# Patient Record
Sex: Female | Born: 1955
Health system: Southern US, Community
[De-identification: ages and names within clinical notes are randomized; demographics above are authoritative.]

## PROBLEM LIST (undated history)

## (undated) DIAGNOSIS — G8929 Other chronic pain: Secondary | ICD-10-CM

## (undated) DIAGNOSIS — E119 Type 2 diabetes mellitus without complications: Secondary | ICD-10-CM

## (undated) DIAGNOSIS — G5603 Carpal tunnel syndrome, bilateral upper limbs: Secondary | ICD-10-CM

## (undated) DIAGNOSIS — Z800 Family history of malignant neoplasm of digestive organs: Secondary | ICD-10-CM

## (undated) DIAGNOSIS — Z8709 Personal history of other diseases of the respiratory system: Secondary | ICD-10-CM

## (undated) DIAGNOSIS — Z801 Family history of malignant neoplasm of trachea, bronchus and lung: Secondary | ICD-10-CM

## (undated) DIAGNOSIS — J40 Bronchitis, not specified as acute or chronic: Secondary | ICD-10-CM

## (undated) DIAGNOSIS — E7800 Pure hypercholesterolemia, unspecified: Secondary | ICD-10-CM

## (undated) DIAGNOSIS — K581 Irritable bowel syndrome with constipation: Secondary | ICD-10-CM

## (undated) DIAGNOSIS — R102 Pelvic and perineal pain: Secondary | ICD-10-CM

## (undated) DIAGNOSIS — Z973 Presence of spectacles and contact lenses: Secondary | ICD-10-CM

## (undated) DIAGNOSIS — J45909 Unspecified asthma, uncomplicated: Secondary | ICD-10-CM

## (undated) DIAGNOSIS — G5600 Carpal tunnel syndrome, unspecified upper limb: Secondary | ICD-10-CM

## (undated) DIAGNOSIS — Z83719 Family history of colon polyps, unspecified: Secondary | ICD-10-CM

## (undated) DIAGNOSIS — K219 Gastro-esophageal reflux disease without esophagitis: Secondary | ICD-10-CM

## (undated) DIAGNOSIS — Z8041 Family history of malignant neoplasm of ovary: Secondary | ICD-10-CM

## (undated) DIAGNOSIS — Z8639 Personal history of other endocrine, nutritional and metabolic disease: Secondary | ICD-10-CM

## (undated) DIAGNOSIS — N83201 Unspecified ovarian cyst, right side: Secondary | ICD-10-CM

## (undated) DIAGNOSIS — E890 Postprocedural hypothyroidism: Secondary | ICD-10-CM

## (undated) DIAGNOSIS — M542 Cervicalgia: Secondary | ICD-10-CM

## (undated) DIAGNOSIS — Z8619 Personal history of other infectious and parasitic diseases: Secondary | ICD-10-CM

## (undated) DIAGNOSIS — Z8371 Family history of colonic polyps: Secondary | ICD-10-CM

## (undated) DIAGNOSIS — M797 Fibromyalgia: Secondary | ICD-10-CM

## (undated) DIAGNOSIS — Z972 Presence of dental prosthetic device (complete) (partial): Secondary | ICD-10-CM

## (undated) DIAGNOSIS — E079 Disorder of thyroid, unspecified: Secondary | ICD-10-CM

## (undated) DIAGNOSIS — Z803 Family history of malignant neoplasm of breast: Secondary | ICD-10-CM

## (undated) DIAGNOSIS — F329 Major depressive disorder, single episode, unspecified: Secondary | ICD-10-CM

## (undated) DIAGNOSIS — E785 Hyperlipidemia, unspecified: Secondary | ICD-10-CM

## (undated) HISTORY — PX: THYROIDECTOMY: SHX17

## (undated) HISTORY — DX: Bronchitis, not specified as acute or chronic: J40

## (undated) HISTORY — DX: Family history of colon polyps, unspecified: Z83.719

## (undated) HISTORY — DX: Family history of malignant neoplasm of breast: Z80.3

## (undated) HISTORY — DX: Gastro-esophageal reflux disease without esophagitis: K21.9

## (undated) HISTORY — DX: Family history of colonic polyps: Z83.71

## (undated) HISTORY — PX: COLONOSCOPY: SHX174

## (undated) HISTORY — DX: Family history of malignant neoplasm of trachea, bronchus and lung: Z80.1

## (undated) HISTORY — DX: Family history of malignant neoplasm of digestive organs: Z80.0

## (undated) HISTORY — PX: UPPER GASTROINTESTINAL ENDOSCOPY: SHX188

## (undated) HISTORY — DX: Family history of malignant neoplasm of ovary: Z80.41

---

## 1997-12-25 ENCOUNTER — Ambulatory Visit (HOSPITAL_COMMUNITY): Admission: RE | Admit: 1997-12-25 | Discharge: 1997-12-25 | Payer: Self-pay | Admitting: Family Medicine

## 1998-06-28 ENCOUNTER — Emergency Department (HOSPITAL_COMMUNITY): Admission: EM | Admit: 1998-06-28 | Discharge: 1998-06-28 | Payer: Self-pay | Admitting: Emergency Medicine

## 1998-07-22 ENCOUNTER — Other Ambulatory Visit: Admission: RE | Admit: 1998-07-22 | Discharge: 1998-07-22 | Payer: Self-pay | Admitting: Obstetrics

## 1998-08-06 ENCOUNTER — Encounter (HOSPITAL_BASED_OUTPATIENT_CLINIC_OR_DEPARTMENT_OTHER): Payer: Self-pay | Admitting: General Surgery

## 1998-08-06 ENCOUNTER — Ambulatory Visit (HOSPITAL_COMMUNITY): Admission: RE | Admit: 1998-08-06 | Discharge: 1998-08-06 | Payer: Self-pay | Admitting: General Surgery

## 1998-11-20 ENCOUNTER — Emergency Department (HOSPITAL_COMMUNITY): Admission: EM | Admit: 1998-11-20 | Discharge: 1998-11-20 | Payer: Self-pay | Admitting: *Deleted

## 1998-12-02 ENCOUNTER — Observation Stay (HOSPITAL_COMMUNITY): Admission: RE | Admit: 1998-12-02 | Discharge: 1998-12-03 | Payer: Self-pay | Admitting: *Deleted

## 1999-01-30 ENCOUNTER — Ambulatory Visit (HOSPITAL_COMMUNITY): Admission: RE | Admit: 1999-01-30 | Discharge: 1999-01-30 | Payer: Self-pay | Admitting: *Deleted

## 1999-01-30 ENCOUNTER — Encounter: Payer: Self-pay | Admitting: *Deleted

## 1999-06-21 ENCOUNTER — Emergency Department (HOSPITAL_COMMUNITY): Admission: EM | Admit: 1999-06-21 | Discharge: 1999-06-21 | Payer: Self-pay | Admitting: Emergency Medicine

## 1999-08-01 ENCOUNTER — Encounter (HOSPITAL_BASED_OUTPATIENT_CLINIC_OR_DEPARTMENT_OTHER): Payer: Self-pay | Admitting: General Surgery

## 1999-08-04 ENCOUNTER — Ambulatory Visit (HOSPITAL_COMMUNITY): Admission: RE | Admit: 1999-08-04 | Discharge: 1999-08-05 | Payer: Self-pay | Admitting: General Surgery

## 1999-08-04 ENCOUNTER — Encounter (INDEPENDENT_AMBULATORY_CARE_PROVIDER_SITE_OTHER): Payer: Self-pay | Admitting: *Deleted

## 1999-11-21 ENCOUNTER — Emergency Department (HOSPITAL_COMMUNITY): Admission: EM | Admit: 1999-11-21 | Discharge: 1999-11-21 | Payer: Self-pay

## 1999-12-08 ENCOUNTER — Ambulatory Visit (HOSPITAL_BASED_OUTPATIENT_CLINIC_OR_DEPARTMENT_OTHER): Admission: RE | Admit: 1999-12-08 | Discharge: 1999-12-08 | Payer: Self-pay

## 2000-03-31 ENCOUNTER — Ambulatory Visit (HOSPITAL_COMMUNITY): Admission: RE | Admit: 2000-03-31 | Discharge: 2000-03-31 | Payer: Self-pay | Admitting: Family Medicine

## 2000-03-31 ENCOUNTER — Encounter: Payer: Self-pay | Admitting: Family Medicine

## 2000-11-05 ENCOUNTER — Ambulatory Visit (HOSPITAL_COMMUNITY): Admission: RE | Admit: 2000-11-05 | Discharge: 2000-11-05 | Payer: Self-pay | Admitting: *Deleted

## 2000-11-16 ENCOUNTER — Ambulatory Visit (HOSPITAL_COMMUNITY): Admission: RE | Admit: 2000-11-16 | Discharge: 2000-11-16 | Payer: Self-pay | Admitting: *Deleted

## 2000-11-16 ENCOUNTER — Encounter: Payer: Self-pay | Admitting: *Deleted

## 2001-10-14 ENCOUNTER — Emergency Department (HOSPITAL_COMMUNITY): Admission: EM | Admit: 2001-10-14 | Discharge: 2001-10-14 | Payer: Self-pay | Admitting: Emergency Medicine

## 2002-02-08 ENCOUNTER — Other Ambulatory Visit: Admission: RE | Admit: 2002-02-08 | Discharge: 2002-02-08 | Payer: Self-pay | Admitting: *Deleted

## 2002-09-30 ENCOUNTER — Emergency Department (HOSPITAL_COMMUNITY): Admission: EM | Admit: 2002-09-30 | Discharge: 2002-09-30 | Payer: Self-pay | Admitting: Emergency Medicine

## 2002-12-11 ENCOUNTER — Emergency Department (HOSPITAL_COMMUNITY): Admission: EM | Admit: 2002-12-11 | Discharge: 2002-12-11 | Payer: Self-pay | Admitting: *Deleted

## 2003-06-20 ENCOUNTER — Encounter: Admission: RE | Admit: 2003-06-20 | Discharge: 2003-06-20 | Payer: Self-pay | Admitting: Internal Medicine

## 2003-06-21 ENCOUNTER — Other Ambulatory Visit: Admission: RE | Admit: 2003-06-21 | Discharge: 2003-06-21 | Payer: Self-pay | Admitting: Family Medicine

## 2004-02-05 ENCOUNTER — Ambulatory Visit: Payer: Self-pay | Admitting: Family Medicine

## 2004-02-13 ENCOUNTER — Ambulatory Visit: Payer: Self-pay | Admitting: Family Medicine

## 2004-02-15 ENCOUNTER — Ambulatory Visit: Payer: Self-pay | Admitting: *Deleted

## 2004-04-28 ENCOUNTER — Ambulatory Visit: Payer: Self-pay | Admitting: Family Medicine

## 2004-05-07 ENCOUNTER — Ambulatory Visit: Payer: Self-pay | Admitting: Family Medicine

## 2004-06-04 ENCOUNTER — Ambulatory Visit: Payer: Self-pay | Admitting: Family Medicine

## 2004-06-13 ENCOUNTER — Ambulatory Visit: Payer: Self-pay | Admitting: Family Medicine

## 2004-07-01 ENCOUNTER — Encounter: Admission: RE | Admit: 2004-07-01 | Discharge: 2004-07-01 | Payer: Self-pay | Admitting: Family Medicine

## 2004-08-05 ENCOUNTER — Ambulatory Visit: Payer: Self-pay | Admitting: Family Medicine

## 2004-10-10 ENCOUNTER — Ambulatory Visit: Payer: Self-pay | Admitting: Family Medicine

## 2004-12-17 ENCOUNTER — Ambulatory Visit: Payer: Self-pay | Admitting: Family Medicine

## 2005-02-08 ENCOUNTER — Emergency Department (HOSPITAL_COMMUNITY): Admission: EM | Admit: 2005-02-08 | Discharge: 2005-02-08 | Payer: Self-pay | Admitting: Emergency Medicine

## 2005-03-23 ENCOUNTER — Ambulatory Visit: Payer: Self-pay | Admitting: Family Medicine

## 2005-03-23 ENCOUNTER — Encounter (INDEPENDENT_AMBULATORY_CARE_PROVIDER_SITE_OTHER): Payer: Self-pay | Admitting: Family Medicine

## 2005-03-23 LAB — CONVERTED CEMR LAB
AST: 16 units/L
BUN: 13 mg/dL
Chloride, Serum: 106 mmol/L
Creatinine, Ser: 1 mg/dL
Potassium, serum: 4.1 mmol/L
Sodium, serum: 140 mmol/L

## 2005-04-08 ENCOUNTER — Ambulatory Visit: Payer: Self-pay | Admitting: Family Medicine

## 2005-05-28 ENCOUNTER — Ambulatory Visit: Payer: Self-pay | Admitting: Family Medicine

## 2005-06-08 ENCOUNTER — Ambulatory Visit: Payer: Self-pay | Admitting: Family Medicine

## 2005-06-30 ENCOUNTER — Encounter (INDEPENDENT_AMBULATORY_CARE_PROVIDER_SITE_OTHER): Payer: Self-pay | Admitting: *Deleted

## 2005-06-30 ENCOUNTER — Ambulatory Visit: Payer: Self-pay | Admitting: Family Medicine

## 2005-10-26 ENCOUNTER — Ambulatory Visit: Payer: Self-pay | Admitting: Sports Medicine

## 2005-12-09 ENCOUNTER — Encounter (INDEPENDENT_AMBULATORY_CARE_PROVIDER_SITE_OTHER): Payer: Self-pay | Admitting: *Deleted

## 2005-12-10 ENCOUNTER — Ambulatory Visit: Payer: Self-pay | Admitting: Family Medicine

## 2005-12-16 ENCOUNTER — Ambulatory Visit (HOSPITAL_COMMUNITY): Admission: RE | Admit: 2005-12-16 | Discharge: 2005-12-16 | Payer: Self-pay | Admitting: Internal Medicine

## 2006-03-22 ENCOUNTER — Ambulatory Visit: Payer: Self-pay | Admitting: Family Medicine

## 2006-05-10 ENCOUNTER — Emergency Department (HOSPITAL_COMMUNITY): Admission: EM | Admit: 2006-05-10 | Discharge: 2006-05-10 | Payer: Self-pay | Admitting: Emergency Medicine

## 2006-05-17 ENCOUNTER — Encounter (INDEPENDENT_AMBULATORY_CARE_PROVIDER_SITE_OTHER): Payer: Self-pay | Admitting: Family Medicine

## 2006-05-17 ENCOUNTER — Ambulatory Visit: Payer: Self-pay | Admitting: Family Medicine

## 2006-06-24 ENCOUNTER — Emergency Department (HOSPITAL_COMMUNITY): Admission: EM | Admit: 2006-06-24 | Discharge: 2006-06-24 | Payer: Self-pay | Admitting: Emergency Medicine

## 2006-07-08 DIAGNOSIS — IMO0001 Reserved for inherently not codable concepts without codable children: Secondary | ICD-10-CM | POA: Insufficient documentation

## 2006-07-08 DIAGNOSIS — K219 Gastro-esophageal reflux disease without esophagitis: Secondary | ICD-10-CM | POA: Insufficient documentation

## 2006-07-08 DIAGNOSIS — E039 Hypothyroidism, unspecified: Secondary | ICD-10-CM | POA: Insufficient documentation

## 2006-07-09 ENCOUNTER — Encounter (INDEPENDENT_AMBULATORY_CARE_PROVIDER_SITE_OTHER): Payer: Self-pay | Admitting: *Deleted

## 2006-08-05 ENCOUNTER — Telehealth: Payer: Self-pay | Admitting: *Deleted

## 2006-08-09 ENCOUNTER — Ambulatory Visit: Payer: Self-pay | Admitting: Family Medicine

## 2006-08-09 DIAGNOSIS — L42 Pityriasis rosea: Secondary | ICD-10-CM | POA: Insufficient documentation

## 2006-09-08 ENCOUNTER — Telehealth (INDEPENDENT_AMBULATORY_CARE_PROVIDER_SITE_OTHER): Payer: Self-pay | Admitting: Family Medicine

## 2006-09-10 ENCOUNTER — Ambulatory Visit: Payer: Self-pay | Admitting: Family Medicine

## 2006-09-10 ENCOUNTER — Encounter (INDEPENDENT_AMBULATORY_CARE_PROVIDER_SITE_OTHER): Payer: Self-pay | Admitting: Family Medicine

## 2006-10-18 ENCOUNTER — Telehealth: Payer: Self-pay | Admitting: *Deleted

## 2006-10-20 ENCOUNTER — Ambulatory Visit: Payer: Self-pay | Admitting: Family Medicine

## 2006-11-01 ENCOUNTER — Encounter (INDEPENDENT_AMBULATORY_CARE_PROVIDER_SITE_OTHER): Payer: Self-pay | Admitting: Family Medicine

## 2006-12-08 ENCOUNTER — Telehealth: Payer: Self-pay | Admitting: *Deleted

## 2006-12-09 ENCOUNTER — Ambulatory Visit: Payer: Self-pay | Admitting: Family Medicine

## 2006-12-09 LAB — CONVERTED CEMR LAB
Nitrite: NEGATIVE
Protein, U semiquant: 30
Urobilinogen, UA: 0.2
WBC Urine, dipstick: NEGATIVE

## 2006-12-10 ENCOUNTER — Encounter: Payer: Self-pay | Admitting: Family Medicine

## 2006-12-11 ENCOUNTER — Encounter: Payer: Self-pay | Admitting: Family Medicine

## 2007-03-22 ENCOUNTER — Encounter (INDEPENDENT_AMBULATORY_CARE_PROVIDER_SITE_OTHER): Payer: Self-pay | Admitting: Family Medicine

## 2007-03-29 ENCOUNTER — Encounter (INDEPENDENT_AMBULATORY_CARE_PROVIDER_SITE_OTHER): Payer: Self-pay | Admitting: Family Medicine

## 2007-04-15 ENCOUNTER — Ambulatory Visit (HOSPITAL_COMMUNITY): Admission: RE | Admit: 2007-04-15 | Discharge: 2007-04-15 | Payer: Self-pay | Admitting: Family Medicine

## 2007-04-15 ENCOUNTER — Encounter (INDEPENDENT_AMBULATORY_CARE_PROVIDER_SITE_OTHER): Payer: Self-pay | Admitting: *Deleted

## 2007-04-15 ENCOUNTER — Ambulatory Visit: Payer: Self-pay | Admitting: Family Medicine

## 2007-04-15 DIAGNOSIS — K921 Melena: Secondary | ICD-10-CM | POA: Insufficient documentation

## 2007-04-16 ENCOUNTER — Encounter (INDEPENDENT_AMBULATORY_CARE_PROVIDER_SITE_OTHER): Payer: Self-pay | Admitting: *Deleted

## 2007-04-16 LAB — CONVERTED CEMR LAB
Hemoglobin: 14.5 g/dL (ref 12.0–15.0)
MCHC: 31.7 g/dL (ref 30.0–36.0)
MCV: 90 fL (ref 78.0–100.0)
RBC: 5.09 M/uL (ref 3.87–5.11)
TSH: 0.618 microintl units/mL (ref 0.350–5.50)
WBC: 9.3 10*3/uL (ref 4.0–10.5)

## 2007-04-18 ENCOUNTER — Telehealth: Payer: Self-pay | Admitting: *Deleted

## 2007-04-19 ENCOUNTER — Encounter (INDEPENDENT_AMBULATORY_CARE_PROVIDER_SITE_OTHER): Payer: Self-pay | Admitting: Family Medicine

## 2007-04-22 ENCOUNTER — Ambulatory Visit: Payer: Self-pay

## 2007-04-22 ENCOUNTER — Ambulatory Visit: Payer: Self-pay | Admitting: Family Medicine

## 2007-04-22 DIAGNOSIS — Z860100 Personal history of colon polyps, unspecified: Secondary | ICD-10-CM | POA: Insufficient documentation

## 2007-04-22 DIAGNOSIS — K625 Hemorrhage of anus and rectum: Secondary | ICD-10-CM | POA: Insufficient documentation

## 2007-04-27 ENCOUNTER — Ambulatory Visit (HOSPITAL_COMMUNITY): Admission: RE | Admit: 2007-04-27 | Discharge: 2007-04-27 | Payer: Self-pay | Admitting: Family Medicine

## 2007-04-29 ENCOUNTER — Encounter (INDEPENDENT_AMBULATORY_CARE_PROVIDER_SITE_OTHER): Payer: Self-pay | Admitting: Family Medicine

## 2007-05-27 ENCOUNTER — Ambulatory Visit: Payer: Self-pay | Admitting: Family Medicine

## 2007-05-27 ENCOUNTER — Encounter: Payer: Self-pay | Admitting: Family Medicine

## 2007-05-27 LAB — CONVERTED CEMR LAB
Glucose, Urine, Semiquant: NEGATIVE
Ketones, urine, test strip: NEGATIVE
Nitrite: NEGATIVE
Specific Gravity, Urine: 1.025
WBC Urine, dipstick: NEGATIVE
pH: 5.5

## 2007-09-09 ENCOUNTER — Emergency Department (HOSPITAL_COMMUNITY): Admission: EM | Admit: 2007-09-09 | Discharge: 2007-09-09 | Payer: Self-pay | Admitting: Emergency Medicine

## 2008-01-17 ENCOUNTER — Telehealth: Payer: Self-pay | Admitting: *Deleted

## 2008-01-17 ENCOUNTER — Ambulatory Visit: Payer: Self-pay | Admitting: Family Medicine

## 2008-01-19 ENCOUNTER — Telehealth: Payer: Self-pay | Admitting: Family Medicine

## 2008-03-26 ENCOUNTER — Telehealth: Payer: Self-pay | Admitting: *Deleted

## 2008-03-26 ENCOUNTER — Ambulatory Visit: Payer: Self-pay | Admitting: Family Medicine

## 2008-03-26 ENCOUNTER — Encounter: Payer: Self-pay | Admitting: Family Medicine

## 2008-03-26 LAB — CONVERTED CEMR LAB
Bilirubin Urine: NEGATIVE
Chlamydia, DNA Probe: NEGATIVE
Glucose, Urine, Semiquant: NEGATIVE
Protein, U semiquant: NEGATIVE
Urobilinogen, UA: 0.2
pH: 5.5

## 2008-04-20 ENCOUNTER — Telehealth: Payer: Self-pay | Admitting: *Deleted

## 2008-04-23 ENCOUNTER — Telehealth: Payer: Self-pay | Admitting: *Deleted

## 2008-05-21 ENCOUNTER — Ambulatory Visit (HOSPITAL_COMMUNITY): Admission: RE | Admit: 2008-05-21 | Discharge: 2008-05-21 | Payer: Self-pay | Admitting: Family Medicine

## 2008-05-23 ENCOUNTER — Telehealth (INDEPENDENT_AMBULATORY_CARE_PROVIDER_SITE_OTHER): Payer: Self-pay | Admitting: *Deleted

## 2008-05-25 ENCOUNTER — Encounter: Admission: RE | Admit: 2008-05-25 | Discharge: 2008-05-25 | Payer: Self-pay | Admitting: Family Medicine

## 2008-05-27 ENCOUNTER — Emergency Department (HOSPITAL_COMMUNITY): Admission: EM | Admit: 2008-05-27 | Discharge: 2008-05-27 | Payer: Self-pay | Admitting: Emergency Medicine

## 2008-05-28 ENCOUNTER — Encounter: Admission: RE | Admit: 2008-05-28 | Discharge: 2008-05-28 | Payer: Self-pay | Admitting: Family Medicine

## 2008-06-27 ENCOUNTER — Ambulatory Visit: Payer: Self-pay | Admitting: Family Medicine

## 2008-07-11 ENCOUNTER — Encounter: Payer: Self-pay | Admitting: Internal Medicine

## 2008-10-23 ENCOUNTER — Telehealth: Payer: Self-pay | Admitting: *Deleted

## 2008-10-26 ENCOUNTER — Ambulatory Visit: Payer: Self-pay | Admitting: Family Medicine

## 2008-10-26 ENCOUNTER — Encounter (INDEPENDENT_AMBULATORY_CARE_PROVIDER_SITE_OTHER): Payer: Self-pay | Admitting: Family Medicine

## 2008-10-30 LAB — CONVERTED CEMR LAB
AST: 19 units/L (ref 0–37)
Albumin: 3.9 g/dL (ref 3.5–5.2)
BUN: 13 mg/dL (ref 6–23)
CO2: 25 meq/L (ref 19–32)
Calcium: 9.3 mg/dL (ref 8.4–10.5)
Chloride: 108 meq/L (ref 96–112)
Cholesterol: 258 mg/dL — ABNORMAL HIGH (ref 0–200)
Glucose, Bld: 126 mg/dL — ABNORMAL HIGH (ref 70–99)
HDL: 46 mg/dL (ref >39)
Potassium: 4.5 meq/L (ref 3.5–5.3)
TSH: 14.642 microintl units/mL — ABNORMAL HIGH (ref 0.350–4.500)

## 2008-11-05 ENCOUNTER — Telehealth: Payer: Self-pay | Admitting: Family Medicine

## 2008-11-14 ENCOUNTER — Telehealth: Payer: Self-pay | Admitting: *Deleted

## 2008-12-14 ENCOUNTER — Encounter: Payer: Self-pay | Admitting: Family Medicine

## 2008-12-14 ENCOUNTER — Ambulatory Visit: Payer: Self-pay | Admitting: Family Medicine

## 2008-12-14 DIAGNOSIS — E119 Type 2 diabetes mellitus without complications: Secondary | ICD-10-CM | POA: Insufficient documentation

## 2008-12-14 DIAGNOSIS — E785 Hyperlipidemia, unspecified: Secondary | ICD-10-CM | POA: Insufficient documentation

## 2008-12-14 DIAGNOSIS — R0602 Shortness of breath: Secondary | ICD-10-CM | POA: Insufficient documentation

## 2008-12-15 ENCOUNTER — Telehealth: Payer: Self-pay | Admitting: Family Medicine

## 2008-12-17 ENCOUNTER — Telehealth: Payer: Self-pay | Admitting: Family Medicine

## 2009-02-24 ENCOUNTER — Emergency Department (HOSPITAL_COMMUNITY): Admission: EM | Admit: 2009-02-24 | Discharge: 2009-02-24 | Payer: Self-pay | Admitting: Emergency Medicine

## 2009-03-07 ENCOUNTER — Telehealth: Payer: Self-pay | Admitting: *Deleted

## 2009-03-08 ENCOUNTER — Ambulatory Visit: Payer: Self-pay | Admitting: Family Medicine

## 2009-03-20 ENCOUNTER — Encounter: Payer: Self-pay | Admitting: Family Medicine

## 2009-05-13 ENCOUNTER — Encounter (INDEPENDENT_AMBULATORY_CARE_PROVIDER_SITE_OTHER): Payer: Self-pay | Admitting: *Deleted

## 2009-05-13 DIAGNOSIS — Z720 Tobacco use: Secondary | ICD-10-CM | POA: Insufficient documentation

## 2009-05-13 DIAGNOSIS — F17200 Nicotine dependence, unspecified, uncomplicated: Secondary | ICD-10-CM | POA: Insufficient documentation

## 2009-05-17 ENCOUNTER — Ambulatory Visit (HOSPITAL_COMMUNITY): Admission: RE | Admit: 2009-05-17 | Discharge: 2009-05-17 | Payer: Self-pay | Admitting: Family Medicine

## 2009-05-17 ENCOUNTER — Encounter: Payer: Self-pay | Admitting: Family Medicine

## 2009-05-17 ENCOUNTER — Ambulatory Visit: Payer: Self-pay | Admitting: Family Medicine

## 2009-05-17 DIAGNOSIS — M25529 Pain in unspecified elbow: Secondary | ICD-10-CM | POA: Insufficient documentation

## 2009-05-17 LAB — CONVERTED CEMR LAB: Hgb A1c MFr Bld: 7 %

## 2009-05-20 ENCOUNTER — Ambulatory Visit: Payer: Self-pay | Admitting: Family Medicine

## 2009-05-20 ENCOUNTER — Encounter: Payer: Self-pay | Admitting: Family Medicine

## 2009-05-26 ENCOUNTER — Encounter: Payer: Self-pay | Admitting: Family Medicine

## 2009-07-30 ENCOUNTER — Telehealth: Payer: Self-pay | Admitting: Family Medicine

## 2009-08-23 ENCOUNTER — Encounter: Payer: Self-pay | Admitting: Family Medicine

## 2009-08-23 ENCOUNTER — Ambulatory Visit: Payer: Self-pay | Admitting: Family Medicine

## 2009-08-23 DIAGNOSIS — M542 Cervicalgia: Secondary | ICD-10-CM | POA: Insufficient documentation

## 2009-08-23 DIAGNOSIS — M79609 Pain in unspecified limb: Secondary | ICD-10-CM | POA: Insufficient documentation

## 2009-08-23 LAB — CONVERTED CEMR LAB
ALT: 22 units/L (ref 0–35)
AST: 15 units/L (ref 0–37)
Bilirubin, Direct: 0.1 mg/dL (ref 0.0–0.3)
Direct LDL: 124 mg/dL — ABNORMAL HIGH
Indirect Bilirubin: 0.2 mg/dL (ref 0.0–0.9)

## 2009-08-26 ENCOUNTER — Encounter: Payer: Self-pay | Admitting: Family Medicine

## 2009-10-25 ENCOUNTER — Ambulatory Visit: Payer: Self-pay | Admitting: Family Medicine

## 2009-10-25 ENCOUNTER — Encounter: Payer: Self-pay | Admitting: Family Medicine

## 2009-10-25 DIAGNOSIS — R300 Dysuria: Secondary | ICD-10-CM | POA: Insufficient documentation

## 2009-10-25 DIAGNOSIS — R1011 Right upper quadrant pain: Secondary | ICD-10-CM | POA: Insufficient documentation

## 2009-10-25 LAB — CONVERTED CEMR LAB
Nitrite: NEGATIVE
WBC Urine, dipstick: NEGATIVE

## 2009-10-31 ENCOUNTER — Encounter: Payer: Self-pay | Admitting: Family Medicine

## 2009-10-31 LAB — CONVERTED CEMR LAB
AST: 15 units/L (ref 0–37)
Albumin: 4.6 g/dL (ref 3.5–5.2)
Alkaline Phosphatase: 64 units/L (ref 39–117)
Potassium: 4.6 meq/L (ref 3.5–5.3)
Sodium: 140 meq/L (ref 135–145)
Total Protein: 7.5 g/dL (ref 6.0–8.3)

## 2009-11-21 ENCOUNTER — Encounter: Payer: Self-pay | Admitting: Family Medicine

## 2010-06-08 LAB — CONVERTED CEMR LAB
Cholesterol: 257 mg/dL — ABNORMAL HIGH (ref 0–200)
Total CHOL/HDL Ratio: 5.5
Triglycerides: 127 mg/dL (ref <150)

## 2010-06-12 NOTE — Assessment & Plan Note (Signed)
Summary: ulnar elbow/hand pain, DM, SOB, etc...   Vital Signs:  Patient profile:   55 year old female Weight:      166.63 pounds Temp:     97.9 degrees F oral Pulse rate:   77 / minute Pulse rhythm:   regular BP sitting:   121 / 84  (right arm) Cuff size:   regular  Vitals Entered By: Loralee Pacas CMA (May 17, 2009 11:14 AM) CC: ulnar elbow/hand pain bilaterally, DM, COPD, Neuropathy  Pain Assessment Patient in pain? yes     Location: chest Intensity: 5 Type: burning Onset of pain  Gradual Comments pt states she's been having shooting pains that start in finger tips, into her shoulder and goes into her chest. no heavy lifting or strenuous exercise, no stressful situations.     Primary Care Provider:  Delbert Harness MD  CC:  ulnar elbow/hand pain bilaterally, DM, COPD, and Neuropathy .  History of Present Illness: Ulnar elbow/wrist pain bilaterlly: Pt has bilateal ulnar sided elbow and hand pain. 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 she has noticed some tingling in her fingers, some ulnar pain in her elbow, some pain in her 4th and 5th fingers. She says it is worse when she lifts her hands above her head to work with her hair. She also says it has woken her up at night. She has used ice pack Biofreeze and warm water soaks.   DM2: Pt has been taking Metformin once daily and says even then she forgets to take it sometimes. She is not checking her CBG's regularily. She says she forgets she has DM most of the time until she starts to feel bad and then she takes a Metformin pill.   SOB: Pt is still smokoing about 1/2 ppd. She has a h/o COPDShe is having some SOB occasionally and has to use the inhaler. She uses her inhaler about 2-3x/week. she was couseled on smoking but is not ready to start the smoking cessation class yet.    Fibromyalgia and Neuopathic pain: Pt has some symptoms of neurpathic pain. She has some burning pains in her left trapezious  and along her hair line. She also has fibromyalgia pains that are supose to be treated with the Neurontin. She does not take it regularily.   Habits & Providers  Alcohol-Tobacco-Diet     Tobacco Status: current     Tobacco Counseling: to quit use of tobacco products     Cigarette Packs/Day: 0.5  Current Medications (verified): 1)  Levothroid 175 Mcg Tabs (Levothyroxine Sodium) .Marland Kitchen.. 1 Tablet By Mouth Once A Day, Take 30 Minutes Before Food or Other Meds. 2)  Ranitidine Hcl 150 Mg Caps (Ranitidine Hcl) .... Take One Pill Twice Daily 3)  Ventolin Hfa 108 (90 Base) Mcg/act Aers (Albuterol Sulfate) .... Sig: 2 Puffs By Mouth Every 4-6 Hours As Needed Disp 1 Hfa 4)  Neurontin 100 Mg Caps (Gabapentin) .Marland Kitchen.. 1 Tablet By Mouth At Bedtime For Nerve Pain 5)  Metformin Hcl 500 Mg Tabs (Metformin Hcl) .... Take One Pill With Breakfast and One With Dinner 6)  Itch Relief 2 % Crea (Diphenhydramine Hcl) .... Apply Cream To Ichy Area of Skin 1 Large Tube 7)  Ibuprofen 600 Mg Tabs (Ibuprofen) .... Take One Pill Every 6 Hours To Decrease Inflamation in Your Joints.  Allergies (verified): 1)  Sulfamethoxazole (Sulfamethoxazole)  Review of Systems        vitals reviewed and pertinent negatives and positives  seen in HPI   Physical Exam  General:  Well-developed,well-nourished,in no acute distress; alert,appropriate and cooperative throughout examination Head:  hair under wig is broken but tightly braided into rows.  Lungs:  Normal respiratory effort, chest expands symmetrically. Lungs are clear to auscultation, no crackles or wheezes. Heart:  Normal rate and regular rhythm. S1 and S2 normal without gallop, murmur, click, rub or other extra sounds. Extremities:  tenderness at elbow on medial side bilaterally  Psych:  Cognition and judgment appear intact. Alert and cooperative with normal attention span and concentration. No apparent delusions, illusions, hallucinations   Impression &  Recommendations:  Problem # 1:  ELBOW PAIN, BILATERAL (ICD-719.42) Assessment New Pt is to wrap her elbows in ACE bandages (give to her from our clinic) at night to keep her arms straight so she does not compress them by bending them at night. Pt is to also take Ibuprofen 600 mg daily to decrease inflammation. I will see her in a month or two.   Orders: [facility]- Est  Level 4 ([address])  Problem # 2:  DIABETES MELLITUS, TYPE II, WITHOUT COMPLICATIONS (ICD-250.00) Assessment: Deteriorated Pt as not been taking metformin as prescribed and her A1c is 7.0 (was 6.9) so it is slightly worse. She was encouraged to take her metformin regulalriy.   Her updated medication list for this problem includes:    Metformin Hcl 500 Mg Tabs (Metformin hcl) .Marland Kitchen... Take one pill with breakfast and one with dinner  Orders: A1C-[facility] ([address]) [facility]- Est  Level 4 ([address]214)  Problem # 3:  SHORTNESS OF BREATH (ICD-786.05) Assessment: Unchanged Pt says that she has SOB about 3 times a week and has to use the inhaler. She had an EKG that was normal. She got a refill on her Albuterol   Orders: EKG- [facility] (EKG) [facility]- Est  Level 4 ([address])  Problem # 4:  FIBROMYALGIA, FIBROMYOSITIS (ICD-729.1) Assessment: Deteriorated Pt says that she has burning pain in her left trapezius that comes and goes. She also has pain in her rt leg and some burning pain around her face. These are unusual symptoms but I encouraged pt to use the Neurontin as prescribed.   The following medications were removed from the medication list:    Ibu 800 Mg Tabs (Ibuprofen) .Marland Kitchen... 1 tab by mouth three times a day as needed pain Her updated medication list for this problem includes:    Ibuprofen 600 Mg Tabs (Ibuprofen) .Marland Kitchen... Take one pill every 6 hours to decrease inflamation in your joints.  Orders: [facility]- Est  Level 4 ([address])  Problem # 5:  TOBACCO USER (ICD-305.1) pt continues to smoke. Encouraged to go to smoking cessation classes.   Problem # 6:   DYSLIPIDEMIA (ICD-272.4) Assessment: Comment Only Pt encouaged to come back to get a FLP and if her LDL is still elevated pt knows we will start a STatin at that time.   Orders: [facility]- Est  Level 4 ([address])Future Orders: Lipid-[facility] ([id number]) ... 05/28/2010  Complete Medication List: 1)  Levothroid 175 Mcg Tabs (Levothyroxine sodium) .Marland Kitchen.. 1 tablet by mouth once a day, take 30 minutes before food or other meds. 2)  Ranitidine Hcl 150 Mg Caps (Ranitidine hcl) .... Take one pill twice daily 3)  Ventolin Hfa 108 (90 Base) Mcg/act Aers (Albuterol sulfate) .... Sig: 2 puffs by mouth every 4-6 hours as needed disp 1 hfa 4)  Neurontin 100 Mg Caps (Gabapentin) .Marland Kitchen.. 1 tablet by mouth at bedtime for nerve pain 5)  Metformin Hcl 500 Mg  Tabs (Metformin hcl) .... Take one pill with breakfast and one with dinner 6)  Itch Relief 2 % Crea (Diphenhydramine hcl) .... Apply cream to ichy area of skin 1 large tube 7)  Ibuprofen 600 Mg Tabs (Ibuprofen) .... Take one pill every 6 hours to decrease inflamation in your joints.  Other Orders: EMR Misc Charge Code [facility])  Patient Instructions: 1)  It was nice to meet you today. 2)  It sounds like you have some Ulnar nerve compression. To relieve the compression I advise wrapping both arms in ACE bandages at night to keep the arms straight.  3)  Use the Ibuprofen every 6 hous to decrease inflammation. 4)  Use Biotin 10 mg (find at health food store) to help hair grow back healthy and strong. 5)  Use Benadryl for itching and cream as topical itching aid. 6)  Use Metformin twice daily. Prescriptions: IBUPROFEN 600 MG TABS (IBUPROFEN) take one pill every 6 hours to decrease inflamation in your joints.  #120 x 5   Entered and Authorized by:   Jamie Brookes MD   Signed by:   Jamie Brookes MD on 05/17/2009   Method used:   Electronically to        [facility] (retail)       [address]       [place], [place]  [address]        Ph: [phone]       Fax: [phone]   RxID:   [id number] [facility] RELIEF 2 % CREA (DIPHENHYDRAMINE HCL) apply cream to ichy area of skin 1 large tube  #1 x 1   Entered and Authorized by:   Jamie Brookes MD   Signed by:   Jamie Brookes MD on 05/17/2009   Method used:   Electronically to        [facility] (retail)       [address]       [place], [place]  [address]       Ph: [phone]       Fax: [phone]   RxID:   [id number] NEURONTIN 100 MG CAPS (GABAPENTIN) 1 tablet by mouth at bedtime for nerve pain  #34 x 3   Entered and Authorized by:   Jamie Brookes MD   Signed by:   Jamie Brookes MD on 05/17/2009   Method used:   Electronically to        [facility] (retail)       [address]       [place], [place]  [address]       Ph: [phone]       Fax: [phone]   RxID:   [id number] LEVOTHROID 175 MCG TABS (LEVOTHYROXINE SODIUM) 1 tablet by mouth once a day, take 30 minutes before food or other meds.  #34 x 11   Entered and Authorized by:   Jamie Brookes MD   Signed by:   Jamie Brookes MD on 05/17/2009   Method used:   [facility]        [facility]       [address]       [place], [place]  [address]       Ph: [phone]       Fax:  [phone]   RxID:   [id number] RANITIDINE HCL 150 MG CAPS (RANITIDINE HCL) take one pill twice daily  #64 x 11   Entered and Authorized by:   Jamie Brookes MD   Signed by:   Jamie Brookes MD on 05/17/2009   Method used:   Electronically to        [facility] (retail)       [address]       [place], [place]  [address]       Ph: [phone]       Fax: [phone]   RxID:   [id number] METFORMIN HCL 500 MG TABS (METFORMIN HCL) Take one pill with breakfast and one with dinner  #64 x 3   Entered and Authorized by:   Jamie Brookes MD   Signed by:   Jamie Brookes MD  on 05/17/2009   Method used:   Electronically to        [facility] (retail)       [address]       [place], [place]  [address]       Ph: [phone]       Fax: [phone]   RxID:   [id number] VENTOLIN HFA 108 (90 BASE) MCG/ACT AERS (ALBUTEROL SULFATE) SIG: 2 puffs by mouth every 4-6 hours as needed DISP 1 HFA  #1 x 1   Entered and Authorized by:   Jamie Brookes MD   Signed by:   Jamie Brookes MD on 05/17/2009   Method used:   Electronically to        [facility] (retail)       [address]       [place], [place]  [address]       Ph: [phone]       Fax: [phone]   RxID:   [id number]   Laboratory Results   Blood Tests   Date/Time Received: May 17, 2009 11:22 AM  Date/Time Reported: May 17, 2009 11:31 AM   HGBA1C: 7.0%   (Normal Range: Non-Diabetic - 3-6%   Control Diabetic - 6-8%)  Comments: ...........test performed by...........Marland KitchenTerese Door, CMA

## 2010-06-12 NOTE — Miscellaneous (Signed)
Summary: changing practices   Clinical Lists Changes  rec'd medical records request to be sent to [facility]. pt no longer coming here. De Nurse  November 21, 2009 9:10 AM

## 2010-06-12 NOTE — Letter (Signed)
Summary: Out of Work  [facility]  [address]   [place], [place] [address]   Phone: [phone]  Fax: [phone]    May 17, 2009   Employee:  Joliana Melby    To Whom It May Concern:   For Medical reasons, please excuse the above named employee from work for the following dates:  Start:   May 17, 2009  End:   May 17, 2009  If you need additional information, please feel free to contact our office.         Sincerely,    Jamie Brookes MD

## 2010-06-12 NOTE — Miscellaneous (Signed)
Summary: [name]  Clinical Lists Changes  Problems: Added new problem of TOBACCO Sekai Nayak (ICD-305.1) 

## 2010-06-12 NOTE — Assessment & Plan Note (Signed)
Summary: pain right side,tcb   Vital Signs:  Patient profile:   55 year old female Height:      63.5 inches Weight:      166.9 pounds BMI:     29.21 Temp:     98.3 degrees F oral Pulse rate:   91 / minute BP sitting:   131 / 85  (left arm) Cuff size:   large  Vitals Entered By: Gladstone Pih (October 25, 2009 11:00 AM) CC: C/O right side back going from abd to back area, freq urination Is Patient Diabetic? Yes Did you bring your meter with you today? No Pain Assessment Patient in pain? yes      Onset of pain  right side pain,    Primary Care Provider:  Delbert Harness MD  CC:  C/O right side back going from abd to back area and freq urination.  History of Present Illness: 55 yo with DM, HTN, fibromyalgia here for work-in appt for abdominal pain  Patient has noticed some mild, constant abdominal pain for the past week.  Has not had pain like this before.  Described as right sided, radiating to her back.  No association with meals, laying down.  Has noticed some abdominal bloating and changes in stools.  Her last bowel movement was 2 days ago although she does not feel she is constipated.  Has noted some darker colored stools as well.  States she has also had some diarrhea but unclear as to if this is associated with pain this past week.  No recent change in physical activity, no injury, and pain not elicited by any activity.  ROS positive for polyuria but no fever, nausea, vomiting, dysuria.  Of  note patient states she has not taken her metformin for the past week due as she says she has not been able to afford refills.  Habits & Providers  Alcohol-Tobacco-Diet     Tobacco Status: current     Tobacco Counseling: to quit use of tobacco products     Cigarette Packs/Day: 0.25  Current Medications (verified): 1)  Levothroid 175 Mcg Tabs (Levothyroxine Sodium) .Marland Kitchen.. 1 Tablet By Mouth Once A Day, Take 30 Minutes Before Food or Other Meds. 2)  Ranitidine Hcl 150 Mg Caps (Ranitidine  Hcl) .... Take One Pill Twice Daily 3)  Ventolin Hfa 108 (90 Base) Mcg/act Aers (Albuterol Sulfate) .... Sig: 2 Puffs By Mouth Every 4-6 Hours As Needed Disp 1 Hfa 4)  Gabapentin 300 Mg Caps (Gabapentin) .... One At Bedtime 5)  Metformin Hcl 500 Mg Tabs (Metformin Hcl) .... Take One Pill With Breakfast and One With Dinner 6)  Itch Relief 2 % Crea (Diphenhydramine Hcl) .... Apply Cream To Ichy Area of Skin 1 Large Tube 7)  Ibuprofen 600 Mg Tabs (Ibuprofen) .... Take One Pill Every 6 Hours To Decrease Inflamation in Your Joints. 8)  Simvastatin 40 Mg Tabs (Simvastatin) .... Take One Tablet Daily 9)  Capsaicin 0.075 % Crea (Capsaicin) .... Apply To Affected Joints 3 To 4 Times A Day. Rub in Completely. Give One Architectural technologist. 10)  Flexeril 10 Mg Tabs (Cyclobenzaprine Hcl) .... One Two Times A Day As Needed Muscle Pain  Allergies: 1)  Sulfamethoxazole (Sulfamethoxazole) PMH-FH-SH reviewed-no changes except otherwise noted  Review of Systems      See HPI General:  Denies fever and weight loss. CV:  Denies chest pain or discomfort and swelling of feet. Resp:  Denies shortness of breath. GI:  Complains of abdominal pain, change in bowel  habits, and diarrhea; denies constipation, gas, indigestion, loss of appetite, nausea, and vomiting. GU:  Complains of urinary frequency; denies dysuria. Derm:  Denies changes in color of skin.  Physical Exam  General:  In no acute distress.  anicteric Lungs:  Normal respiratory effort, chest expands symmetrically. Lungs are clear to auscultation, no crackles or wheezes. Heart:  Normal rate and regular rhythm. S1 and S2 normal without gallop, murmur, click, rub or other extra sounds. Abdomen:  Bowel sounds positive, abdomen soft and mildy tender in right upper and lower quadrant.  No pelvic pain.  No rebound or guarding.  Without masses, organomegaly  No flank pain Extremities:  no LE edema   Impression & Recommendations:  Problem # 1:  ABDOMINAL PAIN,  RIGHT UPPER QUADRANT (ICD-789.01) urinalysis does not suggest UTI.  Patient's history is non-specific.  Will draw CMP, Lipase given localization and histoy of recent non-compliance with Metformin.  Also possibly due to constipation.  Does not seem infectious in etiology.  If pain continues or worsens, patient dvised to return for further evaluation.  If labs normal, I would pursue further with pelvic exam/STD screening.  Orders: Comp Met-[facility] [id number]) Lipase-[facility] [id number]) TSH-[facility] [id number]) [facility]- Est Level  3 ([address])  Complete Medication List: 1)  Levothroid 175 Mcg Tabs (Levothyroxine sodium) .Marland Kitchen.. 1 tablet by mouth once a day, take 30 minutes before food or other meds. 2)  Ranitidine Hcl 150 Mg Caps (Ranitidine hcl) .... Take one pill twice daily 3)  Ventolin Hfa 108 (90 Base) Mcg/act Aers (Albuterol sulfate) .... Sig: 2 puffs by mouth every 4-6 hours as needed disp 1 hfa 4)  Gabapentin 300 Mg Caps (Gabapentin) .... One at bedtime 5)  Metformin Hcl 500 Mg Tabs (Metformin hcl) .... Take one pill with breakfast and one with dinner 6)  Itch Relief 2 % Crea (Diphenhydramine hcl) .... Apply cream to ichy area of skin 1 large tube 7)  Ibuprofen 600 Mg Tabs (Ibuprofen) .... Take one pill every 6 hours to decrease inflamation in your joints. 8)  Simvastatin 40 Mg Tabs (Simvastatin) .... Take one tablet daily 9)  Capsaicin 0.075 % Crea (Capsaicin) .... Apply to affected joints 3 to 4 times a day. rub in completely. give one large container. 10)  Flexeril 10 Mg Tabs (Cyclobenzaprine hcl) .... One two times a day as needed muscle pain  Other Orders: Urinalysis-[facility] (00000)  Patient Instructions: 1)  Will check labwork to check on your gallbladder, liver, and pancreas as causes for your abdominal pain. 2)  Call your GI doctor and schedule a checkup/colonoscopy to look for causes of dark stools.  We would be happy to send labwork there if you let us know which office. 3)  If you continue to  have pain, return for re-evaluation in 1 week. 4)  Make appointmetn to check up on your diabetes with Dr. Clotilde Dieter.   Laboratory Results   Urine Tests  Date/Time Received: October 25, 2009 11:11 AM  Date/Time Reported: October 25, 2009 11:40 AM   Routine Urinalysis   Color: yellow Appearance: Clear Glucose: negative   (Normal Range: Negative) Bilirubin: negative   (Normal Range: Negative) Ketone: negative   (Normal Range: Negative) Spec. Gravity: 1.025   (Normal Range: 1.003-1.035) Blood: small   (Normal Range: Negative) pH: 5.0   (Normal Range: 5.0-8.0) Protein: trace   (Normal Range: Negative) Urobilinogen: 0.2   (Normal Range: 0-1) Nitrite: negative   (Normal Range: Negative) Leukocyte Esterace: negative   (Normal Range: Negative)  Urine Microscopic  WBC/HPF: 0-2 RBC/HPF: 0-2 Bacteria/HPF: trace Mucous/HPF: trace Epithelial/HPF: 1-5    Comments: ...........test performed by...........Marland KitchenTerese Door, CMA

## 2010-06-12 NOTE — Letter (Signed)
Summary: Results Follow-up Letter: no phone  [facility]  [address]   [place], [place] [address]   Phone: [phone]  Fax: [phone]    10/31/2009  [address] [place], [place]  [address]  Dear Ms. [place],   I am unable to contact you by telephone.  Please call our office to give [place] an updated phone number.  Your labwork is normal and shows no abnormalities with your liver, gallbladder, or pancreas.  Your thyroid test shows that your thyroid level is too low.  If you have not been taking your medication regularly, please do so, if not please call the office as you will need a higher dose.  If you continue to have abdominal pain, please schedule a follow-up ASAP.   Sincerely,  Delbert Harness MD [facility]           Appended Document: Results Follow-up Letter: no phone MAILED.

## 2010-06-12 NOTE — Letter (Signed)
Summary: Out of Work  [facility]  [address]   [place], [place] [address]   Phone: [phone]  Fax: [phone]    August 23, 2009   Employee:  Ruweyda Grable    To Whom It May Concern:   For Medical reasons, please excuse the above named employee from work for the following dates:  Start:   4/15  End:   4/18 seen for neck pain and plan establisthed  If you need additional information, please feel free to contact our office.         Sincerely,    Luretha Murphy NP

## 2010-06-12 NOTE — Letter (Signed)
Summary: Results Follow-up Letter  [facility]  [address]   [place], [place] [address]   Phone: [phone]  Fax: [phone]    08/26/2009  [address] [place], [place]  [address]  Dear Ms. Earp,   The following are the results of your recent test(s): _________________________________________________________ Cholesterol LDL(Bad cholesterol):     578     Your goal is less than:     100    Your cholesterol is better since last labs (was 185 and now 124). Keep taking the Levothyroxine and the Simvastatin. Decrease the amount of animal products in your diet (eggs, cheese, meat, fish, etc...) and I believe you will be able to get this cholesterol under control soon. Come back in 6 months and lets recheck your cholesterol.    Sincerely,  Jamie Brookes MD [facility]           Appended Document: Results Follow-up Letter mailed

## 2010-06-12 NOTE — Assessment & Plan Note (Signed)
Summary: pain in left side shoulder/arm/hand,tcb   Vital Signs:  Patient profile:   55 year old female Height:      63.5 inches Weight:      170 pounds BMI:     29.75 Temp:     97.7 degrees F oral Pulse rate:   87 / minute BP sitting:   118 / 79  (left arm) Cuff size:   regular  Vitals Entered By: Tessie Fass CMA (August 23, 2009 10:56 AM) CC: neck pain radiating down left shoulder Is Patient Diabetic? Yes Pain Assessment Patient in pain? yes     Location: left shoulder and neck Intensity: 10   Primary Care Provider:  Delbert Harness MD  CC:  neck pain radiating down left shoulder.  History of Present Illness: Left sided neck pain for one week, just came on all of a sudden.  Keeping her awake at night, uncomfortable sitting looking at computer while at work, had to keep standing this week and boss became very upset with her. Reports gabapentin not effective.  Having right wrist and thumb pain, treated for CTS in the past with injections.  Habits & Providers  Alcohol-Tobacco-Diet     Tobacco Status: current     Tobacco Counseling: to quit use of tobacco products     Cigarette Packs/Day: 0.25  Current Medications (verified): 1)  Levothroid 175 Mcg Tabs (Levothyroxine Sodium) .Marland Kitchen.. 1 Tablet By Mouth Once A Day, Take 30 Minutes Before Food or Other Meds. 2)  Ranitidine Hcl 150 Mg Caps (Ranitidine Hcl) .... Take One Pill Twice Daily 3)  Ventolin Hfa 108 (90 Base) Mcg/act Aers (Albuterol Sulfate) .... Sig: 2 Puffs By Mouth Every 4-6 Hours As Needed Disp 1 Hfa 4)  Gabapentin 300 Mg Caps (Gabapentin) .... One At Bedtime 5)  Metformin Hcl 500 Mg Tabs (Metformin Hcl) .... Take One Pill With Breakfast and One With Dinner 6)  Itch Relief 2 % Crea (Diphenhydramine Hcl) .... Apply Cream To Ichy Area of Skin 1 Large Tube 7)  Ibuprofen 600 Mg Tabs (Ibuprofen) .... Take One Pill Every 6 Hours To Decrease Inflamation in Your Joints. 8)  Simvastatin 40 Mg Tabs (Simvastatin) .... Take One  Tablet Daily 9)  Capsaicin 0.075 % Crea (Capsaicin) .... Apply To Affected Joints 3 To 4 Times A Day. Rub in Completely. Give One Architectural technologist. 10)  Flexeril 10 Mg Tabs (Cyclobenzaprine Hcl) .... One Two Times A Day As Needed Muscle Pain  Allergies (verified): 1)  Sulfamethoxazole (Sulfamethoxazole)  Social History: Packs/Day:  0.25  Review of Systems General:  Denies fatigue, fever, malaise, and sweats. MS:  Complains of joint pain.  Physical Exam  General:  In no acute distress Msk:  Right + tinels and phalens test, some atrophy of thenar eminence.  Still, painful left SCM and upper trap muscles.  Pain with neck ROM.  Negative empty can , excellent UE ROM.   Impression & Recommendations:  Problem # 1:  HAND PAIN, RIGHT (ICD-729.5) Refer for treatment of CTS, gve cock up wrist splint Orders: Orthopedic Referral (Ortho) [facility]- Est  Level 4 ([address])  Problem # 2:  NECK PAIN, LEFT (ICD-723.1)  isolated to muscle, add flexeril and Physical therapy. Incresae gabapentin to 300 mg qs Her updated medication list for this problem includes:    Ibuprofen 600 Mg Tabs (Ibuprofen) .Marland Kitchen... Take one pill every 6 hours to decrease inflamation in your joints.    Flexeril 10 Mg Tabs (Cyclobenzaprine hcl) ..... One two times a day as  needed muscle pain  Orders: [facility]- Est  Level 4 ([address])  Problem # 3:  DIABETES MELLITUS, TYPE II, WITHOUT COMPLICATIONS (ICD-250.00) Check LDL and LFTs now on statin. Her updated medication list for this problem includes:    Metformin Hcl 500 Mg Tabs (Metformin hcl) .Marland Kitchen... Take one pill with breakfast and one with dinner  Orders: Direct LDL-[facility] ([address]81[address]) [facility]- Est  Level 4 ([address]) Hepatic-[facility] ([address])  Complete Medication List: 1)  Levothroid 175 Mcg Tabs (Levothyroxine sodium) .Marland Kitchen.. 1 tablet by mouth once a day, take 30 minutes before food or other meds. 2)  Ranitidine Hcl 150 Mg Caps (Ranitidine hcl) .... Take one pill twice daily 3)  Ventolin  Hfa 108 (90 Base) Mcg/act Aers (Albuterol sulfate) .... Sig: 2 puffs by mouth every 4-6 hours as needed disp 1 hfa 4)  Gabapentin 300 Mg Caps (Gabapentin) .... One at bedtime 5)  Metformin Hcl 500 Mg Tabs (Metformin hcl) .... Take one pill with breakfast and one with dinner 6)  Itch Relief 2 % Crea (Diphenhydramine hcl) .... Apply cream to ichy area of skin 1 large tube 7)  Ibuprofen 600 Mg Tabs (Ibuprofen) .... Take one pill every 6 hours to decrease inflamation in your joints. 8)  Simvastatin 40 Mg Tabs (Simvastatin) .... Take one tablet daily 9)  Capsaicin 0.075 % Crea (Capsaicin) .... Apply to affected joints 3 to 4 times a day. rub in completely. give one large container. 10)  Flexeril 10 Mg Tabs (Cyclobenzaprine hcl) .... One two times a day as needed muscle pain  Patient Instructions: 1)  Physical Therapy will call you directely to scheldule treatment for your neck pain, in the meatime do the stretching exercises, and sue the muscle relaxant at bedtime and during the day as long you are not working 2)  Increase gabapentin to 300 mg, may take 3 of the 100s until gone, new script 3)  Our nurse will schedule an apt with the hand doctor at [facility] Prescriptions: GABAPENTIN 300 MG CAPS (GABAPENTIN) one at bedtime  #30 x 3   Entered and Authorized by:   Luretha Murphy NP   Signed by:   Luretha Murphy NP on 08/23/2009   Method used:   Electronically to        [facility] (retail)       [address]       [place], [place]  [address]       Ph: [phone]       Fax: [phone]   RxID:   [id number] FLEXERIL 10 MG TABS (CYCLOBENZAPRINE HCL) one two times a day as needed muscle pain  #60 x 0   Entered and Authorized by:   Luretha Murphy NP   Signed by:   Luretha Murphy NP on 08/23/2009   Method used:   Electronically to        [facility] (retail)       [address]       [place], [place]  [address]       Ph: [phone]        Fax: [phone]   RxID:   [id number]

## 2010-06-12 NOTE — Letter (Signed)
Summary: Results Follow-up Letter  [facility]  [address]   [place], [place] [address]   Phone: [phone]  Fax: [phone]    05/26/2009  [address] [place], [place]  [address]  Dear Ms. Eades,   The following are the results of your recent test(s):  Test     Result      _________________________________________________________ Cholesterol LDL(Bad cholesterol):    185      Your goal is less than: 100    HDL (Good cholesterol):  47      Your goal is more than: 40 _________________________________________________________  You will need to start a medications to decrease your cholesterol. I will be prescribing it so please pick it up at your pharamcy. Also, please decrease the amount of saturated fats (butter, animal fat, fried foods, cheese) in your diet to help lower your cholesterol. Please make a LAB appointment to have your cholesterol rechecked in 8 weeks after starting the medication.     Sincerely,  Jamie Brookes MD [facility]

## 2010-06-12 NOTE — Progress Notes (Signed)
Summary: Rx Req  Medications Added CAPSAICIN 0.075 % CREA (CAPSAICIN) apply to affected joints 3 to 4 times a day. Rub in completely. Give one large container.       Phone Note Call from Patient Call back at [phone]   Caller: Patient Summary of Call: Pt wondering if there is a cream or gel that can be perscribed for her for the pain in her joints?  Has tried Biofreeze before and it has helped. Initial call taken by: Clydell Hakim,  July 30, 2009 10:45 AM  Follow-up for Phone Call        will forward to PCP. Follow-up by: Theresia Lo RN,  July 30, 2009 11:56 AM  Additional Follow-up for Phone Call Additional follow up Details #1::        She can buy Capsaicin cream or [place] Biofreeze again. I think Capsaicin cream works better. I will try sending in an Rx for Capsaicin cream but it may be easier to get it OTC. Please let her know.  Additional Follow-up by: Jamie Brookes MD,  August 01, 2009 7:07 PM    New/Updated Medications: CAPSAICIN 0.075 % CREA (CAPSAICIN) apply to affected joints 3 to 4 times a day. Rub in completely. Give one large container. Prescriptions: CAPSAICIN 0.075 % CREA (CAPSAICIN) apply to affected joints 3 to 4 times a day. Rub in completely. Give one large container.  #1 x 1   Entered and Authorized by:   Jamie Brookes MD   Signed by:   Jamie Brookes MD on 08/01/2009   Method used:   Electronically to        [facility] (retail)       [address]       [place], [place]  [address]       Ph: [phone]       Fax: [phone]   RxID:   [id number]  message leftb to return call. Theresia Lo RN  August 02, 2009 9:09 AM   patient notified. Theresia Lo RN  August 02, 2009 12:05 PM

## 2010-06-12 NOTE — Miscellaneous (Signed)
Summary: Start Simvastatin  Medications Added SIMVASTATIN 40 MG TABS (SIMVASTATIN) take one tablet daily       Clinical Lists Changes  Medications: Added new medication of SIMVASTATIN 40 MG TABS (SIMVASTATIN) take one tablet daily - Signed Rx of SIMVASTATIN 40 MG TABS (SIMVASTATIN) take one tablet daily;  #96 x 3;  Signed;  Entered by: Jamie Brookes MD;  Authorized by: Jamie Brookes MD;  Method used: Electronically to [facility] [address] [place], [place], [place]  [address], Ph: [phone], Fax: [phone]    Prescriptions: SIMVASTATIN 40 MG TABS (SIMVASTATIN) take one tablet daily  #96 x 3   Entered and Authorized by:   Jamie Brookes MD   Signed by:   Jamie Brookes MD on 05/26/2009   Method used:   Electronically to        [facility] (retail)       [address]       [place], [place]  [address]       Ph: [phone]       Fax: [phone]   RxID:   [id number]

## 2010-07-08 ENCOUNTER — Other Ambulatory Visit: Payer: Self-pay | Admitting: Nurse Practitioner

## 2010-07-08 DIAGNOSIS — Z09 Encounter for follow-up examination after completed treatment for conditions other than malignant neoplasm: Secondary | ICD-10-CM

## 2010-07-15 ENCOUNTER — Ambulatory Visit
Admission: RE | Admit: 2010-07-15 | Discharge: 2010-07-15 | Disposition: A | Discharge status: Home / Self Care | Payer: Commercial Managed Care - PPO | Source: Ambulatory Visit | Attending: *Deleted | Admitting: *Deleted

## 2010-07-15 DIAGNOSIS — Z09 Encounter for follow-up examination after completed treatment for conditions other than malignant neoplasm: Secondary | ICD-10-CM

## 2010-09-26 NOTE — Op Note (Signed)
[place]  Patient:    Melissa Harmon, Melissa Harmon                        MRN: [id number] Proc. Date: 08/04/99 Adm. Date:  [id number] Attending:  Sonda Primes CC:         Mardene Celeste. Lurene Shadow, M.D. x 2                           Operative Report  PREOPERATIVE DIAGNOSIS:  Diffuse, multilobar multinodular goiter.  POSTOPERATIVE DIAGNOSIS:  Diffuse, multilobar multinodular goiter with thyroiditis.  PROCEDURE:  Bilateral subtotal thyroidectomy.  SURGEON:  Mardene Celeste. Lurene Shadow, M.D.  ASSISTANT:  Marnee Spring. Wiliam Ke, M.D.  ANESTHESIA:  General.  INDICATIONS:  Patient is a 55 year old female presenting with a large and diffuse thyroid.  She had been on suppression therapy for some period of time without any relief as the thyroid continued to grow and become more painful.  She was brought to the operating room now for thyroidectomy.  DESCRIPTION OF PROCEDURE:  Following the induction of anesthesia, the patient was positioned supinely.  Head and neck hyperextended.  The anterior neck and chest  were prepped and draped to be included in a sterile operative field.  A collar incision approximately two fingerbreadths above the sternal notch was deepened through the skin and subcutaneous tissue, carried across the platysma. Superior flap up to the thyroid cartilage was raised and inferior flap down to the sternal notch was raised.  The strap muscles were opened in the midline.  Dissection carried down to the thyroid.  The right thyroid lobe was then dissected free and elevated and dissection of the lower pole of the thyroid, identified both parathyroid gland and the recurrent laryngeal nerve.  The upper pole of the thyroid was then dissected free and the upper pole doubly clamped and secured with ties of 0 silk.  With the parathyroid glands and recurrent laryngeal nerve well visualized and cleared out of the way, the rim of thyroid was then dissected over  these using clamps and ties of suture ligatures of 3-0 Vicryl to maintain hemostasis on the  thyroid remnant.  The thyroid lobe was then carried medially over the midline taking with [place] the isthmus.  Again, attention was then turned to the left lobe f the thyroid, which was similarly elevated and grasped.  Dissection of the lower  pole identifying both the lower pole parathyroid and the recurrent laryngeal nerve. Straight clamps were then used to transect the thyroid leaving a rim of thyroid  tissue over the nerve and the parathyroid glands and this dissection was carried down to the trachea and the rest of the thyroid dissected free from the trachea and removed and forwarded for pathologic evaluation.  Frozen section diagnosis showed thyroiditis.  All areas of dissection were then checked for hemostasis. Sponge, instrument and sharp counts were then verified.  Wound closed in layers as follows: midline strap muscles closed with running 3-0 Vicryl suture, platysma muscle closed with interrupted 3-0 Vicryl sutures and the skin was closed with a running subcuticular 5-0 Monocryl.  Then, reinforced with Steri-Strips and sterile dressings were applied. Anesthetic reversed, patient removed from the operating  room to the recovery room in stable condition having tolerated this procedure well. DD:  08/04/99 TD:  08/04/99 Job: [id number]

## 2011-02-24 ENCOUNTER — Emergency Department (HOSPITAL_COMMUNITY)
Admission: EM | Admit: 2011-02-24 | Discharge: 2011-02-24 | Disposition: A | Discharge status: Home / Self Care | Payer: Commercial Managed Care - PPO | Attending: Emergency Medicine | Admitting: Emergency Medicine

## 2011-02-24 DIAGNOSIS — M79609 Pain in unspecified limb: Secondary | ICD-10-CM | POA: Insufficient documentation

## 2011-02-24 DIAGNOSIS — J3489 Other specified disorders of nose and nasal sinuses: Secondary | ICD-10-CM | POA: Insufficient documentation

## 2011-02-24 DIAGNOSIS — Z79899 Other long term (current) drug therapy: Secondary | ICD-10-CM | POA: Insufficient documentation

## 2011-02-24 DIAGNOSIS — H9209 Otalgia, unspecified ear: Secondary | ICD-10-CM | POA: Insufficient documentation

## 2011-02-24 DIAGNOSIS — E039 Hypothyroidism, unspecified: Secondary | ICD-10-CM | POA: Insufficient documentation

## 2011-02-24 DIAGNOSIS — E1149 Type 2 diabetes mellitus with other diabetic neurological complication: Secondary | ICD-10-CM | POA: Insufficient documentation

## 2011-02-24 DIAGNOSIS — E1142 Type 2 diabetes mellitus with diabetic polyneuropathy: Secondary | ICD-10-CM | POA: Insufficient documentation

## 2011-06-11 ENCOUNTER — Other Ambulatory Visit (HOSPITAL_COMMUNITY): Payer: Self-pay | Admitting: Internal Medicine

## 2011-06-11 ENCOUNTER — Other Ambulatory Visit: Payer: Self-pay | Admitting: Internal Medicine

## 2011-06-11 DIAGNOSIS — Z1231 Encounter for screening mammogram for malignant neoplasm of breast: Secondary | ICD-10-CM

## 2011-07-16 ENCOUNTER — Ambulatory Visit
Admission: RE | Admit: 2011-07-16 | Discharge: 2011-07-16 | Disposition: A | Discharge status: Home / Self Care | Payer: Commercial Managed Care - PPO | Source: Ambulatory Visit | Attending: Internal Medicine | Admitting: Internal Medicine

## 2011-07-16 ENCOUNTER — Encounter (HOSPITAL_COMMUNITY): Payer: Self-pay | Admitting: Emergency Medicine

## 2011-07-16 ENCOUNTER — Emergency Department (HOSPITAL_COMMUNITY)
Admission: EM | Admit: 2011-07-16 | Discharge: 2011-07-16 | Disposition: A | Discharge status: Home / Self Care | Payer: Commercial Managed Care - PPO | Attending: Emergency Medicine | Admitting: Emergency Medicine

## 2011-07-16 DIAGNOSIS — E119 Type 2 diabetes mellitus without complications: Secondary | ICD-10-CM | POA: Insufficient documentation

## 2011-07-16 DIAGNOSIS — E079 Disorder of thyroid, unspecified: Secondary | ICD-10-CM | POA: Insufficient documentation

## 2011-07-16 DIAGNOSIS — Z1231 Encounter for screening mammogram for malignant neoplasm of breast: Secondary | ICD-10-CM

## 2011-07-16 DIAGNOSIS — R300 Dysuria: Secondary | ICD-10-CM

## 2011-07-16 DIAGNOSIS — F17200 Nicotine dependence, unspecified, uncomplicated: Secondary | ICD-10-CM | POA: Insufficient documentation

## 2011-07-16 DIAGNOSIS — R319 Hematuria, unspecified: Secondary | ICD-10-CM | POA: Insufficient documentation

## 2011-07-16 HISTORY — DX: Disorder of thyroid, unspecified: E07.9

## 2011-07-16 LAB — URINALYSIS, ROUTINE W REFLEX MICROSCOPIC
Bilirubin Urine: NEGATIVE
Glucose, UA: NEGATIVE mg/dL
Ketones, ur: NEGATIVE mg/dL
pH: 5.5 (ref 5.0–8.0)

## 2011-07-16 MED ORDER — CEPHALEXIN 500 MG PO CAPS: 500.0000 mg | ORAL_CAPSULE | Freq: Four times a day (QID) | ORAL | Status: AC

## 2011-07-16 MED ORDER — PHENAZOPYRIDINE HCL 200 MG PO TABS: 200.0000 mg | ORAL_TABLET | Freq: Three times a day (TID) | ORAL | Status: AC

## 2011-07-16 NOTE — ED Notes (Signed)
Pt here with c/o blood in her urine , burning upon urination and frequency has increased, pain is radiating around to her back

## 2011-07-16 NOTE — ED Provider Notes (Signed)
History     CSN: [id number]  Arrival date & time 07/16/11  1230   First MD Initiated Contact with Patient 07/16/11 1409      Chief Complaint  Patient presents with  . Hematuria    (Consider location/radiation/quality/duration/timing/severity/associated sxs/prior treatment) Patient is a 56 y.o. female presenting with hematuria. The history is provided by the patient.  Hematuria   patient presents with dysuria and urinary urgency x1 week. Some flank pain without vomiting. No fever or, vaginal bleeding or discharge. Similar symptoms associated with UTI in the past. No medications taken for this prior to arrival. Pain worse with urination made better with nothing. Called her PCP and she has an appointment to be seen next week  Past Medical History  Diagnosis Date  . Diabetes mellitus   . Thyroid disease     Past Surgical History  Procedure Date  . Thyroidectomy     History reviewed. No pertinent family history.  History  Substance Use Topics  . Smoking status: Current Everyday Smoker -- 0.5 packs/day  . Smokeless tobacco: Not on file  . Alcohol Use: 0.6 oz/week    1 Cans of beer per week    OB History    Grav Para Term Preterm Abortions TAB SAB Ect Mult Living                  Review of Systems  Genitourinary: Positive for hematuria.  All other systems reviewed and are negative.    Allergies  Sulfamethoxazole  Home Medications   Current Outpatient Rx  Name Route Sig Dispense Refill  . LANSOPRAZOLE 30 MG PO CPDR Oral Take 30 mg by mouth daily.    Marland Kitchen LEVOTHYROXINE SODIUM 200 MCG PO TABS Oral Take 200 mcg by mouth daily.    Marland Kitchen METFORMIN HCL 500 MG PO TABS Oral Take 500 mg by mouth 2 (two) times daily with a meal.    . MOMETASONE FUROATE 50 MCG/ACT NA SUSP Nasal Place 2 sprays into the nose daily.    Marland Kitchen PRENATAL MULTIVITAMIN CH Oral Take 1 tablet by mouth daily.    Marland Kitchen SIMVASTATIN 10 MG PO TABS Oral Take 10 mg by mouth at bedtime.      BP 128/76  Pulse 93   Temp(Src) 98.3 F (36.8 C) (Oral)  Resp 20  SpO2 97%  Physical Exam  Nursing note and vitals reviewed. Constitutional: She is oriented to person, place, and time. Vital signs are normal. She appears well-developed and well-nourished.  Non-toxic appearance. No distress.  HENT:  Head: Normocephalic and atraumatic.  Eyes: Conjunctivae, EOM and lids are normal. Pupils are equal, round, and reactive to light.  Neck: Normal range of motion. Neck supple. No tracheal deviation present. No mass present.  Cardiovascular: Normal rate, regular rhythm and normal heart sounds.  Exam reveals no gallop.   No murmur heard. Pulmonary/Chest: Effort normal and breath sounds normal. No stridor. No respiratory distress. She has no decreased breath sounds. She has no wheezes. She has no rhonchi. She has no rales.  Abdominal: Soft. Normal appearance and bowel sounds are normal. She exhibits no distension. There is no tenderness. There is no rigidity, no rebound, no guarding and no CVA tenderness.  Musculoskeletal: Normal range of motion. She exhibits no edema and no tenderness.  Neurological: She is alert and oriented to person, place, and time. She has normal strength. No cranial nerve deficit or sensory deficit. GCS eye subscore is 4. GCS verbal subscore is 5. GCS motor subscore is 6.  Skin: Skin is warm and dry. No abrasion and no rash noted.  Psychiatric: She has a normal mood and affect. Her speech is normal and behavior is normal.    ED Course  Procedures (including critical care time)  Labs Reviewed  URINALYSIS, ROUTINE W REFLEX MICROSCOPIC - Abnormal; Notable for the following:    APPearance CLOUDY (*)    All other components within normal limits   No results found.   No diagnosis found.    MDM  Patient to be treated for suspected early UTI despite current urine results. Urine culture has been sent. Patient to followup with her doctor next week        Toy Baker, MD 07/16/11  1427

## 2011-07-16 NOTE — Discharge Instructions (Signed)

## 2011-07-17 LAB — URINE CULTURE
Colony Count: NO GROWTH
Culture  Setup Time: 201303071542

## 2011-07-20 ENCOUNTER — Other Ambulatory Visit: Payer: Self-pay | Admitting: Internal Medicine

## 2011-07-20 DIAGNOSIS — R928 Other abnormal and inconclusive findings on diagnostic imaging of breast: Secondary | ICD-10-CM

## 2011-07-24 ENCOUNTER — Other Ambulatory Visit: Payer: Commercial Managed Care - PPO

## 2011-07-30 ENCOUNTER — Inpatient Hospital Stay: Admission: RE | Admit: 2011-07-30 | Payer: Commercial Managed Care - PPO | Source: Ambulatory Visit

## 2011-07-30 ENCOUNTER — Other Ambulatory Visit: Payer: Commercial Managed Care - PPO

## 2011-08-06 ENCOUNTER — Other Ambulatory Visit: Payer: Commercial Managed Care - PPO

## 2011-09-16 ENCOUNTER — Ambulatory Visit
Admission: RE | Admit: 2011-09-16 | Discharge: 2011-09-16 | Disposition: A | Discharge status: Home / Self Care | Payer: Commercial Managed Care - PPO | Source: Ambulatory Visit | Attending: Internal Medicine | Admitting: Internal Medicine

## 2011-09-16 DIAGNOSIS — R928 Other abnormal and inconclusive findings on diagnostic imaging of breast: Secondary | ICD-10-CM

## 2012-03-18 ENCOUNTER — Encounter (HOSPITAL_COMMUNITY): Payer: Self-pay | Admitting: *Deleted

## 2012-03-18 ENCOUNTER — Emergency Department (INDEPENDENT_AMBULATORY_CARE_PROVIDER_SITE_OTHER)
Admission: EM | Admit: 2012-03-18 | Discharge: 2012-03-18 | Disposition: A | Discharge status: Home / Self Care | Payer: Self-pay | Source: Home / Self Care | Attending: Emergency Medicine | Admitting: Emergency Medicine

## 2012-03-18 DIAGNOSIS — M791 Myalgia, unspecified site: Secondary | ICD-10-CM

## 2012-03-18 DIAGNOSIS — S161XXA Strain of muscle, fascia and tendon at neck level, initial encounter: Secondary | ICD-10-CM

## 2012-03-18 DIAGNOSIS — IMO0001 Reserved for inherently not codable concepts without codable children: Secondary | ICD-10-CM

## 2012-03-18 DIAGNOSIS — M549 Dorsalgia, unspecified: Secondary | ICD-10-CM

## 2012-03-18 DIAGNOSIS — S139XXA Sprain of joints and ligaments of unspecified parts of neck, initial encounter: Secondary | ICD-10-CM

## 2012-03-18 DIAGNOSIS — M25551 Pain in right hip: Secondary | ICD-10-CM

## 2012-03-18 DIAGNOSIS — M25559 Pain in unspecified hip: Secondary | ICD-10-CM

## 2012-03-18 HISTORY — DX: Unspecified asthma, uncomplicated: J45.909

## 2012-03-18 HISTORY — DX: Pure hypercholesterolemia, unspecified: E78.00

## 2012-03-18 MED ORDER — TRAMADOL HCL 50 MG PO TABS: 50.0000 mg | ORAL_TABLET | Freq: Four times a day (QID) | ORAL | Status: DC | PRN

## 2012-03-18 MED ORDER — CYCLOBENZAPRINE HCL 5 MG PO TABS: 5.0000 mg | ORAL_TABLET | Freq: Three times a day (TID) | ORAL | Status: DC | PRN

## 2012-03-18 MED ORDER — NAPROXEN 500 MG PO TBEC: 500.0000 mg | DELAYED_RELEASE_TABLET | Freq: Two times a day (BID) | ORAL | Status: DC
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
## 2012-03-18 NOTE — ED Provider Notes (Signed)
Medical screening examination/treatment/procedure(s) were performed by non-physician practitioner and as supervising physician I was immediately available for consultation/collaboration.  Merial Moritz, M.D.   Amandeep Nesmith C Fianna Snowball, MD 03/18/12 1948 
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
## 2012-07-18 ENCOUNTER — Other Ambulatory Visit: Payer: Self-pay

## 2012-07-18 DIAGNOSIS — Z1231 Encounter for screening mammogram for malignant neoplasm of breast: Secondary | ICD-10-CM

## 2012-08-10 ENCOUNTER — Ambulatory Visit: Payer: Commercial Managed Care - PPO

## 2012-10-05 ENCOUNTER — Encounter (HOSPITAL_COMMUNITY): Payer: Self-pay | Admitting: *Deleted

## 2012-10-05 ENCOUNTER — Emergency Department (HOSPITAL_COMMUNITY)
Admission: EM | Admit: 2012-10-05 | Discharge: 2012-10-05 | Disposition: A | Discharge status: Home / Self Care | Payer: Commercial Managed Care - PPO | Attending: Emergency Medicine | Admitting: Emergency Medicine

## 2012-10-05 DIAGNOSIS — E079 Disorder of thyroid, unspecified: Secondary | ICD-10-CM | POA: Insufficient documentation

## 2012-10-05 DIAGNOSIS — J45909 Unspecified asthma, uncomplicated: Secondary | ICD-10-CM | POA: Insufficient documentation

## 2012-10-05 DIAGNOSIS — Z79899 Other long term (current) drug therapy: Secondary | ICD-10-CM | POA: Insufficient documentation

## 2012-10-05 DIAGNOSIS — IMO0001 Reserved for inherently not codable concepts without codable children: Secondary | ICD-10-CM | POA: Insufficient documentation

## 2012-10-05 DIAGNOSIS — Z8619 Personal history of other infectious and parasitic diseases: Secondary | ICD-10-CM | POA: Insufficient documentation

## 2012-10-05 DIAGNOSIS — F17200 Nicotine dependence, unspecified, uncomplicated: Secondary | ICD-10-CM | POA: Insufficient documentation

## 2012-10-05 DIAGNOSIS — R110 Nausea: Secondary | ICD-10-CM

## 2012-10-05 DIAGNOSIS — E7800 Pure hypercholesterolemia, unspecified: Secondary | ICD-10-CM | POA: Insufficient documentation

## 2012-10-05 DIAGNOSIS — E119 Type 2 diabetes mellitus without complications: Secondary | ICD-10-CM | POA: Insufficient documentation

## 2012-10-05 DIAGNOSIS — R21 Rash and other nonspecific skin eruption: Secondary | ICD-10-CM

## 2012-10-05 DIAGNOSIS — Z791 Long term (current) use of non-steroidal anti-inflammatories (NSAID): Secondary | ICD-10-CM | POA: Insufficient documentation

## 2012-10-05 HISTORY — DX: Personal history of other infectious and parasitic diseases: Z86.19

## 2012-10-05 HISTORY — DX: Fibromyalgia: M79.7

## 2012-10-05 LAB — GLUCOSE, CAPILLARY: Glucose-Capillary: 126 mg/dL — ABNORMAL HIGH (ref 70–99)

## 2012-10-05 MED ORDER — PROMETHAZINE HCL 25 MG PO TABS: 25.0000 mg | ORAL_TABLET | Freq: Four times a day (QID) | ORAL | Status: DC | PRN

## 2012-10-05 MED ORDER — ONDANSETRON 8 MG PO TBDP
8.0000 mg | ORAL_TABLET | Freq: Once | ORAL | Status: AC
  Administered 2012-10-05: 8 mg via ORAL
  Filled 2012-10-05: qty 1

## 2012-10-05 NOTE — ED Provider Notes (Signed)
History     CSN: [id number]  Arrival date & time 10/05/12  0825   First MD Initiated Contact with Patient 10/05/12 [phone]      Chief Complaint  Patient presents with  . Rash     The history is provided by the patient.   patient reports discoloration to her right lower abdomen several days ago and she thinks that she may have been bitten by something.  Yesterday she was able to scratch the area and a small amount of pus came out.  She squeezed the area of the lobe the more pus came out.  She reports the area is improving.  She states it itches.  No surrounding erythema.  No fevers or chills.  She has had low blood nausea today without vomiting.  No diarrhea.  No abdominal tenderness.  No other complaints.  She does have a history of diabetes.  Her symptoms are mild in severity.  She's not tried anything for the discomfort or itching.  Past Medical History  Diagnosis Date  . Diabetes mellitus   . Thyroid disease   . Hypercholesteremia   . Asthma   . Fibromyalgia   . History of shingles     last 2013    Past Surgical History  Procedure Laterality Date  . Thyroidectomy      History reviewed. No pertinent family history.  History  Substance Use Topics  . Smoking status: Current Every Day Smoker -- 0.25 packs/day for 25 years    Types: Cigarettes  . Smokeless tobacco: Not on file  . Alcohol Use: 0.6 oz/week    1 Cans of beer per week     Comment: occasional    OB History   Grav Para Term Preterm Abortions TAB SAB Ect Mult Living                  Review of Systems  Skin: Positive for rash.  All other systems reviewed and are negative.    Allergies  Sulfamethoxazole  Home Medications   Current Outpatient Rx  Name  Route  Sig  Dispense  Refill  . ALBUTEROL IN   Inhalation   Inhale into the lungs as needed.         . cyclobenzaprine (FLEXERIL) 5 MG tablet   Oral   Take 1 tablet (5 mg total) by mouth 3 (three) times daily as needed for muscle spasms.   21  tablet   0   . lansoprazole (PREVACID) 30 MG capsule   Oral   Take 30 mg by mouth daily.         Marland Kitchen levothyroxine (SYNTHROID, LEVOTHROID) 200 MCG tablet   Oral   Take 200 mcg by mouth daily.         . metFORMIN (GLUCOPHAGE) 500 MG tablet   Oral   Take 500 mg by mouth 2 (two) times daily with a meal.         . mometasone (NASONEX) 50 MCG/ACT nasal spray   Nasal   Place 2 sprays into the nose daily.         . naproxen (EC-NAPROSYN) 500 MG EC tablet   Oral   Take 1 tablet (500 mg total) by mouth 2 (two) times daily with a meal.   20 tablet   0   . Prenatal Vit-Fe Fumarate-FA (PRENATAL MULTIVITAMIN) TABS   Oral   Take 1 tablet by mouth daily.         . promethazine (PHENERGAN) 25 MG tablet  Oral   Take 1 tablet (25 mg total) by mouth every 6 (six) hours as needed for nausea.   8 tablet   0   . Rosuvastatin Calcium (CRESTOR PO)   Oral   Take by mouth.         . simvastatin (ZOCOR) 10 MG tablet   Oral   Take 10 mg by mouth at bedtime.         . traMADol (ULTRAM) 50 MG tablet   Oral   Take 1 tablet (50 mg total) by mouth every 6 (six) hours as needed for pain.   15 tablet   0   . UNKNOWN TO PATIENT      OTC med "to increase libido"           BP 142/71  Pulse 82  Temp(Src) 99.8 F (37.7 C) (Oral)  Resp 16  SpO2 98%  Physical Exam  Nursing note and vitals reviewed. Constitutional: She is oriented to person, place, and time. She appears well-developed and well-nourished. No distress.  HENT:  Head: Normocephalic and atraumatic.  Eyes: EOM are normal.  Neck: Normal range of motion.  Cardiovascular: Normal rate, regular rhythm and normal heart sounds.   Pulmonary/Chest: Effort normal and breath sounds normal.  Abdominal: Soft. She exhibits no distension. There is no tenderness.  Musculoskeletal: Normal range of motion.  Neurological: She is alert and oriented to person, place, and time.  Skin: Skin is warm and dry.  Small discoloration of  her right lower abdomen without fluctuance, edema, abscess.  The skin has the appearance of the healing abscess with a small amount of excoriation of the overlying epidermis  Psychiatric: She has a normal mood and affect. Judgment normal.    ED Course  Procedures (including critical care time)  Labs Reviewed - No data to display No results found.   1. Nausea   2. Rash       MDM  This appears to be healing abscess.  There is no indication for antibiotics at this time as there is no erythema and no ongoing fluctuance.  No indication for incision and drainage.  I think the abscess drained on its own at home yesterday and now hard he appears to be healing.  She is overall very well appearing.  Her abdominal exam is benign.  Am not sure if the nausea is related.  No urinary complaints.  No indication for lab testing or imaging.  Discharge home in good condition.       Lyanne Co, MD 10/05/12 [phone]

## 2012-10-05 NOTE — ED Notes (Addendum)
Pt reports circular area, smaller than palm on right side of abdomen, noticed site yesterday, reports itching and burning, some Chappuis discharge this am, pain 6/10. Hx of diabetes

## 2013-01-17 ENCOUNTER — Emergency Department (HOSPITAL_COMMUNITY): Payer: Commercial Managed Care - PPO

## 2013-01-17 ENCOUNTER — Emergency Department (HOSPITAL_COMMUNITY)
Admission: EM | Admit: 2013-01-17 | Discharge: 2013-01-17 | Disposition: A | Discharge status: Home / Self Care | Payer: Commercial Managed Care - PPO | Attending: Emergency Medicine | Admitting: Emergency Medicine

## 2013-01-17 ENCOUNTER — Encounter (HOSPITAL_COMMUNITY): Payer: Self-pay | Admitting: Emergency Medicine

## 2013-01-17 DIAGNOSIS — E119 Type 2 diabetes mellitus without complications: Secondary | ICD-10-CM | POA: Insufficient documentation

## 2013-01-17 DIAGNOSIS — IMO0001 Reserved for inherently not codable concepts without codable children: Secondary | ICD-10-CM | POA: Insufficient documentation

## 2013-01-17 DIAGNOSIS — Z79899 Other long term (current) drug therapy: Secondary | ICD-10-CM | POA: Insufficient documentation

## 2013-01-17 DIAGNOSIS — Z8619 Personal history of other infectious and parasitic diseases: Secondary | ICD-10-CM | POA: Insufficient documentation

## 2013-01-17 DIAGNOSIS — E7800 Pure hypercholesterolemia, unspecified: Secondary | ICD-10-CM | POA: Insufficient documentation

## 2013-01-17 DIAGNOSIS — R110 Nausea: Secondary | ICD-10-CM

## 2013-01-17 DIAGNOSIS — M62838 Other muscle spasm: Secondary | ICD-10-CM

## 2013-01-17 DIAGNOSIS — J45909 Unspecified asthma, uncomplicated: Secondary | ICD-10-CM | POA: Insufficient documentation

## 2013-01-17 DIAGNOSIS — E079 Disorder of thyroid, unspecified: Secondary | ICD-10-CM | POA: Insufficient documentation

## 2013-01-17 DIAGNOSIS — F17200 Nicotine dependence, unspecified, uncomplicated: Secondary | ICD-10-CM | POA: Insufficient documentation

## 2013-01-17 DIAGNOSIS — M542 Cervicalgia: Secondary | ICD-10-CM | POA: Insufficient documentation

## 2013-01-17 LAB — BASIC METABOLIC PANEL
BUN: 11 mg/dL (ref 6–23)
Calcium: 8.7 mg/dL (ref 8.4–10.5)
GFR calc Af Amer: >90 mL/min (ref >90)
GFR calc non Af Amer: >90 mL/min (ref >90)
Potassium: 3.9 mEq/L (ref 3.5–5.1)
Sodium: 139 mEq/L (ref 135–145)

## 2013-01-17 LAB — CBC WITH DIFFERENTIAL/PLATELET
Basophils Relative: 0 % (ref 0–1)
Eosinophils Absolute: 0.1 10*3/uL (ref 0.0–0.7)
MCH: 28.2 pg (ref 26.0–34.0)
MCHC: 32.9 g/dL (ref 30.0–36.0)
Monocytes Relative: 7 % (ref 3–12)
Neutrophils Relative %: 56 % (ref 43–77)
Platelets: 359 10*3/uL (ref 150–400)

## 2013-01-17 MED ORDER — NAPROXEN 500 MG PO TABS: 500.0000 mg | ORAL_TABLET | Freq: Two times a day (BID) | ORAL | Status: DC

## 2013-01-17 MED ORDER — DIAZEPAM 5 MG/ML IJ SOLN
5.0000 mg | Freq: Once | INTRAMUSCULAR | Status: AC
  Administered 2013-01-17: 5 mg via INTRAMUSCULAR
  Filled 2013-01-17: qty 2

## 2013-01-17 MED ORDER — ONDANSETRON 4 MG PO TBDP: 4.0000 mg | ORAL_TABLET | Freq: Three times a day (TID) | ORAL | Status: DC | PRN

## 2013-01-17 MED ORDER — ONDANSETRON 8 MG PO TBDP
8.0000 mg | ORAL_TABLET | Freq: Once | ORAL | Status: AC
  Administered 2013-01-17: 8 mg via ORAL
  Filled 2013-01-17: qty 1

## 2013-01-17 MED ORDER — KETOROLAC TROMETHAMINE 60 MG/2ML IM SOLN
60.0000 mg | Freq: Once | INTRAMUSCULAR | Status: AC
  Administered 2013-01-17: 60 mg via INTRAMUSCULAR
  Filled 2013-01-17: qty 2

## 2013-01-17 MED ORDER — METHOCARBAMOL 500 MG PO TABS: ORAL_TABLET | ORAL | Status: DC

## 2013-01-17 NOTE — ED Provider Notes (Signed)
CSN: [id number]     Arrival date & time 01/17/13  0818 History   First MD Initiated Contact with Patient 01/17/13 [phone]     Chief Complaint  Patient presents with  . Pain   (Consider location/radiation/quality/duration/timing/severity/associated sxs/prior Treatment) HPI  Patient reports about 3 days ago she started getting pain in her left neck that radiates towards her left shoulder. She denies any change in her activity or injury that she is aware of. She states the pain is made worse by movement especially of her head to the left and when she raises her left arm. She states nothing has made it feel better including ice or heat. She states that pain is sharp and constant. She denies numbness or tingling in her extremities. She states she is right handed. She states she's never had this before. She states 2 days ago she had nausea and vomiting with diaphoresis. She states she continues to have nausea. She is concerned she is having a heart problem b/o a strong family history of heart disease.   PCP Dr Adelene Amas  Past Medical History  Diagnosis Date  . Diabetes mellitus   . Thyroid disease   . Hypercholesteremia   . Asthma   . Fibromyalgia   . History of shingles     last 2013   Past Surgical History  Procedure Laterality Date  . Thyroidectomy     No family history on file. History  Substance Use Topics  . Smoking status: Current Every Day Smoker -- 0.25 packs/day for 25 years    Types: Cigarettes  . Smokeless tobacco: Not on file  . Alcohol Use: 0.6 oz/week    1 Cans of beer per week     Comment: occasional  lives at [employer] alone, but stays with her MOP a lot Smokes 1/2 ppd  OB History   Grav Para Term Preterm Abortions TAB SAB Ect Mult Living                 Review of Systems  All other systems reviewed and are negative.    Allergies  Sulfamethoxazole  Home Medications   Current Outpatient Rx  Name  Route  Sig  Dispense  Refill  . acetaminophen  (TYLENOL) 500 MG tablet   Oral   Take 1,000 mg by mouth every 6 (six) hours as needed for pain.         Marland Kitchen albuterol (PROVENTIL HFA;VENTOLIN HFA) 108 (90 BASE) MCG/ACT inhaler   Inhalation   Inhale 2 puffs into the lungs every 6 (six) hours as needed for wheezing.         . cyclobenzaprine (FLEXERIL) 5 MG tablet   Oral   Take 1 tablet (5 mg total) by mouth 3 (three) times daily as needed for muscle spasms.   21 tablet   0   . levothyroxine (SYNTHROID, LEVOTHROID) 200 MCG tablet   Oral   Take 200 mcg by mouth daily.         . metFORMIN (GLUCOPHAGE) 500 MG tablet   Oral   Take 500 mg by mouth 2 (two) times daily with a meal.         . Prenatal Vit-Fe Fumarate-FA (PRENATAL MULTIVITAMIN) TABS   Oral   Take 1 tablet by mouth daily.         . rosuvastatin (CRESTOR) 20 MG tablet   Oral   Take 20 mg by mouth at bedtime.         Marland Kitchen HYDROcodone-acetaminophen (VICODIN)  5-500 MG per tablet   Oral   Take 1 tablet by mouth once.          BP 128/91  Pulse 74  Temp(Src) 98.1 F (36.7 C) (Oral)  Resp 16  SpO2 99%  Vital signs normal   Physical Exam  Nursing note and vitals reviewed. Constitutional: She is oriented to person, place, and time. She appears well-developed and well-nourished.  Non-toxic appearance. She does not appear ill. No distress.  HENT:  Head: Normocephalic and atraumatic.  Right Ear: External ear normal.  Left Ear: External ear normal.  Nose: Nose normal. No mucosal edema or rhinorrhea.  Mouth/Throat: Oropharynx is clear and moist and mucous membranes are normal. No dental abscesses or edematous.  Eyes: Conjunctivae and EOM are normal. Pupils are equal, round, and reactive to light.  Neck: Normal range of motion. Neck supple. Muscular tenderness present. Normal range of motion present.    Pt reports she has pain when she looks to the left and when I raise or move her left arm  Cardiovascular: Normal rate, regular rhythm and normal heart sounds.   Exam reveals no gallop and no friction rub.   No murmur heard. Pulmonary/Chest: Effort normal and breath sounds normal. No respiratory distress. She has no wheezes. She has no rhonchi. She has no rales. She exhibits no tenderness and no crepitus.  Abdominal: Soft. Normal appearance and bowel sounds are normal. She exhibits no distension. There is no tenderness. There is no rebound and no guarding.  Musculoskeletal: Normal range of motion. She exhibits no edema and no tenderness.  Moves all extremities well.   Neurological: She is alert and oriented to person, place, and time. She has normal strength. No cranial nerve deficit.  Skin: Skin is warm, dry and intact. No rash noted. No erythema. No pallor.  Psychiatric: She has a normal mood and affect. Her speech is normal and behavior is normal. Her mood appears not anxious.    ED Course  Procedures (including critical care time)  Medications  ketorolac (TORADOL) injection 60 mg (60 mg Intramuscular Given 01/17/13 0935)  diazepam (VALIUM) injection 5 mg (5 mg Intramuscular Given 01/17/13 0936)  ondansetron (ZOFRAN-ODT) disintegrating tablet 8 mg (8 mg Oral Given 01/17/13 0936)   Recheck at discharge, patients pain is much better.   Labs Review  Results for orders placed during the hospital encounter of 01/17/13  CBC WITH DIFFERENTIAL      Result Value Range   WBC 7.9  4.0 - 10.5 K/uL   RBC 4.75  3.87 - 5.11 MIL/uL   Hemoglobin 13.4  12.0 - 15.0 g/dL   HCT 82.9  56.2 - 13.0 %   MCV 85.7  78.0 - 100.0 fL   MCH 28.2  26.0 - 34.0 pg   MCHC 32.9  30.0 - 36.0 g/dL   RDW 86.5  78.4 - 69.6 %   Platelets 359  150 - 400 K/uL   Neutrophils Relative % 56  43 - 77 %   Neutro Abs 4.4  1.7 - 7.7 K/uL   Lymphocytes Relative 35  12 - 46 %   Lymphs Abs 2.7  0.7 - 4.0 K/uL   Monocytes Relative 7  3 - 12 %   Monocytes Absolute 0.6  0.1 - 1.0 K/uL   Eosinophils Relative 2  0 - 5 %   Eosinophils Absolute 0.1  0.0 - 0.7 K/uL   Basophils Relative 0  0 - 1 %    Basophils Absolute 0.0  0.0 - 0.1 K/uL  BASIC METABOLIC PANEL      Result Value Range   Sodium 139  135 - 145 mEq/L   Potassium 3.9  3.5 - 5.1 mEq/L   Chloride 105  96 - 112 mEq/L   CO2 25  19 - 32 mEq/L   Glucose, Bld 159 (*) 70 - 99 mg/dL   BUN 11  6 - 23 mg/dL   Creatinine, Ser 1.61  0.50 - 1.10 mg/dL   Calcium 8.7  8.4 - 09.6 mg/dL   GFR calc non Af Amer >90  >90 mL/min   GFR calc Af Amer >90  >90 mL/min  TROPONIN I      Result Value Range   Troponin I <0.30  <0.30 ng/mL   Laboratory interpretation all normal except hyperglycemia  Imaging Review Dg Cervical Spine Complete  01/17/2013   *RADIOLOGY REPORT*  Clinical Data: Progressive neck pain  CERVICAL SPINE - COMPLETE 4+ VIEW  Comparison: None.  Findings: Frontal, lateral, open mouth odontoid, and bilateral oblique views were obtained.  There is no fracture or spondylolisthesis.  Prevertebral soft tissues and predental space regions are normal.  There is moderate disc space narrowing at C5- 6.  Other disc spaces appear normal.  There is facet hypertrophy at C5-6 bilaterally.  No other appreciable facet hypertrophy is seen. There is postoperative change in the left neck region.  IMPRESSION: Osteoarthritic change at C5-6.  No fracture or spondylolisthesis.   Original Report Authenticated By: Bretta Bang, M.D.     Date: 01/17/2013  Rate: 65  Rhythm: normal sinus rhythm  QRS Axis: normal  Intervals: normal  ST/T Wave abnormalities: normal  Conduction Disutrbances:none  Narrative Interpretation: PRWP  Old EKG Reviewed: unchanged from 05/17/2009    MDM  patient has history and physical exam consistent with muscle spasms mainly of her left trapezius muscle.  She's concerned she is having heart problems because of a strong family history of coronary artery disease however her troponin is negative. Her EKG is also normal. She does not have any concerning findings for acute cervical radiculopathy. She does have some arthritic  changes in her cervical spine x-ray.    1. Neck pain on left side   2. Muscle spasms of neck   3. Nausea    New Prescriptions   METHOCARBAMOL (ROBAXIN) 500 MG TABLET    Take 1 or 2 po Q 6hrs for pain   NAPROXEN (NAPROSYN) 500 MG TABLET    Take 1 tablet (500 mg total) by mouth 2 (two) times daily.   ONDANSETRON (ZOFRAN ODT) 4 MG DISINTEGRATING TABLET    Take 1 tablet (4 mg total) by mouth every 8 (eight) hours as needed for nausea.    Plan discharge   Devoria Albe, MD, Franz Dell, MD 01/17/13 [phone]

## 2013-01-17 NOTE — ED Notes (Signed)
MD at bedside. 

## 2013-01-17 NOTE — ED Notes (Addendum)
Pt c/o L side neck, shoulder and arm pain x "3-4 days."  Sts L fingers "feel like that are cramping up."  Denies injury.  Denies numbness and tingling.

## 2013-01-17 NOTE — ED Notes (Signed)
Pt escorted to discharge window. Verbalized understanding discharge instructions. In no acute distress. Vitals reviewed and WDL.  

## 2013-01-17 NOTE — ED Notes (Signed)
Pt c/o generalized lt sided pain that started 3 days ago.  Denies injury.  States that she had diarrhea on Sunday.

## 2013-04-14 ENCOUNTER — Emergency Department (HOSPITAL_COMMUNITY)
Admission: EM | Admit: 2013-04-14 | Discharge: 2013-04-14 | Disposition: A | Discharge status: Home / Self Care | Payer: Commercial Managed Care - PPO | Attending: Emergency Medicine | Admitting: Emergency Medicine

## 2013-04-14 ENCOUNTER — Emergency Department (HOSPITAL_COMMUNITY): Payer: Commercial Managed Care - PPO

## 2013-04-14 DIAGNOSIS — Z8619 Personal history of other infectious and parasitic diseases: Secondary | ICD-10-CM | POA: Insufficient documentation

## 2013-04-14 DIAGNOSIS — R109 Unspecified abdominal pain: Secondary | ICD-10-CM

## 2013-04-14 DIAGNOSIS — E079 Disorder of thyroid, unspecified: Secondary | ICD-10-CM | POA: Insufficient documentation

## 2013-04-14 DIAGNOSIS — K5900 Constipation, unspecified: Secondary | ICD-10-CM

## 2013-04-14 DIAGNOSIS — J45909 Unspecified asthma, uncomplicated: Secondary | ICD-10-CM | POA: Insufficient documentation

## 2013-04-14 DIAGNOSIS — E7800 Pure hypercholesterolemia, unspecified: Secondary | ICD-10-CM | POA: Insufficient documentation

## 2013-04-14 DIAGNOSIS — Z79899 Other long term (current) drug therapy: Secondary | ICD-10-CM | POA: Insufficient documentation

## 2013-04-14 DIAGNOSIS — F17200 Nicotine dependence, unspecified, uncomplicated: Secondary | ICD-10-CM | POA: Insufficient documentation

## 2013-04-14 DIAGNOSIS — J069 Acute upper respiratory infection, unspecified: Secondary | ICD-10-CM

## 2013-04-14 DIAGNOSIS — E119 Type 2 diabetes mellitus without complications: Secondary | ICD-10-CM | POA: Insufficient documentation

## 2013-04-14 DIAGNOSIS — H9209 Otalgia, unspecified ear: Secondary | ICD-10-CM | POA: Insufficient documentation

## 2013-04-14 DIAGNOSIS — IMO0001 Reserved for inherently not codable concepts without codable children: Secondary | ICD-10-CM | POA: Insufficient documentation

## 2013-04-14 DIAGNOSIS — R1011 Right upper quadrant pain: Secondary | ICD-10-CM | POA: Insufficient documentation

## 2013-04-14 LAB — COMPREHENSIVE METABOLIC PANEL
Albumin: 3.9 g/dL (ref 3.5–5.2)
Alkaline Phosphatase: 83 U/L (ref 39–117)
BUN: 9 mg/dL (ref 6–23)
Calcium: 9.5 mg/dL (ref 8.4–10.5)
Creatinine, Ser: 0.72 mg/dL (ref 0.50–1.10)
GFR calc Af Amer: >90 mL/min (ref >90)
Potassium: 3.9 mEq/L (ref 3.5–5.1)
Total Protein: 7.7 g/dL (ref 6.0–8.3)

## 2013-04-14 LAB — CBC WITH DIFFERENTIAL/PLATELET
Basophils Relative: 1 % (ref 0–1)
Eosinophils Absolute: 0.1 10*3/uL (ref 0.0–0.7)
Eosinophils Relative: 2 % (ref 0–5)
Hemoglobin: 14.1 g/dL (ref 12.0–15.0)
MCH: 27.8 pg (ref 26.0–34.0)
MCHC: 32.3 g/dL (ref 30.0–36.0)
MCV: 85.8 fL (ref 78.0–100.0)
Monocytes Relative: 5 % (ref 3–12)
Neutrophils Relative %: 54 % (ref 43–77)

## 2013-04-14 LAB — LIPASE, BLOOD: Lipase: 29 U/L (ref 11–59)

## 2013-04-14 MED ORDER — POLYETHYLENE GLYCOL 3350 17 G PO PACK: 17.0000 g | PACK | Freq: Every day | ORAL | Status: DC

## 2013-04-14 NOTE — ED Provider Notes (Signed)
CSN: [id number]     Arrival date & time 04/14/13  1637 History   First MD Initiated Contact with Patient 04/14/13 1649     Chief Complaint  Patient presents with  . Cough  . Otalgia   (Consider location/radiation/quality/duration/timing/severity/associated sxs/prior Treatment) Patient is a 57 y.o. female presenting with cough and ear pain. The history is provided by the patient. No language interpreter was used.  Cough Cough characteristics:  Productive Associated symptoms: ear pain and rhinorrhea   Associated symptoms: no chest pain, no chills, no fever, no myalgias and no shortness of breath   Associated symptoms comment:  Cough for the past 4 days without fever. She has nasal congestion and left ear pain as well. She reports sick family members with similar symptoms. She also complains of right sided abdominal pain for the past 3 weeks, worse with movement, now worse with coughing. No N, V, fever or change in bowel movements.  Otalgia Associated symptoms: abdominal pain, congestion, cough and rhinorrhea   Associated symptoms: no diarrhea, no fever and no vomiting     Past Medical History  Diagnosis Date  . Diabetes mellitus   . Thyroid disease   . Hypercholesteremia   . Asthma   . Fibromyalgia   . History of shingles     last 2013   Past Surgical History  Procedure Laterality Date  . Thyroidectomy     No family history on file. History  Substance Use Topics  . Smoking status: Current Every Day Smoker -- 0.25 packs/day for 25 years    Types: Cigarettes  . Smokeless tobacco: Not on file  . Alcohol Use: 0.6 oz/week    1 Cans of beer per week     Comment: occasional   OB History   Grav Para Term Preterm Abortions TAB SAB Ect Mult Living                 Review of Systems  Constitutional: Negative for fever and chills.  HENT: Positive for congestion, ear pain and rhinorrhea.   Respiratory: Positive for cough. Negative for shortness of breath.   Cardiovascular:  Negative.  Negative for chest pain.  Gastrointestinal: Positive for abdominal pain. Negative for nausea, vomiting and diarrhea.  Musculoskeletal: Negative.  Negative for myalgias.  Skin: Negative.   Neurological: Negative.     Allergies  Sulfamethoxazole  Home Medications   Current Outpatient Rx  Name  Route  Sig  Dispense  Refill  . albuterol (PROVENTIL HFA;VENTOLIN HFA) 108 (90 BASE) MCG/ACT inhaler   Inhalation   Inhale 2 puffs into the lungs every 6 (six) hours as needed for wheezing.         Marland Kitchen levothyroxine (SYNTHROID, LEVOTHROID) 200 MCG tablet   Oral   Take 200 mcg by mouth daily.         . metFORMIN (GLUCOPHAGE) 500 MG tablet   Oral   Take 500 mg by mouth 2 (two) times daily with a meal.         . rosuvastatin (CRESTOR) 20 MG tablet   Oral   Take 20 mg by mouth at bedtime.          BP 117/78  Pulse 66  Temp(Src) 98.1 F (36.7 C) (Oral)  Resp 18  SpO2 100% Physical Exam  Constitutional: She is oriented to person, place, and time. She appears well-developed and well-nourished.  HENT:  Head: Normocephalic.  Neck: Normal range of motion. Neck supple.  Cardiovascular: Normal rate and regular rhythm.  Pulmonary/Chest: Effort normal and breath sounds normal. She has no wheezes.  Abdominal: Soft. Bowel sounds are normal. There is no tenderness. There is no rebound and no guarding.  RUQ tenderness with mild guarding. No peritoneal signs. Soft abdomen. BS present.   Musculoskeletal: Normal range of motion.  Neurological: She is alert and oriented to person, place, and time.  Skin: Skin is warm and dry. No rash noted.  Psychiatric: She has a normal mood and affect.    ED Course  Procedures (including critical care time) Labs Review Labs Reviewed  CBC WITH DIFFERENTIAL  COMPREHENSIVE METABOLIC PANEL  LIPASE, BLOOD   Results for orders placed during the hospital encounter of 04/14/13  CBC WITH DIFFERENTIAL      Result Value Range   WBC 9.2  4.0 -  10.5 K/uL   RBC 5.08  3.87 - 5.11 MIL/uL   Hemoglobin 14.1  12.0 - 15.0 g/dL   HCT 45.4  09.8 - 11.9 %   MCV 85.8  78.0 - 100.0 fL   MCH 27.8  26.0 - 34.0 pg   MCHC 32.3  30.0 - 36.0 g/dL   RDW 14.7  82.9 - 56.2 %   Platelets 400  150 - 400 K/uL   Neutrophils Relative % 54  43 - 77 %   Neutro Abs 5.0  1.7 - 7.7 K/uL   Lymphocytes Relative 39  12 - 46 %   Lymphs Abs 3.6  0.7 - 4.0 K/uL   Monocytes Relative 5  3 - 12 %   Monocytes Absolute 0.4  0.1 - 1.0 K/uL   Eosinophils Relative 2  0 - 5 %   Eosinophils Absolute 0.1  0.0 - 0.7 K/uL   Basophils Relative 1  0 - 1 %   Basophils Absolute 0.1  0.0 - 0.1 K/uL  COMPREHENSIVE METABOLIC PANEL      Result Value Range   Sodium 140  135 - 145 mEq/L   Potassium 3.9  3.5 - 5.1 mEq/L   Chloride 104  96 - 112 mEq/L   CO2 27  19 - 32 mEq/L   Glucose, Bld 105 (*) 70 - 99 mg/dL   BUN 9  6 - 23 mg/dL   Creatinine, Ser 1.30  0.50 - 1.10 mg/dL   Calcium 9.5  8.4 - 86.5 mg/dL   Total Protein 7.7  6.0 - 8.3 g/dL   Albumin 3.9  3.5 - 5.2 g/dL   AST 12  0 - 37 U/L   ALT 11  0 - 35 U/L   Alkaline Phosphatase 83  39 - 117 U/L   Total Bilirubin 0.1 (*) 0.3 - 1.2 mg/dL   GFR calc non Af Amer >90  >90 mL/min   GFR calc Af Amer >90  >90 mL/min  LIPASE, BLOOD      Result Value Range   Lipase 29  11 - 59 U/L   Dg Abd Acute W/chest  04/14/2013   CLINICAL DATA:  Right side abdominal pain, productive cough  EXAM: ACUTE ABDOMEN SERIES (ABDOMEN 2 VIEW & CHEST 1 VIEW)  COMPARISON:  02/08/2005  FINDINGS: Cardiomediastinal silhouette is unremarkable. No acute infiltrate or pleural effusion. No pulmonary edema. Mild dextroscoliosis thoracic spine.  Mild levoscoliosis lumbar spine. Mild gaseous distended small bowel loop in left mid abdomen. Mild ileus or early bowel obstruction cannot be excluded. Abundant stool in proximal colon. Moderate stool in left colon and sigmoid colon. No free abdominal air.  IMPRESSION: No acute disease within chest. Moderate  stool in  distal colon. Abundant stool proximal colon. Mild gaseous distended bowel loops in left abdomen. Mild ileus or early bowel obstruction cannot be excluded.   Electronically Signed   By: Natasha Mead M.D.   On: 04/14/2013 17:41   Imaging Review No results found.  EKG Interpretation   None       MDM  No diagnosis found. 1. URI 2. Abdominal pain  The patient has had abdominal pain x 3 weeks, worse with movement and now cough, without vomiting or fever. She has no leukocytosis today and has normal and stable VS. Abdominal film questions early bowel obstruction with is unlikely given her presentation, and favor constipation as contributing to persistent abdominal pain. Discussed with dr. Patria Mane - do not feel that CT is warranted. Strict return precautions given.   There is a questionable history of exposure to TB, but the patient has an afebrile illness with a normal chest x-ray. Doubt active TB infection, suspect viral URI with multiple family members affected.     Arnoldo Hooker, PA-C 04/14/13 1900

## 2013-04-14 NOTE — ED Notes (Signed)
Pt. States that she began coughing Sunday night. Pt. Reports productive couch. Pt. Reports left ear ache beginning Sunday night. Pt reports right side abdominal pain from coughing.

## 2013-04-14 NOTE — ED Notes (Addendum)
Pt. States that she was recently around grandson, the grandson had been exposed to a different family member with TB. RN spoke to charge nurse about isolation. Mask placed on Pt. EDP notified.

## 2013-04-15 NOTE — ED Provider Notes (Signed)
Medical screening examination/treatment/procedure(s) were performed by non-physician practitioner and as supervising physician I was immediately available for consultation/collaboration.  EKG Interpretation   None         Macel Yearsley M Josemaria Brining, MD 04/15/13 0032 

## 2013-09-28 ENCOUNTER — Ambulatory Visit (INDEPENDENT_AMBULATORY_CARE_PROVIDER_SITE_OTHER): Payer: Commercial Managed Care - PPO | Admitting: Family Medicine

## 2013-09-28 VITALS — BP 124/76 | HR 76 | Temp 98.3°F | Resp 16 | Ht 63.0 in | Wt 163.4 lb

## 2013-09-28 DIAGNOSIS — G5600 Carpal tunnel syndrome, unspecified upper limb: Secondary | ICD-10-CM

## 2013-09-28 DIAGNOSIS — E119 Type 2 diabetes mellitus without complications: Secondary | ICD-10-CM

## 2013-09-28 DIAGNOSIS — IMO0001 Reserved for inherently not codable concepts without codable children: Secondary | ICD-10-CM

## 2013-09-28 DIAGNOSIS — M79609 Pain in unspecified limb: Secondary | ICD-10-CM

## 2013-09-28 LAB — POCT CBC
GRANULOCYTE PERCENT: 42.6 % (ref 37–80)
HCT, POC: 43.5 % (ref 37.7–47.9)
HEMOGLOBIN: 13.8 g/dL (ref 12.2–16.2)
Lymph, poc: 4.7 — AB (ref 0.6–3.4)
MCH, POC: 27.4 pg (ref 27–31.2)
MCHC: 31.7 g/dL — AB (ref 31.8–35.4)
MCV: 86.4 fL (ref 80–97)
MID (cbc): 0.6 (ref 0–0.9)
MPV: 10.1 fL (ref 0–99.8)
POC GRANULOCYTE: 4 (ref 2–6.9)
POC LYMPH PERCENT: 50.7 %L — AB (ref 10–50)
POC MID %: 6.7 % (ref 0–12)
Platelet Count, POC: 419 10*3/uL (ref 142–424)
RBC: 5.03 M/uL (ref 4.04–5.48)
RDW, POC: 14.7 %
WBC: 9.3 10*3/uL (ref 4.6–10.2)

## 2013-09-28 LAB — POCT SEDIMENTATION RATE: POCT SED RATE: 9 mm/h (ref 0–22)

## 2013-09-28 LAB — POCT GLYCOSYLATED HEMOGLOBIN (HGB A1C): HEMOGLOBIN A1C: 6.9

## 2013-09-28 LAB — GLUCOSE, POCT (MANUAL RESULT ENTRY): POC GLUCOSE: 107 mg/dL — AB (ref 70–99)

## 2013-09-28 MED ORDER — CYCLOBENZAPRINE HCL 10 MG PO TABS: 10.0000 mg | ORAL_TABLET | Freq: Three times a day (TID) | ORAL | Status: DC | PRN

## 2013-09-28 MED ORDER — HYDROCODONE-ACETAMINOPHEN 5-325 MG PO TABS: 1.0000 | ORAL_TABLET | Freq: Four times a day (QID) | ORAL | Status: DC | PRN

## 2013-09-28 NOTE — Progress Notes (Signed)
Subjective:    Patient ID: Melissa Harmon, female    DOB: 1956/05/08, 58 y.o.   MRN: [id number] This chart was scribed for Delman Cheadle, MD by Vernell Barrier, Medical Scribe. The patient was seen in room 8. This patient's care was started at 2:20 PM.  Chief Complaint  Patient presents with  . Otalgia    pain runs down the whole right side x 3 days  . Arm Pain    x3 days -rt    Otalgia  Associated symptoms include neck pain. Pertinent negatives include no coughing, headaches, rash, rhinorrhea or sore throat.  Arm Pain    HPI Comments: Melissa Harmon is a 58 y.o. female w/ hx of musculoskeletal pain due to fibromyalgia and DMII. Presents to the [facility] complaining of waxing and waning right hand pain primarily in the 2nd and 3rd finger that radiates up the wrist and arm and into the neck; onset 3 days ago. Pain in the neck began today. Says pain will intermittently tingle and will feel weakness as if its going to sleep. For the past month or two, states she will hold something in her right hand and then it will fall. Pain wiill wake her up out of her sleep. On Gabapentin and Tramadol for Fibromyalgia. Took last Tramadol pill 5 hours ago. Has also used pain patches that she wraps around her right 2nd and 3rd finger with no relief. Pt is right handed. Works on a [employer], sport and exercise. No hx of neck trauma.  Patient Active Problem List   Diagnosis Date Noted  . DYSURIA 10/25/2009  . ABDOMINAL PAIN, RIGHT UPPER QUADRANT 10/25/2009  . NECK PAIN, LEFT 08/23/2009  . HAND PAIN, RIGHT 08/23/2009  . ELBOW PAIN, BILATERAL 05/17/2009  . TOBACCO USER 05/13/2009  . DIABETES MELLITUS, TYPE II, WITHOUT COMPLICATIONS 02/58/5277  . DYSLIPIDEMIA 12/14/2008  . SHORTNESS OF BREATH 12/14/2008  . RECTAL BLEEDING 04/22/2007  . COLONIC POLYPS, HX OF 04/22/2007  . BLOOD IN STOOL 04/15/2007  . PITYRIASIS ROSEA 08/09/2006  . HYPOTHYROIDISM, UNSPECIFIED 07/08/2006  . GASTROESOPHAGEAL REFLUX, NO  ESOPHAGITIS 07/08/2006  . FIBROMYALGIA, FIBROMYOSITIS 07/08/2006   Past Medical History  Diagnosis Date  . Diabetes mellitus   . Thyroid disease   . Hypercholesteremia   . Asthma   . Fibromyalgia   . History of shingles     last 2013   Past Surgical History  Procedure Laterality Date  . Thyroidectomy     Allergies  Allergen Reactions  . Sulfamethoxazole Hives   Prior to Admission medications   Medication Sig Start Date End Date Taking? Authorizing Provider  albuterol (PROVENTIL HFA;VENTOLIN HFA) 108 (90 BASE) MCG/ACT inhaler Inhale 2 puffs into the lungs every 6 (six) hours as needed for wheezing.   Yes Historical Provider, MD  levothyroxine (SYNTHROID, LEVOTHROID) 200 MCG tablet Take 200 mcg by mouth daily.   Yes Historical Provider, MD  Menthol, Topical Analgesic, (ICY HOT) 5 % PADS Apply 1 patch topically every 12 (twelve) hours as needed (pain).   Yes Historical Provider, MD  metFORMIN (GLUCOPHAGE) 500 MG tablet Take 500 mg by mouth 2 (two) times daily with a meal.   Yes Historical Provider, MD  OVER THE COUNTER MEDICATION 1 tablet daily as needed (allergies).   Yes Historical Provider, MD  polyethylene glycol (MIRALAX) packet Take 17 g by mouth daily. 04/14/13  Yes Shari A Upstill, PA-C  rosuvastatin (CRESTOR) 20 MG tablet Take 20 mg by mouth at bedtime.   Yes Historical  Provider, MD   History   Social History  . Marital Status: Married    Spouse Name: N/A    Number of Children: N/A  . Years of Education: N/A   Occupational History  . Not on file.   Social History Main Topics  . Smoking status: Current Every Day Smoker -- 0.25 packs/day for 25 years    Types: Cigarettes  . Smokeless tobacco: Not on file  . Alcohol Use: 0.6 oz/week    1 Cans of beer per week     Comment: occasional  . Drug Use: No  . Sexual Activity: Not on file   Other Topics Concern  . Not on file   Social History Narrative  . No narrative on file   Review of Systems  Constitutional:  Negative for fever and chills.  HENT: Positive for ear pain. Negative for rhinorrhea and sore throat.   Respiratory: Negative for cough and shortness of breath.   Musculoskeletal: Positive for arthralgias, neck pain and neck stiffness. Negative for back pain, joint swelling and myalgias.  Skin: Negative for color change and rash.  Neurological: Negative for weakness and headaches.  Psychiatric/Behavioral: Negative for behavioral problems.   BP 124/76  Pulse 76  Temp(Src) 98.3 F (36.8 C) (Oral)  Resp 16  Ht 5\' 3"  (1.6 m)  Wt 163 lb 6.4 oz (74.118 kg)  BMI 28.95 kg/m2  SpO2 97%  Objective:  Physical Exam  Vitals reviewed. Constitutional: She is oriented to person, place, and time. She appears well-developed and well-nourished. No distress.  HENT:  Head: Normocephalic and atraumatic.  Eyes: EOM are normal.  Neck: Normal range of motion. Neck supple. No tracheal deviation present.  Cervical pain with flexion but good extension. Full ROM. No cervical spinous process tenderness but is having pain on right paraspinal. Pain over trapezius.   Cardiovascular: Normal rate.   Pulmonary/Chest: Effort normal. No respiratory distress.  Musculoskeletal: Normal range of motion.       Right hand: Decreased strength noted. She exhibits thumb/finger opposition.  Strength 5/5 through all upper extremities. Subtle weakness in opposition on the right. Positive Tinel's on right and left. Positive Phalen's.  Neurological: She is alert and oriented to person, place, and time.  Skin: Skin is warm and dry.  Psychiatric: She has a normal mood and affect. Her behavior is normal.   Results for orders placed in visit on 09/28/13  POCT CBC      Result Value Ref Range   WBC 9.3  4.6 - 10.2 K/uL   Lymph, poc 4.7 (*) 0.6 - 3.4   POC LYMPH PERCENT 50.7 (*) 10 - 50 %L   MID (cbc) 0.6  0 - 0.9   POC MID % 6.7  0 - 12 %M   POC Granulocyte 4.0  2 - 6.9   Granulocyte percent 42.6  37 - 80 %G   RBC 5.03  4.04 -  5.48 M/uL   Hemoglobin 13.8  12.2 - 16.2 g/dL   HCT, POC 43.5  37.7 - 47.9 %   MCV 86.4  80 - 97 fL   MCH, POC 27.4  27 - 31.2 pg   MCHC 31.7 (*) 31.8 - 35.4 g/dL   RDW, POC 14.7     Platelet Count, POC 419  142 - 424 K/uL   MPV 10.1  0 - 99.8 fL  POCT GLYCOSYLATED HEMOGLOBIN (HGB A1C)      Result Value Ref Range   Hemoglobin A1C 6.9    GLUCOSE,  POCT (MANUAL RESULT ENTRY)      Result Value Ref Range   POC Glucose 107 (*) 70 - 99 mg/dl   Assessment & Plan:  Continue on Gabapentin does unknown will not change at this pint. Do not want to do course of prednisone due to DM2. However did discuss with patient that if symptoms persist, will further discuss possibiltiy. Hopefully flexeril with assist with fibromyalgia. Reviewed that hydrocodone was to be used used for acute short term symptoms and not to be refilled.  Problem List Items Addressed This Visit     Endocrine   DIABETES MELLITUS, TYPE II, WITHOUT COMPLICATIONS   Relevant Orders      POCT glycosylated hemoglobin (Hb A1C) (Completed)      POCT glucose (manual entry) (Completed)     Musculoskeletal and Integument   FIBROMYALGIA, FIBROMYOSITIS   Relevant Orders      POCT CBC (Completed)     Other   HAND PAIN, RIGHT - Primary    Other Visit Diagnoses   Carpal tunnel syndrome        Relevant Medications       cyclobenzaprine (FLEXERIL) tablet    Other Relevant Orders       POCT SEDIMENTATION RATE      HAND PAIN, RIGHT  FIBROMYALGIA, FIBROMYOSITIS - Plan: POCT CBC  Carpal tunnel syndrome - Plan: POCT SEDIMENTATION RATE  Type II or unspecified type diabetes mellitus without mention of complication, not stated as uncontrolled - Plan: POCT glycosylated hemoglobin (Hb A1C), POCT glucose (manual entry)  Meds ordered this encounter  Medications  . HYDROcodone-acetaminophen (NORCO/VICODIN) 5-325 MG per tablet    Sig: Take 1 tablet by mouth every 6 (six) hours as needed for moderate pain.    Dispense:  30 tablet     Refill:  0  . cyclobenzaprine (FLEXERIL) 10 MG tablet    Sig: Take 1 tablet (10 mg total) by mouth 3 (three) times daily as needed for muscle spasms.    Dispense:  30 tablet    Refill:  0    I personally performed the services described in this documentation, which was scribed in my presence. The recorded information has been reviewed and considered, and addended by me as needed.  Delman Cheadle, MD MPH

## 2013-09-28 NOTE — Patient Instructions (Signed)

## 2014-01-17 ENCOUNTER — Encounter (HOSPITAL_COMMUNITY): Payer: Self-pay | Admitting: Emergency Medicine

## 2014-01-17 ENCOUNTER — Emergency Department (HOSPITAL_COMMUNITY)
Admission: EM | Admit: 2014-01-17 | Discharge: 2014-01-17 | Disposition: A | Discharge status: Home / Self Care | Payer: Commercial Managed Care - PPO | Attending: Emergency Medicine | Admitting: Emergency Medicine

## 2014-01-17 DIAGNOSIS — Z79899 Other long term (current) drug therapy: Secondary | ICD-10-CM | POA: Insufficient documentation

## 2014-01-17 DIAGNOSIS — E079 Disorder of thyroid, unspecified: Secondary | ICD-10-CM | POA: Insufficient documentation

## 2014-01-17 DIAGNOSIS — M79609 Pain in unspecified limb: Secondary | ICD-10-CM | POA: Diagnosis not present

## 2014-01-17 DIAGNOSIS — R52 Pain, unspecified: Secondary | ICD-10-CM | POA: Diagnosis not present

## 2014-01-17 DIAGNOSIS — F17200 Nicotine dependence, unspecified, uncomplicated: Secondary | ICD-10-CM | POA: Diagnosis not present

## 2014-01-17 DIAGNOSIS — Z8619 Personal history of other infectious and parasitic diseases: Secondary | ICD-10-CM | POA: Insufficient documentation

## 2014-01-17 DIAGNOSIS — G8929 Other chronic pain: Secondary | ICD-10-CM | POA: Diagnosis not present

## 2014-01-17 DIAGNOSIS — IMO0001 Reserved for inherently not codable concepts without codable children: Secondary | ICD-10-CM | POA: Insufficient documentation

## 2014-01-17 DIAGNOSIS — E119 Type 2 diabetes mellitus without complications: Secondary | ICD-10-CM | POA: Diagnosis not present

## 2014-01-17 DIAGNOSIS — M79602 Pain in left arm: Secondary | ICD-10-CM

## 2014-01-17 DIAGNOSIS — M542 Cervicalgia: Secondary | ICD-10-CM | POA: Diagnosis not present

## 2014-01-17 DIAGNOSIS — Z8669 Personal history of other diseases of the nervous system and sense organs: Secondary | ICD-10-CM | POA: Insufficient documentation

## 2014-01-17 DIAGNOSIS — J45909 Unspecified asthma, uncomplicated: Secondary | ICD-10-CM | POA: Diagnosis not present

## 2014-01-17 HISTORY — DX: Carpal tunnel syndrome, unspecified upper limb: G56.00

## 2014-01-17 HISTORY — DX: Other chronic pain: G89.29

## 2014-01-17 HISTORY — DX: Cervicalgia: M54.2

## 2014-01-17 MED ORDER — OXYCODONE-ACETAMINOPHEN 5-325 MG PO TABS: 1.0000 | ORAL_TABLET | ORAL | Status: DC | PRN

## 2014-01-17 MED ORDER — ONDANSETRON 4 MG PO TBDP
4.0000 mg | ORAL_TABLET | Freq: Once | ORAL | Status: AC
  Administered 2014-01-17: 4 mg via ORAL
  Filled 2014-01-17: qty 1

## 2014-01-17 MED ORDER — OXYCODONE-ACETAMINOPHEN 5-325 MG PO TABS
2.0000 | ORAL_TABLET | Freq: Once | ORAL | Status: AC
  Administered 2014-01-17: 2 via ORAL
  Filled 2014-01-17: qty 2

## 2014-01-17 NOTE — Discharge Instructions (Signed)

## 2014-01-17 NOTE — ED Notes (Signed)
Pt c/o L hand pain radiating up arm and into shoulder x 1 weeks.  Pain score 8/10.  Pt reports pain has increased x 3 days.  Denies injury.  Sts "the pain woke up."  Hx of carpal tunnel and fibromyalgia.  Sts she took tramadol w/o relief.

## 2014-01-17 NOTE — ED Provider Notes (Signed)
Medical screening examination/treatment/procedure(s) were performed by non-physician practitioner and as supervising physician I was immediately available for consultation/collaboration.   EKG Interpretation None        [place], DO 01/17/14 2313

## 2014-01-17 NOTE — ED Provider Notes (Signed)
CSN: [id number]     Arrival date & time 01/17/14  1506 History   First MD Initiated Contact with Patient 01/17/14 1642     Chief Complaint  Patient presents with  . Arm Pain  . Hand Pain     (Consider location/radiation/quality/duration/timing/severity/associated sxs/prior Treatment) HPI  [place] is a 58 year old female with a past medical history of chronic neck and left arm radiculopathy which is worsened over the past few days. She has some numbness and tingling in the left hand was not new. She states she is taking tramadol which physician prescribed however it was not working. She's having trouble sleeping. She denies weakness, swelling. She does have some cramping in the left chest bad. Denies chest pain shortness of breath. She has nausea, vomiting or diaphoresis.   Past Medical History  Diagnosis Date  . Diabetes mellitus   . Thyroid disease   . Hypercholesteremia   . Asthma   . Fibromyalgia   . History of shingles     last 2013  . Carpal tunnel syndrome   . Chronic neck pain    Past Surgical History  Procedure Laterality Date  . Thyroidectomy    . Cesarean section     Family History  Problem Relation Age of Onset  . Diabetes Mother   . Heart disease Mother   . Stroke Mother    History  Substance Use Topics  . Smoking status: Current Every Day Smoker -- 0.25 packs/day for 25 years    Types: Cigarettes  . Smokeless tobacco: Not on file  . Alcohol Use: 0.6 oz/week    1 Cans of beer per week     Comment: occasional   OB History   Grav Para Term Preterm Abortions TAB SAB Ect Mult Living                 Review of Systems  Ten systems reviewed and are negative for acute change, except as noted in the HPI.    Allergies  Sulfamethoxazole  Home Medications   Prior to Admission medications   Medication Sig Start Date End Date Taking? Authorizing Provider  albuterol (PROVENTIL HFA;VENTOLIN HFA) 108 (90 BASE) MCG/ACT inhaler Inhale 2 puffs into the  lungs every 6 (six) hours as needed for wheezing.   Yes Historical Provider, MD  gabapentin (NEURONTIN) 100 MG capsule Take 100 mg by mouth 2 (two) times daily.   Yes Historical Provider, MD  levothyroxine (SYNTHROID, LEVOTHROID) 175 MCG tablet Take 175 mcg by mouth daily before breakfast.   Yes Historical Provider, MD  metFORMIN (GLUCOPHAGE) 500 MG tablet Take 750 mg by mouth 2 (two) times daily with a meal.    Yes Historical Provider, MD  pantoprazole (PROTONIX) 40 MG tablet Take 40 mg by mouth daily as needed (for acid reflex).   Yes Historical Provider, MD  traMADol (ULTRAM) 50 MG tablet Take 50 mg by mouth 3 (three) times daily.   Yes Historical Provider, MD   BP 129/77  Pulse 76  Temp(Src) 97.6 F (36.4 C) (Oral)  Resp 16  SpO2 98% Physical Exam  Nursing note and vitals reviewed. Constitutional: She is oriented to person, place, and time. She appears well-developed and well-nourished. No distress.  HENT:  Head: Normocephalic and atraumatic.  Eyes: Conjunctivae are normal. No scleral icterus.  Neck: Normal range of motion.  Cardiovascular: Normal rate, regular rhythm and normal heart sounds.  Exam reveals no gallop and no friction rub.   No murmur heard. Pulmonary/Chest: Effort normal  and breath sounds normal. No respiratory distress.  Abdominal: Soft. Bowel sounds are normal. She exhibits no distension and no mass. There is no tenderness. There is no guarding.  Musculoskeletal:  Left neck pain. Reproducible pain on the left with palpation of the chest wall and scalenes. No weakness. nv intact.   Neurological: She is alert and oriented to person, place, and time.  Skin: Skin is warm and dry. She is not diaphoretic.  Psychiatric: Her behavior is normal.    ED Course  Procedures (including critical care time) Labs Review Labs Reviewed - No data to display  Imaging Review No results found.   EKG Interpretation None      MDM   Final diagnoses:  Chronic neck pain   Chronic pain of left upper extremity    Patient with acute on chronic cervical radiculopathy. No new neuro sxs.  will d.c with pain meds. F/u with pcp    Margarita Mail, PA-C 01/17/14 1745

## 2014-01-31 ENCOUNTER — Other Ambulatory Visit: Payer: Self-pay

## 2014-01-31 DIAGNOSIS — Z1231 Encounter for screening mammogram for malignant neoplasm of breast: Secondary | ICD-10-CM

## 2014-02-23 ENCOUNTER — Encounter (INDEPENDENT_AMBULATORY_CARE_PROVIDER_SITE_OTHER): Payer: Self-pay

## 2014-02-23 ENCOUNTER — Ambulatory Visit
Admission: RE | Admit: 2014-02-23 | Discharge: 2014-02-23 | Disposition: A | Discharge status: Home / Self Care | Payer: Commercial Managed Care - PPO | Source: Ambulatory Visit

## 2014-02-23 DIAGNOSIS — Z1231 Encounter for screening mammogram for malignant neoplasm of breast: Secondary | ICD-10-CM

## 2014-12-26 ENCOUNTER — Encounter (HOSPITAL_COMMUNITY): Payer: Self-pay

## 2014-12-26 ENCOUNTER — Emergency Department (HOSPITAL_COMMUNITY)
Admission: EM | Admit: 2014-12-26 | Discharge: 2014-12-26 | Disposition: A | Discharge status: Home / Self Care | Payer: Commercial Managed Care - PPO | Attending: Emergency Medicine | Admitting: Emergency Medicine

## 2014-12-26 ENCOUNTER — Emergency Department (HOSPITAL_COMMUNITY): Payer: Commercial Managed Care - PPO

## 2014-12-26 DIAGNOSIS — Z79899 Other long term (current) drug therapy: Secondary | ICD-10-CM | POA: Insufficient documentation

## 2014-12-26 DIAGNOSIS — Z8619 Personal history of other infectious and parasitic diseases: Secondary | ICD-10-CM | POA: Insufficient documentation

## 2014-12-26 DIAGNOSIS — J45909 Unspecified asthma, uncomplicated: Secondary | ICD-10-CM | POA: Diagnosis not present

## 2014-12-26 DIAGNOSIS — E119 Type 2 diabetes mellitus without complications: Secondary | ICD-10-CM | POA: Diagnosis not present

## 2014-12-26 DIAGNOSIS — E079 Disorder of thyroid, unspecified: Secondary | ICD-10-CM | POA: Diagnosis not present

## 2014-12-26 DIAGNOSIS — E780 Pure hypercholesterolemia: Secondary | ICD-10-CM | POA: Diagnosis not present

## 2014-12-26 DIAGNOSIS — R1012 Left upper quadrant pain: Secondary | ICD-10-CM | POA: Diagnosis not present

## 2014-12-26 DIAGNOSIS — G8929 Other chronic pain: Secondary | ICD-10-CM | POA: Insufficient documentation

## 2014-12-26 DIAGNOSIS — R300 Dysuria: Secondary | ICD-10-CM | POA: Insufficient documentation

## 2014-12-26 DIAGNOSIS — R1031 Right lower quadrant pain: Secondary | ICD-10-CM | POA: Insufficient documentation

## 2014-12-26 DIAGNOSIS — Z720 Tobacco use: Secondary | ICD-10-CM | POA: Insufficient documentation

## 2014-12-26 DIAGNOSIS — M545 Low back pain, unspecified: Secondary | ICD-10-CM

## 2014-12-26 DIAGNOSIS — R110 Nausea: Secondary | ICD-10-CM | POA: Insufficient documentation

## 2014-12-26 DIAGNOSIS — R1011 Right upper quadrant pain: Secondary | ICD-10-CM | POA: Insufficient documentation

## 2014-12-26 DIAGNOSIS — R109 Unspecified abdominal pain: Secondary | ICD-10-CM

## 2014-12-26 LAB — COMPREHENSIVE METABOLIC PANEL
ALK PHOS: 59 U/L (ref 38–126)
ALT: 23 U/L (ref 14–54)
ANION GAP: 7 (ref 5–15)
AST: 15 U/L (ref 15–41)
Albumin: 3.9 g/dL (ref 3.5–5.0)
BILIRUBIN TOTAL: 0.5 mg/dL (ref 0.3–1.2)
BUN: 16 mg/dL (ref 6–20)
CALCIUM: 9 mg/dL (ref 8.9–10.3)
CO2: 26 mmol/L (ref 22–32)
Chloride: 106 mmol/L (ref 101–111)
Creatinine, Ser: 0.64 mg/dL (ref 0.44–1.00)
Glucose, Bld: 153 mg/dL — ABNORMAL HIGH (ref 65–99)
Potassium: 4 mmol/L (ref 3.5–5.1)
SODIUM: 139 mmol/L (ref 135–145)
TOTAL PROTEIN: 7.1 g/dL (ref 6.5–8.1)

## 2014-12-26 LAB — CBC WITH DIFFERENTIAL/PLATELET
Basophils Absolute: 0 10*3/uL (ref 0.0–0.1)
Basophils Relative: 0 % (ref 0–1)
EOS ABS: 0.1 10*3/uL (ref 0.0–0.7)
Eosinophils Relative: 2 % (ref 0–5)
HCT: 42.9 % (ref 36.0–46.0)
HEMOGLOBIN: 13.7 g/dL (ref 12.0–15.0)
LYMPHS ABS: 2.9 10*3/uL (ref 0.7–4.0)
Lymphocytes Relative: 35 % (ref 12–46)
MCH: 28.1 pg (ref 26.0–34.0)
MCHC: 31.9 g/dL (ref 30.0–36.0)
MCV: 88.1 fL (ref 78.0–100.0)
MONOS PCT: 6 % (ref 3–12)
Monocytes Absolute: 0.5 10*3/uL (ref 0.1–1.0)
NEUTROS PCT: 57 % (ref 43–77)
Neutro Abs: 4.6 10*3/uL (ref 1.7–7.7)
Platelets: 346 10*3/uL (ref 150–400)
RBC: 4.87 MIL/uL (ref 3.87–5.11)
RDW: 14.1 % (ref 11.5–15.5)
WBC: 8.1 10*3/uL (ref 4.0–10.5)

## 2014-12-26 LAB — URINALYSIS, ROUTINE W REFLEX MICROSCOPIC
BILIRUBIN URINE: NEGATIVE
Glucose, UA: NEGATIVE mg/dL
HGB URINE DIPSTICK: NEGATIVE
Ketones, ur: NEGATIVE mg/dL
Leukocytes, UA: NEGATIVE
NITRITE: NEGATIVE
PH: 5.5 (ref 5.0–8.0)
Protein, ur: NEGATIVE mg/dL
SPECIFIC GRAVITY, URINE: 1.025 (ref 1.005–1.030)
Urobilinogen, UA: 0.2 mg/dL (ref 0.0–1.0)

## 2014-12-26 LAB — LIPASE, BLOOD: LIPASE: 26 U/L (ref 22–51)

## 2014-12-26 MED ORDER — GI COCKTAIL ~~LOC~~
30.0000 mL | Freq: Once | ORAL | Status: AC
  Administered 2014-12-26: 30 mL via ORAL
  Filled 2014-12-26: qty 30

## 2014-12-26 MED ORDER — ONDANSETRON HCL 4 MG/2ML IJ SOLN
4.0000 mg | Freq: Once | INTRAMUSCULAR | Status: AC
  Administered 2014-12-26: 4 mg via INTRAVENOUS
  Filled 2014-12-26: qty 2

## 2014-12-26 MED ORDER — OMEPRAZOLE 20 MG PO CPDR: 20.0000 mg | DELAYED_RELEASE_CAPSULE | Freq: Every day | ORAL | Status: DC

## 2014-12-26 NOTE — ED Notes (Addendum)
Patient c/o right back pain that radiates down the right leg.  Patient also c/o righ lower abdominal pain x 1 week. Patient states she has been voiding small amounts and having frequency.

## 2014-12-26 NOTE — Discharge Instructions (Signed)
Abdominal Pain, Women Abdominal (stomach, pelvic, or belly) pain can be caused by many things. It is important to tell your doctor:  The location of the pain.  Does it come and go or is it present all the time?  Are there things that start the pain (eating certain foods, exercise)?  Are there other symptoms associated with the pain (fever, nausea, vomiting, diarrhea)? All of this is helpful to know when trying to find the cause of the pain. CAUSES   Stomach: virus or bacteria infection, or ulcer.  Intestine: appendicitis (inflamed appendix), regional ileitis (Crohn's disease), ulcerative colitis (inflamed colon), irritable bowel syndrome, diverticulitis (inflamed diverticulum of the colon), or cancer of the stomach or intestine.  Gallbladder disease or stones in the gallbladder.  Kidney disease, kidney stones, or infection.  Pancreas infection or cancer.  Fibromyalgia (pain disorder).  Diseases of the female organs:  Uterus: fibroid (non-cancerous) tumors or infection.  Fallopian tubes: infection or tubal pregnancy.  Ovary: cysts or tumors.  Pelvic adhesions (scar tissue).  Endometriosis (uterus lining tissue growing in the pelvis and on the pelvic organs).  Pelvic congestion syndrome (female organs filling up with blood just before the menstrual period).  Pain with the menstrual period.  Pain with ovulation (producing an egg).  Pain with an IUD (intrauterine device, birth control) in the uterus.  Cancer of the female organs.  Functional pain (pain not caused by a disease, may improve without treatment).  Psychological pain.  Depression. DIAGNOSIS  Your doctor will decide the seriousness of your pain by doing an examination.  Blood tests.  X-rays.  Ultrasound.  CT scan (computed tomography, special type of X-ray).  MRI (magnetic resonance imaging).  Cultures, for infection.  Barium enema (dye inserted in the large intestine, to better view it with  X-rays).  Colonoscopy (looking in intestine with a lighted tube).  Laparoscopy (minor surgery, looking in abdomen with a lighted tube).  Major abdominal exploratory surgery (looking in abdomen with a large incision). TREATMENT  The treatment will depend on the cause of the pain.   Many cases can be observed and treated at home.  Over-the-counter medicines recommended by your caregiver.  Prescription medicine.  Antibiotics, for infection.  Birth control pills, for painful periods or for ovulation pain.  Hormone treatment, for endometriosis.  Nerve blocking injections.  Physical therapy.  Antidepressants.  Counseling with a psychologist or psychiatrist.  Minor or major surgery. HOME CARE INSTRUCTIONS   Do not take laxatives, unless directed by your caregiver.  Take over-the-counter pain medicine only if ordered by your caregiver. Do not take aspirin because it can cause an upset stomach or bleeding.  Try a clear liquid diet (broth or water) as ordered by your caregiver. Slowly move to a bland diet, as tolerated, if the pain is related to the stomach or intestine.  Have a thermometer and take your temperature several times a day, and record it.  Bed rest and sleep, if it helps the pain.  Avoid sexual intercourse, if it causes pain.  Avoid stressful situations.  Keep your follow-up appointments and tests, as your caregiver orders.  If the pain does not go away with medicine or surgery, you may try:  Acupuncture.  Relaxation exercises (yoga, meditation).  Group therapy.  Counseling. SEEK MEDICAL CARE IF:   You notice certain foods cause stomach pain.  Your home care treatment is not helping your pain.  You need stronger pain medicine.  You want your IUD removed.  You feel faint or  lightheaded.  You develop nausea and vomiting.  You develop a rash.  You are having side effects or an allergy to your medicine. SEEK IMMEDIATE MEDICAL CARE IF:   Your  pain does not go away or gets worse.  You have a fever.  Your pain is felt only in portions of the abdomen. The right side could possibly be appendicitis. The left lower portion of the abdomen could be colitis or diverticulitis.  You are passing blood in your stools (bright red or black tarry stools, with or without vomiting).  You have blood in your urine.  You develop chills, with or without a fever.  You pass out. MAKE SURE YOU:   Understand these instructions.  Will watch your condition.  Will get help right away if you are not doing well or get worse. Document Released: 02/22/2007 Document Revised: 09/11/2013 Document Reviewed: 03/14/2009 [facility] Patient Information 2015 [place], [place]. This information is not intended to replace advice given to you by your health care provider. Make sure you discuss any questions you have with your health care provider.  Back Pain, Adult Back pain is very common. The pain often gets better over time. The cause of back pain is usually not dangerous. Most people can learn to manage their back pain on their own.  HOME CARE   Stay active. Start with short walks on flat ground if you can. Try to walk farther each day.  Do not sit, drive, or stand in one place for more than 30 minutes. Do not stay in bed.  Do not avoid exercise or work. Activity can help your back heal faster.  Be careful when you bend or lift an object. Bend at your knees, keep the object close to you, and do not twist.  Sleep on a firm mattress. Lie on your side, and bend your knees. If you lie on your back, put a pillow under your knees.  Only take medicines as told by your doctor.  Put ice on the injured area.  Put ice in a plastic bag.  Place a towel between your skin and the bag.  Leave the ice on for 15-20 minutes, 03-04 times a day for the first 2 to 3 days. After that, you can switch between ice and heat packs.  Ask your doctor about back exercises or  massage.  Avoid feeling anxious or stressed. Find good ways to deal with stress, such as exercise. GET HELP RIGHT AWAY IF:   Your pain does not go away with rest or medicine.  Your pain does not go away in 1 week.  You have new problems.  You do not feel well.  The pain spreads into your legs.  You cannot control when you poop (bowel movement) or pee (urinate).  Your arms or legs feel weak or lose feeling (numbness).  You feel sick to your stomach (nauseous) or throw up (vomit).  You have belly (abdominal) pain.  You feel like you may pass out (faint). MAKE SURE YOU:   Understand these instructions.  Will watch your condition.  Will get help right away if you are not doing well or get worse. Document Released: 10/14/2007 Document Revised: 07/20/2011 Document Reviewed: 08/29/2013 [facility] Patient Information 2015 [place], [place]. This information is not intended to replace advice given to you by your health care provider. Make sure you discuss any questions you have with your health care provider.

## 2014-12-26 NOTE — ED Provider Notes (Signed)
CSN: [id number]     Arrival date & time 12/26/14  0912 History   First MD Initiated Contact with Patient 12/26/14 1024     Chief Complaint  Patient presents with  . Back Pain  . Abdominal Pain     (Consider location/radiation/quality/duration/timing/severity/associated sxs/prior Treatment) Patient is a 59 y.o. female presenting with abdominal pain.  Abdominal Pain Pain location:  RUQ and RLQ Pain quality: sharp   Pain radiation: down right leg. Pain severity:  Moderate Onset quality:  Gradual Duration:  1 week Timing:  Constant Progression:  Worsening Chronicity:  New Relieved by:  Nothing Exacerbated by: not worse with eating. Associated symptoms: dysuria and nausea   Associated symptoms: no constipation, no diarrhea, no fever and no vomiting   Associated symptoms comment:  Urinary frequency   Past Medical History  Diagnosis Date  . Diabetes mellitus   . Thyroid disease   . Hypercholesteremia   . Asthma   . Fibromyalgia   . History of shingles     last 2013  . Carpal tunnel syndrome   . Chronic neck pain    Past Surgical History  Procedure Laterality Date  . Thyroidectomy    . Cesarean section     Family History  Problem Relation Age of Onset  . Diabetes Mother   . Heart disease Mother   . Stroke Mother    Social History  Substance Use Topics  . Smoking status: Current Every Day Smoker -- 0.25 packs/day for 25 years    Types: Cigarettes  . Smokeless tobacco: Never Used  . Alcohol Use: 0.6 oz/week    1 Cans of beer per week     Comment: occasional   OB History    No data available     Review of Systems  Constitutional: Negative for fever.  Gastrointestinal: Positive for nausea and abdominal pain. Negative for vomiting, diarrhea and constipation.  Genitourinary: Positive for dysuria.  All other systems reviewed and are negative.     Allergies  Sulfamethoxazole  Home Medications   Prior to Admission medications   Medication Sig Start Date  End Date Taking? Authorizing Provider  albuterol (PROVENTIL HFA;VENTOLIN HFA) 108 (90 BASE) MCG/ACT inhaler Inhale 2 puffs into the lungs every 6 (six) hours as needed for wheezing.   Yes Historical Provider, MD  levothyroxine (SYNTHROID, LEVOTHROID) 175 MCG tablet Take 175 mcg by mouth daily before breakfast.   Yes Historical Provider, MD  metFORMIN (GLUCOPHAGE) 500 MG tablet Take 750 mg by mouth 2 (two) times daily with a meal.    Yes Historical Provider, MD  pantoprazole (PROTONIX) 40 MG tablet Take 40 mg by mouth daily as needed (for acid reflex).   Yes Historical Provider, MD  rosuvastatin (CRESTOR) 10 MG tablet Take 10 mg by mouth daily.   Yes Historical Provider, MD  oxyCODONE-acetaminophen (PERCOCET) 5-325 MG per tablet Take 1-2 tablets by mouth every 4 (four) hours as needed. 01/17/14   Abigail Harris, PA-C   BP 94/60 mmHg  Pulse 64  Temp(Src) 98.4 F (36.9 C) (Oral)  Resp 17  Wt 163 lb (73.936 kg)  SpO2 100% Physical Exam  Constitutional: She is oriented to person, place, and time. She appears well-developed and well-nourished. No distress.  HENT:  Head: Normocephalic and atraumatic.  Mouth/Throat: Oropharynx is clear and moist.  Eyes: Conjunctivae are normal. Pupils are equal, round, and reactive to light. No scleral icterus.  Neck: Neck supple.  Cardiovascular: Normal rate, regular rhythm, normal heart sounds and intact distal pulses.  No murmur heard. Pulmonary/Chest: Effort normal and breath sounds normal. No stridor. No respiratory distress. She has no rales.  Abdominal: Soft. Bowel sounds are normal. She exhibits no distension. There is tenderness (worst in RUQ) in the right upper quadrant, right lower quadrant and left upper quadrant. There is CVA tenderness (right). There is no rigidity, no rebound and no guarding.  Musculoskeletal: Normal range of motion.  Neurological: She is alert and oriented to person, place, and time.  Skin: Skin is warm and dry. No rash noted.   Psychiatric: She has a normal mood and affect. Her behavior is normal.  Nursing note and vitals reviewed.   ED Course  Procedures (including critical care time) Labs Review Labs Reviewed  URINALYSIS, ROUTINE W REFLEX MICROSCOPIC (NOT AT [facility]) - Abnormal; Notable for the following:    APPearance CLOUDY (*)    All other components within normal limits  COMPREHENSIVE METABOLIC PANEL - Abnormal; Notable for the following:    Glucose, Bld 153 (*)    All other components within normal limits  CBC WITH DIFFERENTIAL/PLATELET  LIPASE, BLOOD    Imaging Review US Abdomen Complete  12/26/2014   CLINICAL DATA:  Right upper quadrant pain for 1 week.  Nausea.  EXAM: ULTRASOUND ABDOMEN COMPLETE  COMPARISON:  None.  FINDINGS: Gallbladder: No gallstones or wall thickening visualized. No sonographic Murphy sign noted.  Common bile duct: Diameter: Normal caliber, 4 mm  Liver: No focal lesion identified. Within normal limits in parenchymal echogenicity.  IVC: No abnormality visualized.  Pancreas: Visualized portion unremarkable.  Spleen: Size and appearance within normal limits.  Right Kidney: Length: 9.7 cm. Echogenicity within normal limits. No mass or hydronephrosis visualized.  Left Kidney: Length: 9.4 cm. Echogenicity within normal limits. No mass or hydronephrosis visualized.  Abdominal aorta: No aneurysm visualized.  Other findings: None.  IMPRESSION: Unremarkable abdominal ultrasound.   Electronically Signed   By: Rolm Baptise M.D.   On: 12/26/2014 12:01     EKG Interpretation None      MDM   Final diagnoses:  Right sided abdominal pain  Right-sided low back pain without sciatica    She got significant relief with GI cocktail.  [place] negative.  Labs unremarkable.  Her back pain appears MSK.  No incontinence, fevers, LE weakness/numbness, or saddle anesthesia.  Advised PPI for abd pain and supportive care for back pain.     Serita Grit, MD 12/26/14 1745

## 2014-12-26 NOTE — ED Notes (Signed)
Patient states she has had right flank pain radiating to RLQ x1 week.  Patient states movement makes pain worse and heat helps the pain.  Patient has taken ibuprofen to treat with no relief.  Patient endorses nausea, but denies V/D and fever.  Patient states the pain is somewhat worse just after urination.  Patient denies dysuria and hematuria.  On exam, patient's lung sounds are clear to auscultation.  Heart sounds WNL, S1/S2, no rub, murmur or gallop.  Patient's abdomen is soft and non-tender to palpation.  Bowel sounds present, but hypoactive.  CVA tenderness noted with palpation to right flank.

## 2015-03-13 ENCOUNTER — Emergency Department (HOSPITAL_COMMUNITY): Payer: Commercial Managed Care - PPO

## 2015-03-13 ENCOUNTER — Emergency Department (HOSPITAL_COMMUNITY)
Admission: EM | Admit: 2015-03-13 | Discharge: 2015-03-13 | Disposition: A | Discharge status: Home / Self Care | Payer: Commercial Managed Care - PPO | Attending: Emergency Medicine | Admitting: Emergency Medicine

## 2015-03-13 ENCOUNTER — Inpatient Hospital Stay (HOSPITAL_COMMUNITY)
Admission: AD | Admit: 2015-03-13 | Discharge: 2015-03-13 | Discharge status: Against Medical Advice | Payer: Commercial Managed Care - PPO | Source: Ambulatory Visit | Attending: Obstetrics and Gynecology | Admitting: Obstetrics and Gynecology

## 2015-03-13 ENCOUNTER — Encounter (HOSPITAL_COMMUNITY): Payer: Self-pay | Admitting: Emergency Medicine

## 2015-03-13 ENCOUNTER — Other Ambulatory Visit: Payer: Self-pay

## 2015-03-13 DIAGNOSIS — Z79899 Other long term (current) drug therapy: Secondary | ICD-10-CM | POA: Insufficient documentation

## 2015-03-13 DIAGNOSIS — R079 Chest pain, unspecified: Secondary | ICD-10-CM | POA: Diagnosis present

## 2015-03-13 DIAGNOSIS — E119 Type 2 diabetes mellitus without complications: Secondary | ICD-10-CM | POA: Insufficient documentation

## 2015-03-13 DIAGNOSIS — K219 Gastro-esophageal reflux disease without esophagitis: Secondary | ICD-10-CM

## 2015-03-13 LAB — BASIC METABOLIC PANEL
Anion gap: 7 (ref 5–15)
BUN: 9 mg/dL (ref 6–20)
CHLORIDE: 107 mmol/L (ref 101–111)
CO2: 24 mmol/L (ref 22–32)
Calcium: 8.8 mg/dL — ABNORMAL LOW (ref 8.9–10.3)
Creatinine, Ser: 0.64 mg/dL (ref 0.44–1.00)
GFR calc Af Amer: >60 mL/min (ref >60)
GFR calc non Af Amer: >60 mL/min (ref >60)
Glucose, Bld: 143 mg/dL — ABNORMAL HIGH (ref 65–99)
POTASSIUM: 3.6 mmol/L (ref 3.5–5.1)
SODIUM: 138 mmol/L (ref 135–145)

## 2015-03-13 LAB — CBC
HEMATOCRIT: 40 % (ref 36.0–46.0)
Hemoglobin: 13.2 g/dL (ref 12.0–15.0)
MCH: 28.4 pg (ref 26.0–34.0)
MCHC: 33 g/dL (ref 30.0–36.0)
MCV: 86 fL (ref 78.0–100.0)
Platelets: 382 10*3/uL (ref 150–400)
RBC: 4.65 MIL/uL (ref 3.87–5.11)
RDW: 13.9 % (ref 11.5–15.5)
WBC: 6.5 10*3/uL (ref 4.0–10.5)

## 2015-03-13 LAB — I-STAT TROPONIN, ED: Troponin i, poc: 0 ng/mL (ref 0.00–0.08)

## 2015-03-13 MED ORDER — GI COCKTAIL ~~LOC~~
30.0000 mL | Freq: Once | ORAL | Status: AC
  Administered 2015-03-13: 30 mL via ORAL
  Filled 2015-03-13: qty 30

## 2015-03-13 MED ORDER — PANTOPRAZOLE SODIUM 40 MG PO TBEC: 40.0000 mg | DELAYED_RELEASE_TABLET | Freq: Two times a day (BID) | ORAL | Status: DC

## 2015-03-13 NOTE — ED Notes (Signed)
Pt states that she having burning in her center of her chest that started on Friday. Pt states that is worse when she lies down.

## 2015-03-13 NOTE — ED Provider Notes (Signed)
CSN: [id number]     Arrival date & time 03/13/15  1239 History   First MD Initiated Contact with Patient 03/13/15 1321     Chief Complaint  Patient presents with  . Chest Pain     HPI Patient presents to the emergency department complaining of a burning sensation in her chest over the past 3 days.  No prior history of cardiac disease or lung disease.  No difficulty breathing.  She states it's worse when she lies flat.  She has not tried any medications for this.  She reports that the burning radiates up and down her anterior midline chest without radiation of this shoulders or jaw.  No other complaints at this time.   Past Medical History  Diagnosis Date  . Diabetes mellitus   . Thyroid disease   . Hypercholesteremia   . Asthma   . Fibromyalgia   . History of shingles     last 2013  . Carpal tunnel syndrome   . Chronic neck pain    Past Surgical History  Procedure Laterality Date  . Thyroidectomy    . Cesarean section     Family History  Problem Relation Age of Onset  . Diabetes Mother   . Heart disease Mother   . Stroke Mother    Social History  Substance Use Topics  . Smoking status: Current Every Day Smoker -- 0.25 packs/day for 25 years    Types: Cigarettes  . Smokeless tobacco: Never Used  . Alcohol Use: 0.6 oz/week    1 Cans of beer per week     Comment: occasional   OB History    No data available     Review of Systems  All other systems reviewed and are negative.     Allergies  Sulfamethoxazole  Home Medications   Prior to Admission medications   Medication Sig Start Date End Date Taking? Authorizing Provider  albuterol (PROVENTIL HFA;VENTOLIN HFA) 108 (90 BASE) MCG/ACT inhaler Inhale 2 puffs into the lungs every 6 (six) hours as needed for wheezing.   Yes Historical Provider, MD  atorvastatin (LIPITOR) 20 MG tablet Take 20 mg by mouth daily.   Yes Historical Provider, MD  gabapentin (NEURONTIN) 100 MG capsule Take 100 mg by mouth at bedtime.    Yes Historical Provider, MD  levothyroxine (SYNTHROID, LEVOTHROID) 175 MCG tablet Take 175 mcg by mouth daily before breakfast.   Yes Historical Provider, MD  metFORMIN (GLUCOPHAGE) 500 MG tablet Take 750 mg by mouth 2 (two) times daily with a meal.    Yes Historical Provider, MD  senna (SENOKOT) 8.6 MG TABS tablet Take 1 tablet by mouth daily as needed for mild constipation.   Yes Historical Provider, MD  pantoprazole (PROTONIX) 40 MG tablet Take 1 tablet (40 mg total) by mouth 2 (two) times daily. 03/13/15   Jola Schmidt, MD   BP 132/75 mmHg  Pulse 79  Temp(Src) 98.5 F (36.9 C) (Oral)  Resp 16  SpO2 97% Physical Exam  Constitutional: She is oriented to person, place, and time. She appears well-developed and well-nourished. No distress.  HENT:  Head: Normocephalic and atraumatic.  Eyes: EOM are normal.  Neck: Normal range of motion.  Cardiovascular: Normal rate, regular rhythm and normal heart sounds.   Pulmonary/Chest: Effort normal and breath sounds normal.  Abdominal: Soft. She exhibits no distension. There is no tenderness.  Musculoskeletal: Normal range of motion.  Neurological: She is alert and oriented to person, place, and time.  Skin: Skin is warm  and dry.  Psychiatric: She has a normal mood and affect. Judgment normal.  Nursing note and vitals reviewed.   ED Course  Procedures (including critical care time) Labs Review Labs Reviewed  BASIC METABOLIC PANEL - Abnormal; Notable for the following:    Glucose, Bld 143 (*)    Calcium 8.8 (*)    All other components within normal limits  CBC  I-STAT TROPOININ, ED    Imaging Review Dg Chest 2 View  03/13/2015  CLINICAL DATA:  Chest pain EXAM: CHEST  2 VIEW COMPARISON:  04/14/2013 FINDINGS: Borderline cardiomegaly. Clear lungs. Normal vascularity. No pneumothorax. No pleural effusion. Slight scoliosis in the spine is stable. IMPRESSION: Borderline cardiomegaly without decompensation. Electronically Signed   By: Marybelle Killings M.D.   On: 03/13/2015 13:37   I have personally reviewed and evaluated these images and lab results as part of my medical decision-making.   EKG Interpretation   Date/Time:  Wednesday March 13 2015 12:38:26 EDT Ventricular Rate:  79 PR Interval:  156 QRS Duration: 80 QT Interval:  412 QTC Calculation: 472 R Axis:   32 Text Interpretation:  Normal sinus rhythm Normal ECG No significant change  was found Confirmed by Deke Tilghman  MD, Ladislao Cohenour ([id number]) on 03/13/2015 3:26:28 PM      MDM   Final diagnoses:  Chest pain, unspecified chest pain type  Gastroesophageal reflux disease without esophagitis    Patient feels much better after GI cocktail.  Suspect gastroesophageal reflux disease.  Doubt PE.  Doubt ACS.  Home with Protonix.  Primary care follow-up.  Patient understands to return to the ER for new or worsening symptoms    Jola Schmidt, MD 03/13/15 1527

## 2015-03-13 NOTE — Progress Notes (Signed)
Patient states she is going to leave and go to another facility. Apology to patient for wait time. Informed patient she will be seen as soon as possible. Patient states, "I came to the wrong hospital."

## 2015-03-13 NOTE — Discharge Instructions (Signed)
Heartburn Heartburn is a type of pain or discomfort that can happen in the throat or chest. It is often described as a burning pain. It may also cause a bad taste in the mouth. Heartburn may feel worse when you lie down or bend over, and it is often worse at night. Heartburn may be caused by stomach contents that move back up into the esophagus (reflux). HOME CARE INSTRUCTIONS Take these actions to decrease your discomfort and to help avoid complications. Diet  Follow a diet as recommended by your health care provider. This may involve avoiding foods and drinks such as:  Coffee and tea (with or without caffeine).  Drinks that contain alcohol.  Energy drinks and sports drinks.  Carbonated drinks or sodas.  Chocolate and cocoa.  Peppermint and mint flavorings.  Garlic and onions.  Horseradish.  Spicy and acidic foods, including peppers, chili powder, curry powder, vinegar, hot sauces, and barbecue sauce.  Citrus fruit juices and citrus fruits, such as oranges, lemons, and limes.  Tomato-based foods, such as red sauce, chili, salsa, and pizza with red sauce.  Fried and fatty foods, such as donuts, french fries, potato chips, and high-fat dressings.  High-fat meats, such as hot dogs and fatty cuts of red and [place] meats, such as rib eye steak, sausage, ham, and bacon.  High-fat dairy items, such as whole milk, butter, and cream cheese.  Eat small, frequent meals instead of large meals.  Avoid drinking large amounts of liquid with your meals.  Avoid eating meals during the 2-3 hours before bedtime.  Avoid lying down right after you eat.  Do not exercise right after you eat. General Instructions  Pay attention to any changes in your symptoms.  Take over-the-counter and prescription medicines only as told by your health care provider. Do not take aspirin, ibuprofen, or other NSAIDs unless your health care provider told you to do so.  Do not use any tobacco products,  including cigarettes, chewing tobacco, and e-cigarettes. If you need help quitting, ask your health care provider.  Wear loose-fitting clothing. Do not wear anything tight around your waist that causes pressure on your abdomen.  Raise (elevate) the head of your bed about 6 inches (15 cm).  Try to reduce your stress, such as with yoga or meditation. If you need help reducing stress, ask your health care provider.  If you are overweight, reduce your weight to an amount that is healthy for you. Ask your health care provider for guidance about a safe weight loss goal.  Keep all follow-up visits as told by your health care provider. This is important. SEEK MEDICAL CARE IF:  You have new symptoms.  You have unexplained weight loss.  You have difficulty swallowing, or it hurts to swallow.  You have wheezing or a persistent cough.  Your symptoms do not improve with treatment.  You have frequent heartburn for more than two weeks. SEEK IMMEDIATE MEDICAL CARE IF:  You have pain in your arms, neck, jaw, teeth, or back.  You feel sweaty, dizzy, or light-headed.  You have chest pain or shortness of breath.  You vomit and your vomit looks like blood or coffee grounds.  Your stool is bloody or black.   This information is not intended to replace advice given to you by your health care provider. Make sure you discuss any questions you have with your health care provider.   Document Released: 09/13/2008 Document Revised: 01/16/2015 Document Reviewed: 08/22/2014 Elsevier Interactive Patient Education [employer].

## 2015-03-13 NOTE — MAU Note (Signed)
Pt presents to MAU with complaints of generalized body aches.

## 2015-03-13 NOTE — ED Notes (Signed)
EKG completed in triage, nurse Jana Half stated she gave to MD

## 2016-05-13 ENCOUNTER — Ambulatory Visit
Admission: RE | Admit: 2016-05-13 | Discharge: 2016-05-13 | Disposition: A | Discharge status: Home / Self Care | Payer: Commercial Managed Care - PPO | Source: Ambulatory Visit | Attending: Internal Medicine | Admitting: Internal Medicine

## 2016-05-13 ENCOUNTER — Other Ambulatory Visit: Payer: Self-pay | Admitting: Internal Medicine

## 2016-05-13 DIAGNOSIS — M19031 Primary osteoarthritis, right wrist: Secondary | ICD-10-CM

## 2016-05-26 ENCOUNTER — Ambulatory Visit (INDEPENDENT_AMBULATORY_CARE_PROVIDER_SITE_OTHER): Payer: Commercial Managed Care - PPO

## 2016-05-26 ENCOUNTER — Ambulatory Visit (INDEPENDENT_AMBULATORY_CARE_PROVIDER_SITE_OTHER): Payer: Commercial Managed Care - PPO | Admitting: Physician Assistant

## 2016-05-26 VITALS — BP 122/72 | HR 86 | Temp 98.3°F | Resp 17 | Ht 64.5 in | Wt 157.0 lb

## 2016-05-26 DIAGNOSIS — K5900 Constipation, unspecified: Secondary | ICD-10-CM

## 2016-05-26 DIAGNOSIS — R1011 Right upper quadrant pain: Secondary | ICD-10-CM

## 2016-05-26 DIAGNOSIS — R350 Frequency of micturition: Secondary | ICD-10-CM

## 2016-05-26 LAB — POCT CBC
Granulocyte percent: 61.4 %G (ref 37–80)
HCT, POC: 41.6 % (ref 37.7–47.9)
Hemoglobin: 14 g/dL (ref 12.2–16.2)
Lymph, poc: 3.2 (ref 0.6–3.4)
MCH, POC: 29 pg (ref 27–31.2)
MCHC: 33.8 g/dL (ref 31.8–35.4)
MCV: 85.8 fL (ref 80–97)
MID (cbc): 0.5 (ref 0–0.9)
MPV: 7.9 fL (ref 0–99.8)
POC Granulocyte: 5.9 (ref 2–6.9)
POC LYMPH PERCENT: 33 %L (ref 10–50)
POC MID %: 5.6 %M (ref 0–12)
Platelet Count, POC: 304 10*3/uL (ref 142–424)
RBC: 4.84 M/uL (ref 4.04–5.48)
RDW, POC: 13.6 %
WBC: 9.6 10*3/uL (ref 4.6–10.2)

## 2016-05-26 LAB — POC MICROSCOPIC URINALYSIS (UMFC): Mucus: ABSENT

## 2016-05-26 LAB — POCT URINALYSIS DIP (MANUAL ENTRY)
Bilirubin, UA: NEGATIVE
Blood, UA: NEGATIVE
Glucose, UA: NEGATIVE
Ketones, POC UA: NEGATIVE
Leukocytes, UA: NEGATIVE
Nitrite, UA: NEGATIVE
Protein Ur, POC: NEGATIVE
Spec Grav, UA: 1.02
Urobilinogen, UA: 0.2
pH, UA: 6

## 2016-05-26 MED ORDER — POLYETHYLENE GLYCOL 3350 17 GM/SCOOP PO POWD: 17.0000 g | Freq: Two times a day (BID) | ORAL | 1 refills | Status: DC | PRN

## 2016-05-26 MED ORDER — DOCUSATE SODIUM 100 MG PO CAPS: 100.0000 mg | ORAL_CAPSULE | Freq: Two times a day (BID) | ORAL | 0 refills | Status: DC

## 2016-05-26 NOTE — Progress Notes (Signed)
Melissa Harmon  MRN: [id number] DOB: 1955/10/12  PCP: PROVIDER NOT IN SYSTEM  Subjective:  Pt is a 61 year old female PMH GERD, hypothyroidism, diabetes, fibromyalgia, HLD, who presents to clinic for stomach pain x 4 days.  Located on upper right side. Pain with sitting, moving and breathing deeply. Describes it as "sore" 8/10 pain.  No MOI. She has tried ibuprofen, not helping.  + Increased frequency and urgency.  Denies burning with urination, fever, chills, nausea, back pain, pelvic pain.   Review of Systems  Constitutional: Negative for chills, fatigue and fever.  Respiratory: Negative for cough, shortness of breath and wheezing.   Cardiovascular: Negative for chest pain and palpitations.  Gastrointestinal: Positive for abdominal pain, constipation and nausea. Negative for diarrhea and vomiting.  Genitourinary: Positive for frequency and urgency. Negative for decreased urine volume, difficulty urinating, dysuria, enuresis, flank pain and hematuria.  Musculoskeletal: Negative for back pain.  Neurological: Negative for dizziness, weakness, light-headedness and headaches.  Psychiatric/Behavioral: Positive for sleep disturbance.    Patient Active Problem List   Diagnosis Date Noted  . DYSURIA 10/25/2009  . ABDOMINAL PAIN, RIGHT UPPER QUADRANT 10/25/2009  . NECK PAIN, LEFT 08/23/2009  . HAND PAIN, RIGHT 08/23/2009  . ELBOW PAIN, BILATERAL 05/17/2009  . TOBACCO USER 05/13/2009  . DIABETES MELLITUS, TYPE II, WITHOUT COMPLICATIONS 70/17/7939  . DYSLIPIDEMIA 12/14/2008  . SHORTNESS OF BREATH 12/14/2008  . RECTAL BLEEDING 04/22/2007  . COLONIC POLYPS, HX OF 04/22/2007  . BLOOD IN STOOL 04/15/2007  . PITYRIASIS ROSEA 08/09/2006  . HYPOTHYROIDISM, UNSPECIFIED 07/08/2006  . GASTROESOPHAGEAL REFLUX, NO ESOPHAGITIS 07/08/2006  . FIBROMYALGIA, FIBROMYOSITIS 07/08/2006    Current Outpatient Prescriptions on File Prior to Visit  Medication Sig Dispense Refill  . albuterol (PROVENTIL  HFA;VENTOLIN HFA) 108 (90 BASE) MCG/ACT inhaler Inhale 2 puffs into the lungs every 6 (six) hours as needed for wheezing.    Marland Kitchen atorvastatin (LIPITOR) 20 MG tablet Take 20 mg by mouth daily.    Marland Kitchen levothyroxine (SYNTHROID, LEVOTHROID) 175 MCG tablet Take 175 mcg by mouth daily before breakfast.    . metFORMIN (GLUCOPHAGE) 500 MG tablet Take 750 mg by mouth 2 (two) times daily with a meal.     . pantoprazole (PROTONIX) 40 MG tablet Take 1 tablet (40 mg total) by mouth 2 (two) times daily. 60 tablet 0  . gabapentin (NEURONTIN) 100 MG capsule Take 100 mg by mouth at bedtime.    . senna (SENOKOT) 8.6 MG TABS tablet Take 1 tablet by mouth daily as needed for mild constipation.    . [DISCONTINUED] omeprazole (PRILOSEC) 20 MG capsule Take 1 capsule (20 mg total) by mouth daily. (Patient not taking: Reported on 03/13/2015) 28 capsule 1   No current facility-administered medications on file prior to visit.     Allergies  Allergen Reactions  . Sulfamethoxazole Hives     Objective:  BP 122/72 (BP Location: Right Arm, Patient Position: Sitting, Cuff Size: Normal)   Pulse 86   Temp 98.3 F (36.8 C) (Oral)   Resp 17   Ht 5' 4.5" (1.638 m)   Wt 157 lb (71.2 kg)   SpO2 97%   BMI 26.53 kg/m   Physical Exam  Constitutional: She is oriented to person, place, and time and well-developed, well-nourished, and in no distress. No distress.  Cardiovascular: Normal rate, regular rhythm and normal heart sounds.   Pulmonary/Chest: Effort normal. No respiratory distress.  Abdominal: Soft. Normal appearance and bowel sounds are normal. She exhibits no shifting dullness,  no pulsatile liver and no mass. There is tenderness in the right upper quadrant and epigastric area. There is no rigidity, no rebound, no guarding, no CVA tenderness, no tenderness at McBurney's point and negative Murphy's sign.  Neurological: She is alert and oriented to person, place, and time. GCS score is 15.  Skin: Skin is warm and dry.    Psychiatric: Mood, memory, affect and judgment normal.  Vitals reviewed.  Results for orders placed or performed in visit on 05/26/16  POCT urinalysis dipstick  Result Value Ref Range   Color, UA yellow yellow   Clarity, UA clear clear   Glucose, UA negative negative   Bilirubin, UA negative negative   Ketones, POC UA negative negative   Spec Grav, UA 1.020    Blood, UA negative negative   pH, UA 6.0    Protein Ur, POC negative negative   Urobilinogen, UA 0.2    Nitrite, UA Negative Negative   Leukocytes, UA Negative Negative  POCT Microscopic Urinalysis ([facility])  Result Value Ref Range   WBC,UR,HPF,POC Few (A) None WBC/hpf   RBC,UR,HPF,POC None None RBC/hpf   Bacteria None None, Too numerous to count   Mucus Absent Absent   Epithelial Cells, UR Per Microscopy Moderate (A) None, Too numerous to count cells/hpf  POCT CBC  Result Value Ref Range   WBC 9.6 4.6 - 10.2 K/uL   Lymph, poc 3.2 0.6 - 3.4   POC LYMPH PERCENT 33.0 10 - 50 %L   MID (cbc) 0.5 0 - 0.9   POC MID % 5.6 0 - 12 %M   POC Granulocyte 5.9 2 - 6.9   Granulocyte percent 61.4 37 - 80 %G   RBC 4.84 4.04 - 5.48 M/uL   Hemoglobin 14.0 12.2 - 16.2 g/dL   HCT, POC 41.6 37.7 - 47.9 %   MCV 85.8 80 - 97 fL   MCH, POC 29.0 27 - 31.2 pg   MCHC 33.8 31.8 - 35.4 g/dL   RDW, POC 13.6 %   Platelet Count, POC 304 142 - 424 K/uL   MPV 7.9 0 - 99.8 fL    Dg Abd 2 Views  Result Date: 05/26/2016 CLINICAL DATA:  Right upper quadrant abdominal pain for 1 week. EXAM: ABDOMEN - 2 VIEW COMPARISON:  04/14/2013 FINDINGS: Prominent volume of formed stool from the splenic flexure to the sigmoid. Overall nonobstructive bowel gas pattern. No concerning mass effect or gas collection. Lung bases are clear. Mild thoracolumbar S-shaped scoliosis. IMPRESSION: Prominent volume of formed stool in the left colon. Negative for bowel obstruction or rectal impaction. Electronically Signed   By: Monte Fantasia M.D.   On: 05/26/2016 09:47     Assessment and Plan :  1. Constipation, unspecified constipation type 2. Right upper quadrant abdominal pain - polyethylene glycol powder (GLYCOLAX/MIRALAX) powder; Take 17 g by mouth 2 (two) times daily as needed.  Dispense: 578 g; Refill: 1 - docusate sodium (COLACE) 100 MG capsule; Take 1 capsule (100 mg total) by mouth 2 (two) times daily.  Dispense: 10 capsule; Refill: 0 - DG Abd 2 Views; Future - POCT CBC - CMP14+EGFR - X-ray shows prominent volume of formed stool. Will treat for constipation with colace + miralax. Supportive care discussed: Change diet to include leafy greens, fruit, more water and less soda, start exercising on a regular basis, fiber supplement. Dietary guidelines printed out for pt. RTC if symptoms fail to improve.   3. Urinary frequency - POCT urinalysis dipstick - POCT Microscopic Urinalysis (  [facility]) - Urine culture - Labs are reassuring. No sign of urinary tract infection. Culture pending.   Mercer Pod, PA-C  [facility] 05/26/2016 8:57 AM

## 2016-05-26 NOTE — Patient Instructions (Addendum)
Your x-ray shows that you are very constipated. This is more than likely causing your abdominal pain. Please see the below instructions to help you relieve your symptoms.  After you have done this, please take a daily fiber supplement. Also, see instructions at the bottom of this page for dietary modifications.  Thank you for coming in today. I hope you feel we met your needs.  Feel free to call [facility] if you have any questions or further requests.  Please consider signing up for MyChart if you do not already have it, as this is a great way to communicate with me.  Best,  Whitney McVey, PA-C   For constipation   Make sure you are drinking enough water daily. Make sure you are getting enough fiber in your diet - this will make you regular - you can eat high fiber foods or use metamucil as a supplement - it is really important to drink enough water when using fiber supplements.  If your stools are hard or are formed balls or you have to strain a stool softener will help - use colace 2-3 capsule a day  For gentle treatment of constipation Use Miralax 1-2 capfuls a day until your stools are soft and regular and then decrease the usage - you can use this daily  For more aggressive treatment of constipation Use 4 capfuls of Colace and 6 doses of Miralax and drink it in 2 hours - this should result in several watery stools - if it does not repeat the next day and then go to daily miralax for a week to make sure your bowels are clean and retrained to work properly  For the most aggressive treatment of constipation Use 14 capfuls of Miralax in 1 gallon of fluid (gatoraid or water work well or a combination of the two) and drink over 12h - it is ok to eat during this time and then use Miralax 1 capful daily for about 2 weeks to prevent the constipation from returning    Constipation, Adult Constipation is when a person has fewer bowel movements in a week than normal, has difficulty having a bowel  movement, or has stools that are dry, hard, or larger than normal. Constipation may be caused by an underlying condition. It may become worse with age if a person takes certain medicines and does not take in enough fluids. Follow these instructions at home: Eating and drinking  Eat foods that have a lot of fiber, such as fresh fruits and vegetables, whole grains, and beans.  Limit foods that are high in fat, low in fiber, or overly processed, such as french fries, hamburgers, cookies, candies, and soda.  Drink enough fluid to keep your urine clear or pale yellow. General instructions  Exercise regularly or as told by your health care provider.  Go to the restroom when you have the urge to go. Do not hold it in.  Take over-the-counter and prescription medicines only as told by your health care provider. These include any fiber supplements.  Practice pelvic floor retraining exercises, such as deep breathing while relaxing the lower abdomen and pelvic floor relaxation during bowel movements.  Watch your condition for any changes.  Keep all follow-up visits as told by your health care provider. This is important. Contact a health care provider if:  You have pain that gets worse.  You have a fever.  You do not have a bowel movement after 4 days.  You vomit.  You are not hungry.  You lose weight.  You are bleeding from the anus.  You have thin, pencil-like stools. Get help right away if:  You have a fever and your symptoms suddenly get worse.  You leak stool or have blood in your stool.  Your abdomen is bloated.  You have severe pain in your abdomen.  You feel dizzy or you faint. This information is not intended to replace advice given to you by your health care provider. Make sure you discuss any questions you have with your health care provider. Document Released: 01/24/2004 Document Revised: 11/15/2015 Document Reviewed: 10/16/2015 Elsevier Interactive Patient  Education  2017 [employer].  IF you received an x-ray today, you will receive an invoice from [facility]. Please contact [facility] at [phone] with questions or concerns regarding your invoice.   IF you received labwork today, you will receive an invoice from [facility]. Please contact [facility] at [phone] with questions or concerns regarding your invoice.   Our billing staff will not be able to assist you with questions regarding bills from these companies.  You will be contacted with the lab results as soon as they are available. The fastest way to get your results is to activate your My Chart account. Instructions are located on the last page of this paperwork. If you have not heard from [place] regarding the results in 2 weeks, please contact this office.    We recommend that you schedule a mammogram for breast cancer screening. Typically, you do not need a referral to do this. Please contact a local imaging center to schedule your mammogram.  [facility] - [phone]  *ask for the Radiology Department [facility] - [phone] or [phone]  [facility] - [phone] [place] [phone] [facility] - [phone]  *ask for the [facility] - [phone]  *ask for the Radiology Department [facility] - [phone]  *ask for the [facility] - [phone]

## 2016-05-27 LAB — CMP14+EGFR
ALT: 29 [IU]/L (ref 0–32)
AST: 19 [IU]/L (ref 0–40)
Albumin/Globulin Ratio: 1.6 (ref 1.2–2.2)
Albumin: 4.5 g/dL (ref 3.5–5.5)
Alkaline Phosphatase: 77 [IU]/L (ref 39–117)
BUN/Creatinine Ratio: 21 (ref 9–23)
BUN: 16 mg/dL (ref 6–24)
Bilirubin Total: 0.3 mg/dL (ref 0.0–1.2)
CO2: 24 mmol/L (ref 18–29)
Calcium: 9.3 mg/dL (ref 8.7–10.2)
Chloride: 99 mmol/L (ref 96–106)
Creatinine, Ser: 0.77 mg/dL (ref 0.57–1.00)
GFR calc Af Amer: 98 mL/min/{1.73_m2}
GFR calc non Af Amer: 85 mL/min/{1.73_m2}
Globulin, Total: 2.9 g/dL (ref 1.5–4.5)
Glucose: 133 mg/dL — ABNORMAL HIGH (ref 65–99)
Potassium: 4.7 mmol/L (ref 3.5–5.2)
Sodium: 142 mmol/L (ref 134–144)
Total Protein: 7.4 g/dL (ref 6.0–8.5)

## 2016-05-28 LAB — URINE CULTURE: Organism ID, Bacteria: NO GROWTH

## 2016-06-03 ENCOUNTER — Encounter: Payer: Self-pay | Admitting: Physician Assistant

## 2016-10-20 ENCOUNTER — Ambulatory Visit
Admission: RE | Admit: 2016-10-20 | Discharge: 2016-10-20 | Disposition: A | Discharge status: Home / Self Care | Payer: Commercial Managed Care - PPO | Source: Ambulatory Visit | Attending: Internal Medicine | Admitting: Internal Medicine

## 2016-10-20 ENCOUNTER — Other Ambulatory Visit: Payer: Self-pay | Admitting: Internal Medicine

## 2016-10-20 DIAGNOSIS — M5410 Radiculopathy, site unspecified: Secondary | ICD-10-CM

## 2016-11-03 ENCOUNTER — Other Ambulatory Visit (HOSPITAL_COMMUNITY)
Admission: RE | Admit: 2016-11-03 | Discharge: 2016-11-03 | Disposition: A | Discharge status: Home / Self Care | Payer: Commercial Managed Care - PPO | Source: Ambulatory Visit | Attending: Internal Medicine | Admitting: Internal Medicine

## 2016-11-03 ENCOUNTER — Other Ambulatory Visit: Payer: Self-pay | Admitting: Internal Medicine

## 2016-11-03 DIAGNOSIS — Z01419 Encounter for gynecological examination (general) (routine) without abnormal findings: Secondary | ICD-10-CM | POA: Diagnosis present

## 2016-11-05 LAB — CYTOLOGY - PAP: Diagnosis: NEGATIVE

## 2016-12-28 ENCOUNTER — Ambulatory Visit (INDEPENDENT_AMBULATORY_CARE_PROVIDER_SITE_OTHER): Payer: Commercial Managed Care - PPO | Admitting: Orthopaedic Surgery

## 2016-12-28 ENCOUNTER — Encounter (INDEPENDENT_AMBULATORY_CARE_PROVIDER_SITE_OTHER): Payer: Self-pay | Admitting: Orthopaedic Surgery

## 2016-12-28 DIAGNOSIS — M65331 Trigger finger, right middle finger: Secondary | ICD-10-CM

## 2016-12-28 DIAGNOSIS — M65351 Trigger finger, right little finger: Secondary | ICD-10-CM | POA: Diagnosis not present

## 2016-12-28 MED ORDER — MELOXICAM 7.5 MG PO TABS: 7.5000 mg | ORAL_TABLET | Freq: Two times a day (BID) | ORAL | 2 refills | Status: DC | PRN

## 2016-12-28 MED ORDER — PREDNISONE 10 MG (21) PO TBPK: ORAL_TABLET | ORAL | 0 refills | Status: DC

## 2016-12-28 NOTE — Progress Notes (Signed)
Office Visit Note   Patient: Melissa Harmon           Date of Birth: 1955/09/15           MRN: [id number] Visit Date: 12/28/2016              Requested by: No referring provider defined for this encounter. PCP: System, Provider Not In   Assessment & Plan: Visit Diagnoses:  1. Trigger finger, right middle finger   2. Trigger little finger of right hand     Plan: Overall impression is right middle and little trigger fingers with concomitant mild osteoarthritis. Patient has failed to injections in the past. I do not recommend a third one. Prednisone taper and meloxicam was prescribed. Questions encouraged and answered. Follow-up as needed.  Follow-Up Instructions: Return if symptoms worsen or fail to improve.   Orders:  No orders of the defined types were placed in this encounter.  Meds ordered this encounter  Medications  . predniSONE (STERAPRED UNI-PAK 21 TAB) 10 MG (21) TBPK tablet    Sig: Take as directed    Dispense:  21 tablet    Refill:  0  . meloxicam (MOBIC) 7.5 MG tablet    Sig: Take 1 tablet (7.5 mg total) by mouth 2 (two) times daily as needed for pain.    Dispense:  30 tablet    Refill:  2      Procedures: No procedures performed   Clinical Data: No additional findings.   Subjective: Chief Complaint  Patient presents with  . Right Hand - Pain    She comes in with right middle and little finger short drain with generalized right hand pain and stiffness worse in the morning. This is been going on for years. She's had 2 previous trigger finger injections into the middle finger with 2 months of complete relief. She is right-hand dominant. Denies any history of rheumatoid arthritis or numbness and tingling.    Review of Systems  Constitutional: Negative.   HENT: Negative.   Eyes: Negative.   Respiratory: Negative.   Cardiovascular: Negative.   Endocrine: Negative.   Musculoskeletal: Negative.   Neurological: Negative.   Hematological: Negative.     Psychiatric/Behavioral: Negative.   All other systems reviewed and are negative.    Objective: Vital Signs: There were no vitals taken for this visit.  Physical Exam  Constitutional: She is oriented to person, place, and time. She appears well-developed and well-nourished.  HENT:  Head: Normocephalic and atraumatic.  Eyes: EOM are normal.  Neck: Neck supple.  Pulmonary/Chest: Effort normal.  Abdominal: Soft.  Neurological: She is alert and oriented to person, place, and time.  Skin: Skin is warm. Capillary refill takes less than 2 seconds.  Psychiatric: She has a normal mood and affect. Her behavior is normal. Judgment and thought content normal.  Nursing note and vitals reviewed.   Ortho Exam Right hand exam shows no significant swelling or skin lesions. She has tenderness near the metacarpal head of the third and fifth digits. There is no active triggering. She has no nodules. Specialty Comments:  No specialty comments available.  Imaging: No results found.   PMFS History: Patient Active Problem List   Diagnosis Date Noted  . DYSURIA 10/25/2009  . ABDOMINAL PAIN, RIGHT UPPER QUADRANT 10/25/2009  . NECK PAIN, LEFT 08/23/2009  . HAND PAIN, RIGHT 08/23/2009  . ELBOW PAIN, BILATERAL 05/17/2009  . TOBACCO USER 05/13/2009  . DIABETES MELLITUS, TYPE II, WITHOUT COMPLICATIONS 93/81/0175  .  DYSLIPIDEMIA 12/14/2008  . SHORTNESS OF BREATH 12/14/2008  . RECTAL BLEEDING 04/22/2007  . COLONIC POLYPS, HX OF 04/22/2007  . BLOOD IN STOOL 04/15/2007  . PITYRIASIS ROSEA 08/09/2006  . HYPOTHYROIDISM, UNSPECIFIED 07/08/2006  . GASTROESOPHAGEAL REFLUX, NO ESOPHAGITIS 07/08/2006  . FIBROMYALGIA, FIBROMYOSITIS 07/08/2006   Past Medical History:  Diagnosis Date  . Asthma   . Carpal tunnel syndrome   . Chronic neck pain   . Diabetes mellitus   . Fibromyalgia   . History of shingles    last 2013  . Hypercholesteremia   . Thyroid disease     Family History  Problem Relation  Age of Onset  . Diabetes Mother   . Heart disease Mother   . Stroke Mother     Past Surgical History:  Procedure Laterality Date  . CESAREAN SECTION    . THYROIDECTOMY     Social History   Occupational History  . Not on file.   Social History Main Topics  . Smoking status: Current Every Day Smoker    Packs/day: 0.25    Years: 25.00    Types: Cigarettes  . Smokeless tobacco: Never Used  . Alcohol use 0.6 oz/week    1 Cans of beer per week     Comment: occasional  . Drug use: No  . Sexual activity: Not on file

## 2017-05-26 DIAGNOSIS — J019 Acute sinusitis, unspecified: Secondary | ICD-10-CM | POA: Diagnosis not present

## 2017-05-26 DIAGNOSIS — J209 Acute bronchitis, unspecified: Secondary | ICD-10-CM | POA: Diagnosis not present

## 2017-05-28 DIAGNOSIS — H524 Presbyopia: Secondary | ICD-10-CM | POA: Diagnosis not present

## 2017-05-28 DIAGNOSIS — E119 Type 2 diabetes mellitus without complications: Secondary | ICD-10-CM | POA: Diagnosis not present

## 2017-07-07 DIAGNOSIS — Z7984 Long term (current) use of oral hypoglycemic drugs: Secondary | ICD-10-CM | POA: Diagnosis not present

## 2017-07-07 DIAGNOSIS — E1140 Type 2 diabetes mellitus with diabetic neuropathy, unspecified: Secondary | ICD-10-CM | POA: Diagnosis not present

## 2017-09-13 DIAGNOSIS — E039 Hypothyroidism, unspecified: Secondary | ICD-10-CM | POA: Diagnosis not present

## 2017-09-13 DIAGNOSIS — E1165 Type 2 diabetes mellitus with hyperglycemia: Secondary | ICD-10-CM | POA: Diagnosis not present

## 2017-09-18 ENCOUNTER — Encounter (HOSPITAL_COMMUNITY): Payer: Self-pay | Admitting: Psychiatry

## 2017-09-18 ENCOUNTER — Ambulatory Visit (INDEPENDENT_AMBULATORY_CARE_PROVIDER_SITE_OTHER): Payer: Commercial Managed Care - PPO | Admitting: Psychiatry

## 2017-09-18 VITALS — BP 112/68 | HR 88 | Ht 64.5 in | Wt 156.0 lb

## 2017-09-18 DIAGNOSIS — F5105 Insomnia due to other mental disorder: Secondary | ICD-10-CM | POA: Insufficient documentation

## 2017-09-18 DIAGNOSIS — F331 Major depressive disorder, recurrent, moderate: Secondary | ICD-10-CM | POA: Insufficient documentation

## 2017-09-18 DIAGNOSIS — F4322 Adjustment disorder with anxiety: Secondary | ICD-10-CM

## 2017-09-18 DIAGNOSIS — Z818 Family history of other mental and behavioral disorders: Secondary | ICD-10-CM

## 2017-09-18 DIAGNOSIS — F17210 Nicotine dependence, cigarettes, uncomplicated: Secondary | ICD-10-CM | POA: Diagnosis not present

## 2017-09-18 DIAGNOSIS — F99 Mental disorder, not otherwise specified: Secondary | ICD-10-CM | POA: Diagnosis not present

## 2017-09-18 DIAGNOSIS — F4010 Social phobia, unspecified: Secondary | ICD-10-CM | POA: Diagnosis not present

## 2017-09-18 DIAGNOSIS — F429 Obsessive-compulsive disorder, unspecified: Secondary | ICD-10-CM | POA: Diagnosis not present

## 2017-09-18 DIAGNOSIS — F419 Anxiety disorder, unspecified: Secondary | ICD-10-CM

## 2017-09-18 MED ORDER — MIRTAZAPINE 15 MG PO TABS: 15.0000 mg | ORAL_TABLET | Freq: Every day | ORAL | 1 refills | Status: DC

## 2017-09-18 NOTE — Progress Notes (Signed)
Psychiatric Initial Adult Assessment   Patient Identification: Melissa Harmon MRN:  [id number] Date of Evaluation:  09/18/2017 Referral Source: self Chief Complaint:   Chief Complaint    Establish Care; Depression; Anxiety     Visit Diagnosis:    ICD-10-CM   1. Adjustment disorder with anxious mood F43.22 mirtazapine (REMERON) 15 MG tablet  2. MDD (major depressive disorder), recurrent episode, moderate (HCC) F33.1 mirtazapine (REMERON) 15 MG tablet  3. Insomnia due to other mental disorder F51.05 mirtazapine (REMERON) 15 MG tablet   F99     History of Present Illness:  Here to establish care for ongoing depression and anxiety symptoms. Pt states she is very anxious all the time. It started about 2 weeks ago for no reason. She is agitated and irritable. Pt feels everyone is "knit picking me". At work 2 weeks ago she made a mistake at work and was yelled at by her Freight forwarder. She feels the manager has not let it go and is watching her for mistakes now. Since then pt has not been able to sleep and is feeling very tired. Pt has poor appetite. Prior to 2 weeks ago she was only having occasional anxiety, depression and insomnia. Pt is afraid and then feels like some one is in the house with her and then she can't sleep. She sometimes feels someone calling her name or the front door opening. When pressed she states it happens about 1-2x/month. Pt states she is depressed most days of the week. She wants to be left alone at work and is often irritable after work. Pt reports low motivation and anhedonia about one month ago. She has random crying spells. Pt often feels sorry for herself because of her chronic pain. Pt denies SI/HI. She states she has passive thoughts of death when her pain is excessive. She states the last time was 1 month ago. She was on Zoloft many years ago. It made her life a "zombie" so she didn't like it.  Associated Signs/Symptoms: Depression Symptoms:  depressed  mood, anhedonia, insomnia, fatigue, feelings of worthlessness/guilt, loss of energy/fatigue, disturbed sleep, weight loss, decreased appetite,   (Hypo) Manic Symptoms:  negative   Anxiety Symptoms:  Excessive Worry, denies panic attacks, OCD, social anxiety   Psychotic Symptoms:  negative   PTSD Symptoms: Negative  Past Psychiatric History:  Dx: Bipolar- a long time ago but might be related to her thyroid Meds: Zoloft Previous psychiatrist/therapist: has seen at least 3 different psych many year ago Other treatments: denies Hospitalizations: denies SIB: denies Suicide attempts: denies Hx of violent behavior towards others: denies Current access to guns: denies Hx of abuse: denies Military Hx: denies Hx of Seizures: denies Hx of TBI: denies   Substance Abuse History in the last 12 months:  No.  Consequences of Substance Abuse: Negative  Past Medical History:  Past Medical History:  Diagnosis Date  . Asthma   . Carpal tunnel syndrome   . Chronic neck pain   . Diabetes mellitus   . Fibromyalgia   . History of shingles    last 2013  . Hypercholesteremia   . Thyroid disease     Past Surgical History:  Procedure Laterality Date  . CESAREAN SECTION    . THYROIDECTOMY      Family Psychiatric History: Family History  Problem Relation Age of Onset  . Diabetes Mother   . Heart disease Mother   . Stroke Mother   . Bipolar disorder Father   . Schizophrenia Brother  Social History:   Social History   Socioeconomic History  . Marital status: Single    Spouse name: Not on file  . Number of children: Not on file  . Years of education: Not on file  . Highest education level: Not on file  Occupational History  . Not on file  Social Needs  . Financial resource strain: Not on file  . Food insecurity:    Worry: Not on file    Inability: Not on file  . Transportation needs:    Medical: Not on file    Non-medical: Not on file  Tobacco Use  . Smoking  status: Current Every Day Smoker    Packs/day: 0.25    Years: 25.00    Pack years: 6.25    Types: Cigarettes  . Smokeless tobacco: Never Used  Substance and Sexual Activity  . Alcohol use: Yes    Alcohol/week: 1.8 oz    Types: 3 Cans of beer per week    Comment: occasional- 2x/month  . Drug use: No  . Sexual activity: Not on file  Lifestyle  . Physical activity:    Days per week: Not on file    Minutes per session: Not on file  . Stress: Not on file  Relationships  . Social connections:    Talks on phone: Not on file    Gets together: Not on file    Attends religious service: Not on file    Active member of club or organization: Not on file    Attends meetings of clubs or organizations: Not on file    Relationship status: Not on file  Other Topics Concern  . 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 Sulfamethoxazole Hives    Metabolic Disorder Labs: Lab Results  Component Value Date   HGBA1C 6.9 09/28/2013   No results found for: PROLACTIN Lab Results  Component Value Date   CHOL 257 (H) 05/20/2009   TRIG 127 05/20/2009   HDL 47 05/20/2009   CHOLHDL 5.5 Ratio 05/20/2009   VLDL 25 05/20/2009   LDLCALC 185 (H) 05/20/2009   LDLCALC 176 (H) 10/26/2008     Current Medications: Current Outpatient Medications  Medication Sig Dispense Refill  . albuterol (PROVENTIL HFA;VENTOLIN HFA) 108 (90 BASE) MCG/ACT inhaler Inhale 2 puffs into the lungs every 6 (six) hours as needed for wheezing.    Marland Kitchen atorvastatin (LIPITOR) 20 MG tablet Take 20 mg by mouth daily.     Marland Kitchen docusate sodium (COLACE) 100 MG capsule Take 1 capsule (100 mg total) by mouth 2 (two) times daily. 10 capsule 0  . gabapentin (NEURONTIN) 100 MG capsule Take 100 mg by mouth at bedtime.    Marland Kitchen levothyroxine (SYNTHROID, LEVOTHROID) 175 MCG tablet Take 175 mcg by mouth daily before breakfast.    . meloxicam (MOBIC) 7.5 MG tablet Take 1 tablet (7.5 mg total) by mouth 2 (two) times daily as needed for pain. 30 tablet 2  . metFORMIN (GLUCOPHAGE) 500 MG tablet Take  750 mg by mouth 2 (two) times daily with a meal.     . pantoprazole (PROTONIX) 40 MG tablet Take 1 tablet (40 mg total) by mouth 2 (two) times daily. 60 tablet 0  . polyethylene glycol powder (GLYCOLAX/MIRALAX) powder Take 17 g by mouth 2 (two) times daily as needed. 578 g 1  . senna (SENOKOT) 8.6 MG TABS tablet Take 1 tablet by mouth daily as needed for mild constipation.    . sitaGLIPtin (JANUVIA) 100 MG tablet Take 100 mg by mouth daily.    . mirtazapine (REMERON) 15 MG tablet Take 1 tablet (15 mg total) by mouth at bedtime. 30 tablet 1   No current facility-administered medications for this visit.       Musculoskeletal: Strength & Muscle Tone: within normal limits Gait & Station: normal Patient leans: N/A  Psychiatric Specialty Exam: Review of Systems  Gastrointestinal: Positive for abdominal pain, diarrhea, heartburn and nausea. Negative for vomiting.  Musculoskeletal: Positive for joint pain and neck pain. Negative for back pain.  Neurological: Positive for dizziness, tingling and sensory change. Negative for headaches.    Blood pressure 112/68, pulse 88, height 5' 4.5" (1.638 m), weight 156 lb (70.8 kg).Body mass index is 26.36 kg/m.  General Appearance: Casual  Eye Contact:  Good  Speech:  Clear and Coherent and Normal Rate  Volume:  Normal  Mood:  Anxious and Depressed  Affect:  Congruent  Thought Process:  Goal Directed and Descriptions of Associations: Intact  Orientation:  Full (Time, Place, and Person)   Thought Content:  Paranoid Ideation  Suicidal Thoughts:  No  Homicidal Thoughts:  No  Memory:  Immediate;   Good Recent;   Good Remote;   Good  Judgement:  Fair  Insight:  Fair  Psychomotor Activity:  Normal  Concentration:  Concentration: Fair and Attention Span: Fair  Recall:  [employer] of Knowledge:Fair  Language: Fair  Akathisia:  No  Handed:  Right  AIMS (if indicated):  n/a  Assets:  Communication Skills Desire for Improvement Housing Transportation Vocational/Educational  ADL's:  Intact  Cognition: WNL  Sleep:  poor    Treatment Plan Summary: Medication management   Assessment and Plan: MDD-recurrent, moderate; Adjustment disorder with anxious mood; Insomnia  Confidentiality and exclusions reviewed with pt who verbalized understanding.   Medication management with supportive therapy. Risks and benefits, side effects and alternative treatment options discussed with patient. Pt was given an opportunity to ask questions about medication, illness, and treatment. All current psychiatric medications have been reviewed and discussed with the patient and adjusted as clinically appropriate. The patient has been provided an accurate and updated list of the medications being now prescribed. Patient expressed understanding of how their medications were to be used.  Pt verbalized understanding and verbal consent obtained for treatment.  The risk of un-intended pregnancy is low based on the fact that pt reports she is post menopausal. Pt is aware that these meds carry a teratogenic risk. Pt will discuss plan of action if she does or plans to become pregnant in the future.  Meds: start trial of Remeron 15mg  po qHS for MDD and anxiety. Pt is working with PCP about DM so will monitor   Labs: none  Therapy: brief supportive therapy provided. Discussed psychosocial stressors in detail.   Consultations: Encouraged to follow up with PCP as needed  Pt denies SI and is at an acute  low risk for suicide. Patient told to call clinic if any problems occur. Patient advised  to go to ER if they should develop SI/HI, side effects, or if symptoms worsen. Has crisis numbers to call if needed. Pt verbalized understanding.  F/up in 1 months or sooner if needed    Charlcie Cradle, MD 5/11/201911:26 AM

## 2017-09-20 DIAGNOSIS — Z1231 Encounter for screening mammogram for malignant neoplasm of breast: Secondary | ICD-10-CM | POA: Diagnosis not present

## 2017-09-23 ENCOUNTER — Encounter (HOSPITAL_COMMUNITY): Payer: Self-pay

## 2017-10-14 ENCOUNTER — Ambulatory Visit (HOSPITAL_COMMUNITY): Payer: Commercial Managed Care - PPO | Admitting: Psychiatry

## 2017-11-04 DIAGNOSIS — E1165 Type 2 diabetes mellitus with hyperglycemia: Secondary | ICD-10-CM | POA: Diagnosis not present

## 2017-11-04 DIAGNOSIS — Z0000 Encounter for general adult medical examination without abnormal findings: Secondary | ICD-10-CM | POA: Diagnosis not present

## 2017-11-04 DIAGNOSIS — E1140 Type 2 diabetes mellitus with diabetic neuropathy, unspecified: Secondary | ICD-10-CM | POA: Diagnosis not present

## 2017-11-04 DIAGNOSIS — K581 Irritable bowel syndrome with constipation: Secondary | ICD-10-CM | POA: Diagnosis not present

## 2017-11-04 DIAGNOSIS — E785 Hyperlipidemia, unspecified: Secondary | ICD-10-CM | POA: Diagnosis not present

## 2017-11-18 ENCOUNTER — Encounter (HOSPITAL_COMMUNITY): Payer: Self-pay | Admitting: Psychiatry

## 2017-11-18 ENCOUNTER — Ambulatory Visit (HOSPITAL_COMMUNITY): Payer: Commercial Managed Care - PPO | Admitting: Psychiatry

## 2017-11-18 DIAGNOSIS — F5105 Insomnia due to other mental disorder: Secondary | ICD-10-CM

## 2017-11-18 DIAGNOSIS — F4322 Adjustment disorder with anxiety: Secondary | ICD-10-CM

## 2017-11-18 DIAGNOSIS — F331 Major depressive disorder, recurrent, moderate: Secondary | ICD-10-CM | POA: Diagnosis not present

## 2017-11-18 DIAGNOSIS — F99 Mental disorder, not otherwise specified: Secondary | ICD-10-CM | POA: Diagnosis not present

## 2017-11-18 MED ORDER — DIVALPROEX SODIUM ER 500 MG PO TB24: 500.0000 mg | ORAL_TABLET | Freq: Every day | ORAL | 1 refills | Status: DC

## 2017-11-18 NOTE — Progress Notes (Signed)
BH MD/PA/NP OP Progress Note  11/18/2017 8:58 AM Melissa Harmon  MRN:  [id number]  Chief Complaint:  Chief Complaint    Anxiety; Depression; Follow-up     HPI: Pt reports she is sleeping better with Remeron. Pt is getting about 6 hrs/night. Her energy remains low. Depression is a little better but still a problem. She wants to go out a little more so her anhedonia is improving but remains isolated. Pt states it is "like I don't like anyone anymore and I used to be such a people person". Melissa Harmon is not crying as much. Pt has few passive thoughts of death but much less frequently than before. Pt denies SI/HI. Pt is worrying a little less and is sometimes able to distract herself. Pt remains irritable and does not want to be bothered by anyone. Melissa Harmon says nothing has changed at work and she is very aggravated.   Pt is concerned that she is misplacing items more than before.  Pt denies recent manic and hypomanic symptoms including periods of decreased need for sleep, increased energy, mood lability, impulsivity, FOI, and excessive spending.  Melissa Harmon says when alone she often hears someone calling her name.  Pt states-taking meds as prescribed and reports her PCP told her that her HbA1c is increased.   Visit Diagnosis:    ICD-10-CM   1. MDD (major depressive disorder), recurrent episode, moderate (HCC) F33.1 divalproex (DEPAKOTE ER) 500 MG 24 hr tablet  2. Adjustment disorder with anxious mood F43.22 divalproex (DEPAKOTE ER) 500 MG 24 hr tablet  3. Insomnia due to other mental disorder F51.05    F99       Past Psychiatric History:  Dx: Bipolar- a long time ago but might be related to her thyroid Meds: Zoloft Previous psychiatrist/therapist: has seen at least 3 different psych many year ago Other treatments: denies Hospitalizations: denies SIB: denies Suicide attempts: denies Hx of violent behavior towards others: denies Current access to guns: denies Hx of abuse: denies Military Hx:  denies Hx of Seizures: denies Hx of TBI: denies     Past Medical History:  Past Medical History:  Diagnosis Date  . Asthma   . Carpal tunnel syndrome   . Chronic neck pain   . Diabetes mellitus   . Fibromyalgia   . History of shingles    last 2013  . Hypercholesteremia   . Thyroid disease     Past Surgical History:  Procedure Laterality Date  . CESAREAN SECTION    . THYROIDECTOMY      Family Psychiatric History:  Family History  Problem Relation Age of Onset  . Diabetes Mother   . Heart disease Mother   . Stroke Mother   . Bipolar disorder Father   . Schizophrenia Brother     Social History:  Social History   Socioeconomic History  . Marital status: Single    Spouse name: Not on file  . Number of children: Not on file  . Years of education: Not on file  . Highest education level: Not on file  Occupational History  . Not on file  Social Needs  . Financial resource strain: Not on file  . Food insecurity:    Worry: Not on file    Inability: Not on file  . Transportation needs:    Medical: Not on file    Non-medical: Not on file  Tobacco Use  . Smoking status: Current Every Day Smoker    Packs/day: 0.25    Years: 25.00  Pack years: 6.25    Types: Cigarettes  . Smokeless tobacco: Never Used  Substance and Sexual Activity  . Alcohol use: Yes    Alcohol/week: 1.8 oz    Types: 3 Cans of beer per week    Comment: occasional- 2x/month  . Drug use: No  . Sexual activity: Not on file  Lifestyle  . Physical activity:    Days per week: Not on file    Minutes per session: Not on file  . Stress: Not on file  Relationships  . Social connections:    Talks on phone: Not on file    Gets together: Not on file    Attends religious service: Not on file    Active member of club or organization: Not on file    Attends meetings of clubs or organizations: Not on file    Relationship status: Not on file  Other Topics Concern  . 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 Sulfamethoxazole Hives    Metabolic Disorder Labs: Lab Results  Component Value Date   HGBA1C 6.9 09/28/2013   No results found for: PROLACTIN Lab Results  Component Value Date   CHOL 257 (H) 05/20/2009   TRIG 127 05/20/2009   HDL 47 05/20/2009   CHOLHDL 5.5 Ratio 05/20/2009   VLDL 25 05/20/2009   LDLCALC 185 (H) 05/20/2009   LDLCALC 176 (H) 10/26/2008   Lab Results  Component Value Date   TSH 6.235 (H) 10/25/2009   TSH 19.602 (H) 12/14/2008    Therapeutic Level Labs: No results found for: LITHIUM No results found for: VALPROATE No components found for:  CBMZ  Current Medications: Current Outpatient Medications  Medication Sig Dispense Refill  . albuterol (PROVENTIL HFA;VENTOLIN HFA) 108 (90 BASE) MCG/ACT inhaler Inhale 2 puffs into the lungs every 6 (six) hours as needed for wheezing.    Marland Kitchen atorvastatin (LIPITOR) 20 MG tablet Take 20 mg by mouth daily.    Marland Kitchen docusate sodium (COLACE) 100 MG capsule Take 1 capsule (100 mg total) by mouth 2 (two) times daily. 10 capsule 0  . gabapentin (NEURONTIN) 100 MG capsule Take 100 mg by mouth at bedtime.    Marland Kitchen levothyroxine (SYNTHROID, LEVOTHROID) 175 MCG tablet Take 175 mcg by mouth daily before breakfast.    . meloxicam (MOBIC) 7.5 MG tablet Take 1 tablet (7.5 mg total) by mouth 2 (two) times daily as needed for pain. 30 tablet 2  . metFORMIN (GLUCOPHAGE) 500 MG tablet Take 750 mg by mouth 2 (two) times daily with a meal.     . pantoprazole (PROTONIX) 40 MG tablet Take 1 tablet (40 mg total) by mouth 2  (two) times daily. 60 tablet 0  . polyethylene glycol powder (GLYCOLAX/MIRALAX) powder Take 17 g by mouth 2 (two) times daily as needed. 578 g 1  . senna (SENOKOT) 8.6 MG TABS tablet Take 1 tablet by mouth daily as needed for mild constipation.    . sitaGLIPtin (JANUVIA) 100 MG tablet Take 100 mg  by mouth daily.    . divalproex (DEPAKOTE ER) 500 MG 24 hr tablet Take 1 tablet (500 mg total) by mouth daily. 30 tablet 1   No current facility-administered medications for this visit.      Musculoskeletal: Strength & Muscle Tone: within normal limits Gait & Station: normal Patient leans: N/A  Psychiatric Specialty Exam: Review of Systems  Musculoskeletal: Positive for back pain, falls, joint pain and neck pain.  Neurological: Positive for tingling and sensory change. Negative for speech change.    Blood pressure 128/72, pulse 74, height 5' 2.75" (1.594 m), weight 156 lb (70.8 kg).Body mass index is 27.85 kg/m.  General Appearance: Casual  Eye Contact:  Good  Speech:  Clear and Coherent and Normal Rate  Volume:  Normal  Mood:  Depressed and Irritable  Affect:  Congruent  Thought Process:  Goal Directed and Descriptions of Associations: Intact  Orientation:  Full (Time, Place, and Person)  Thought Content: Logical   Suicidal Thoughts:  No  Homicidal Thoughts:  No  Memory:  Immediate;   Fair Recent;   Fair Remote;   Fair  Judgement:  Good  Insight:  Good  Psychomotor Activity:  Normal  Concentration:  Concentration: Good and Attention Span: Good  Recall:  [place] of Knowledge: Good  Language: Good  Akathisia:  No  Handed:  Right  AIMS (if indicated): not done  Assets:  Communication Skills Desire for Improvement Housing Transportation Vocational/Educational  ADL's:  Intact  Cognition: WNL  Sleep:  Fair   Screenings: PHQ2-9     Office Visit from 05/26/2016 in [facility] at [facility] Total Score  0       Assessment and Plan: MDD-recurrent, moderate; r/o  Bipolar d/o by hx Adjustment disorder with anxious mood; Insomnia   Medication management with supportive therapy. Risks and benefits, side effects and alternative treatment options discussed with patient. Pt was given an opportunity to ask questions about medication, illness, and treatment. All current psychiatric medications have been reviewed and discussed with the patient and adjusted as clinically appropriate. The patient has been provided an accurate and updated list of the medications being now prescribed. Patient expressed understanding of how their medications were to be used.  Pt verbalized understanding and verbal consent obtained for treatment.  The risk of un-intended pregnancy is low based on the fact that pt reports she is post menopausal. Pt is aware that these meds carry a teratogenic risk. Pt will discuss plan of action if she does or plans to become pregnant in the future.  Status of symptoms- ongoing  Meds: start trial of Depakote ER 500mg  po qHS for mood  D/c Remeron due to SE of increased sugars   Labs: none  Therapy: brief supportive therapy provided. Discussed psychosocial stressors in detail.   Consultations: Encouraged to follow up with PCP as needed  Pt denies SI and is at an acute low risk for suicide. Patient told to call clinic if any problems occur. Patient advised to go to ER if they should develop SI/HI, side effects, or if symptoms worsen. Has crisis numbers to call if needed. Pt verbalized understanding.  F/up in 8 weeks or sooner if needed- per pt preference due to copay   Charlcie Cradle, MD 11/18/2017, 8:58 AM

## 2017-11-29 ENCOUNTER — Encounter (HOSPITAL_COMMUNITY): Payer: Self-pay | Admitting: Psychiatry

## 2017-11-29 ENCOUNTER — Telehealth (HOSPITAL_COMMUNITY): Payer: Self-pay

## 2017-11-29 NOTE — Telephone Encounter (Addendum)
Patient called and said that on her appointment day on 11-18-17 she didn't return to work that day she said that she was to upset, crying a lot and  and she didn't know how she  Patient returned to work on 11-19-17, but [employer] on her job said that she will not be able to get paid for that day because she received a return to work note from [place] and it said that she could return on 11-18-17.  Patient said can she have a note stating that she was to return to work on 11-19-17.   Attention: Candace [phone] fax number.   Please advise

## 2017-12-02 NOTE — Telephone Encounter (Signed)
No. We did not discuss taking any additional time off of work

## 2018-01-20 ENCOUNTER — Ambulatory Visit (HOSPITAL_COMMUNITY): Payer: Self-pay | Admitting: Psychiatry

## 2018-05-18 ENCOUNTER — Telehealth: Payer: Self-pay | Admitting: Gastroenterology

## 2018-05-18 NOTE — Telephone Encounter (Signed)
A user error has taken place: ERROR °

## 2018-05-19 ENCOUNTER — Encounter: Payer: Self-pay | Admitting: Gastroenterology

## 2018-05-19 ENCOUNTER — Ambulatory Visit: Payer: Commercial Managed Care - PPO | Admitting: Gastroenterology

## 2018-05-19 ENCOUNTER — Other Ambulatory Visit: Payer: Commercial Managed Care - PPO

## 2018-05-19 VITALS — BP 120/70 | HR 79 | Ht 65.0 in | Wt 164.4 lb

## 2018-05-19 DIAGNOSIS — K5909 Other constipation: Secondary | ICD-10-CM

## 2018-05-19 DIAGNOSIS — K625 Hemorrhage of anus and rectum: Secondary | ICD-10-CM

## 2018-05-19 LAB — HEPATIC FUNCTION PANEL
ALBUMIN: 4.1 g/dL (ref 3.5–5.2)
ALT: 83 U/L — ABNORMAL HIGH (ref 0–35)
AST: 64 U/L — AB (ref 0–37)
Alkaline Phosphatase: 72 U/L (ref 39–117)
Bilirubin, Direct: 0.1 mg/dL (ref 0.0–0.3)
Total Bilirubin: 0.4 mg/dL (ref 0.2–1.2)
Total Protein: 7.1 g/dL (ref 6.0–8.3)

## 2018-05-19 MED ORDER — LINACLOTIDE 145 MCG PO CAPS: 145.0000 ug | ORAL_CAPSULE | Freq: Every day | ORAL | 3 refills | Status: DC

## 2018-05-19 NOTE — Progress Notes (Signed)
Melissa Harmon    [id number]    02-06-1956  Primary Care Physician:System, Provider Not In  Referring Physician: No referring provider defined for this encounter.  Chief complaint: Blood per rectum, constipation  HPI:  63 yr F with history of DM, hypothyroidism s/p thyroidectomy for diffuse multinodular goiter here for new patient consult with complaints of intermittent constipation and bright red blood per rectum, referred by PMD Dr Leeroy Cha  Constipation: takes stool softener every day, senna twice a week still has difficulty evacuating with hard stool. Doesn't take any  Fiber supplements Miralax on and off, makes her nauseous so does not take it regularly Metformin makes her constipated She has lot of gas and flatulence  Complains of intermittent right side abdominal pain associated with bloating, worse when she does not have regular bowel movement  Is having intermittent bright red blood small volume when she wipes after a bowel movement and sometimes covering the stool in the toilet bowl   10/2017: Hct 42.9, Hgb 14, plt 307, WBC 8 AST 96, ALT186, albumin 4.3, T.bili 0.5, Alk phos 82  Screening colonoscopy done previously by [facility].  Report not available during this visit but she was recommended follow-up colonoscopy last year for surveillance Had polyps removed on first on. She has had 2-3 colonoscopies   Outpatient Encounter Medications as of 05/19/2018  Medication Sig  . albuterol (PROVENTIL HFA;VENTOLIN HFA) 108 (90 BASE) MCG/ACT inhaler Inhale 2 puffs into the lungs every 6 (six) hours as needed for wheezing.  Marland Kitchen atorvastatin (LIPITOR) 20 MG tablet Take 20 mg by mouth daily.  . divalproex (DEPAKOTE ER) 500 MG 24 hr tablet Take 1 tablet (500 mg total) by mouth daily.  Marland Kitchen docusate sodium (COLACE) 100 MG capsule Take 1 capsule (100 mg total) by mouth 2 (two) times daily.  Marland Kitchen gabapentin (NEURONTIN) 100 MG capsule Take 100 mg by mouth at bedtime.  Marland Kitchen  levothyroxine (SYNTHROID, LEVOTHROID) 175 MCG tablet Take 175 mcg by mouth daily before breakfast.  . meloxicam (MOBIC) 7.5 MG tablet Take 1 tablet (7.5 mg total) by mouth 2 (two) times daily as needed for pain.  . metFORMIN (GLUCOPHAGE) 500 MG tablet Take 750 mg by mouth 2 (two) times daily with a meal.   . pantoprazole (PROTONIX) 40 MG tablet Take 1 tablet (40 mg total) by mouth 2 (two) times daily.  . polyethylene glycol powder (GLYCOLAX/MIRALAX) powder Take 17 g by mouth 2 (two) times daily as needed.  . senna (SENOKOT) 8.6 MG TABS tablet Take 1 tablet by mouth daily as needed for mild constipation.  . sitaGLIPtin (JANUVIA) 100 MG tablet Take 100 mg by mouth daily.  . [DISCONTINUED] omeprazole (PRILOSEC) 20 MG capsule Take 1 capsule (20 mg total) by mouth daily. (Patient not taking: Reported on 03/13/2015)   No facility-administered encounter medications on file as of 05/19/2018.     Allergies as of 05/19/2018 - Review Complete 11/18/2017  Allergen Reaction Noted  . Sulfamethoxazole Hives 12/10/2005    Past Medical History:  Diagnosis Date  . Asthma   . Carpal tunnel syndrome   . Chronic neck pain   . Diabetes mellitus   . Fibromyalgia   . History of shingles    last 2013  . Hypercholesteremia   . Thyroid disease     Past Surgical History:  Procedure Laterality Date  . CESAREAN SECTION    . THYROIDECTOMY      Family History  Problem  Relation Age of Onset  . Diabetes Mother   . Heart disease Mother   . Stroke Mother   . Bipolar disorder Father   . Schizophrenia Brother     Social History   Socioeconomic History  . Marital status: Single    Spouse name: Not on file  . Number of children: Not on file  . Years of education: Not on file  . Highest education level: Not on file  Occupational History  . Not on file  Social Needs  . Financial resource strain: Not on file  . Food insecurity:    Worry: Not on file    Inability: Not on file  . Transportation needs:     Medical: Not on file    Non-medical: Not on file  Tobacco Use  . Smoking status: Current Every Day Smoker    Packs/day: 0.25    Years: 25.00    Pack years: 6.25    Types: Cigarettes  . Smokeless tobacco: Never Used  Substance and Sexual Activity  . Alcohol use: Yes    Alcohol/week: 3.0 standard drinks    Types: 3 Cans of beer per week    Comment: occasional- 2x/month  . Drug use: No  . Sexual activity: Not on file  Lifestyle  . Physical activity:    Days per week: Not on file    Minutes per session: Not on file  . Stress: Not on file  Relationships  . Social connections:    Talks on phone: Not on file    Gets together: Not on file    Attends religious service: Not on file    Active member of club or organization: Not on file    Attends meetings of clubs or organizations: Not on file    Relationship status: Not on file  . Intimate partner violence:    Fear of current or ex partner: Not on file    Emotionally abused: Not on file    Physically abused: Not on file    Forced sexual activity: Not on file  Other Topics Concern  . 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  HENT: Negative.   Eyes: Negative for blurred vision.  Respiratory: Negative for cough, shortness of breath and wheezing.   Cardiovascular: Negative for chest pain and palpitations.  Gastrointestinal: as per HPI Genitourinary: Negative for dysuria, urgency, frequency and hematuria.  Musculoskeletal: Negative for myalgias, back pain and joint pain.    Skin: Negative for itching and rash.  Neurological: Negative for dizziness, tremors, focal weakness, seizures and loss of consciousness.  Endo/Heme/Allergies: Positive for seasonal allergies.  Psychiatric/Behavioral: Negative for depression, suicidal ideas and hallucinations.  All other systems reviewed and are negative.   Physical Exam: Vitals:   05/19/18 0839  BP: 120/70  Pulse: 79  SpO2: 97%   Body mass index is 27.36 kg/m. Gen:      No acute distress HEENT:  EOMI, sclera anicteric Neck:  No masses; no thyromegaly Lungs:    Clear to auscultation bilaterally; normal respiratory effort CV:         Regular rate and rhythm; no murmurs Abd:      + bowel sounds; soft, non-tender; no palpable masses, no distension Ext:    No edema; adequate peripheral perfusion Skin:      Warm and dry; no rash Neuro: alert and oriented x 3 Psych: normal mood and affect  Data Reviewed:  Reviewed labs, radiology imaging, old records and pertinent past GI work up   Assessment and Plan/Recommendations:  63 year old female here with complaints of constipation and intermittent rectal bleeding Due for surveillance colonoscopy with history of colon polyps Obtain records of prior colonoscopy from [facility] Schedule for colonoscopy Intermittent small-volume rectal bleeding likely secondary to hemorrhoidal hemorrhage advised patient to avoid excessive straining, will consider hemorrhoidal band ligation after colonoscopy if continues to have persistent symptoms and no other etiology for rectal bleeding Constipation: Increase dietary fiber and fluid intake Start Linzess 125 mcg daily, titrate dose based on response Fiberchoice 1 tablet twice daily  The risks and benefits as well as alternatives of endoscopic procedure(s) have been discussed and reviewed. All questions answered. The patient agrees to proceed.   Damaris Hippo , MD [phone]    CC: No ref. provider found

## 2018-05-19 NOTE — Patient Instructions (Addendum)
You have been scheduled for a colonoscopy. Please follow written instructions given to you at your visit today.  Please pick up your prep supplies at the pharmacy within the next 1-3 days. If you use inhalers (even only as needed), please bring them with you on the day of your procedure.   Go to the basement for labs today  Use Fiberchoice 1 tablet twice daily  Increase water intake 8-10 cups daily   If you are age 61 or older, your body mass index should be between 23-30. Your Body mass index is 27.36 kg/m. If this is out of the aforementioned range listed, please consider follow up with your Primary Care Provider.  If you are age 73 or younger, your body mass index should be between 19-25. Your Body mass index is 27.36 kg/m. If this is out of the aformentioned range listed, please consider follow up with your Primary Care Provider.    Thank you for choosing [facility]  Karleen Hampshire Nandigam,MD

## 2018-05-20 ENCOUNTER — Encounter: Payer: Self-pay | Admitting: Gastroenterology

## 2018-05-20 LAB — HEPATITIS A ANTIBODY, TOTAL: Hepatitis A AB,Total: REACTIVE — AB

## 2018-05-20 LAB — HEPATITIS B SURFACE ANTIGEN: Hepatitis B Surface Ag: NONREACTIVE

## 2018-05-20 LAB — HEPATITIS C ANTIBODY
Hepatitis C Ab: NONREACTIVE
SIGNAL TO CUT-OFF: 0.03 (ref <1.00)

## 2018-05-20 LAB — HEPATITIS B SURFACE ANTIBODY,QUALITATIVE: Hep B S Ab: NONREACTIVE

## 2018-05-25 ENCOUNTER — Encounter: Payer: Self-pay | Admitting: Gastroenterology

## 2018-05-25 ENCOUNTER — Ambulatory Visit (AMBULATORY_SURGERY_CENTER): Payer: Commercial Managed Care - PPO | Admitting: Gastroenterology

## 2018-05-25 VITALS — BP 129/74 | HR 65 | Temp 98.4°F | Resp 17 | Ht 65.0 in | Wt 164.0 lb

## 2018-05-25 DIAGNOSIS — Z1211 Encounter for screening for malignant neoplasm of colon: Secondary | ICD-10-CM | POA: Diagnosis not present

## 2018-05-25 DIAGNOSIS — D128 Benign neoplasm of rectum: Secondary | ICD-10-CM

## 2018-05-25 DIAGNOSIS — Z860100 Personal history of colon polyps, unspecified: Secondary | ICD-10-CM

## 2018-05-25 DIAGNOSIS — D121 Benign neoplasm of appendix: Secondary | ICD-10-CM | POA: Diagnosis not present

## 2018-05-25 DIAGNOSIS — D122 Benign neoplasm of ascending colon: Secondary | ICD-10-CM

## 2018-05-25 DIAGNOSIS — D120 Benign neoplasm of cecum: Secondary | ICD-10-CM | POA: Diagnosis not present

## 2018-05-25 MED ORDER — SODIUM CHLORIDE 0.9 % IV SOLN: 500.0000 mL | Freq: Once | INTRAVENOUS | Status: DC

## 2018-05-25 NOTE — Op Note (Signed)
[place] Patient Name: Melissa Harmon Procedure Date: 05/25/2018 10:01 AM MRN: [id number] Endoscopist: Mauri Pole , MD Age: 63 Referring MD:  Date of Birth: 07-23-1955 Gender: Female Account #: [id number] Procedure:                Colonoscopy Indications:              High risk colon cancer surveillance: Personal                            history of colonic polyps, Last colonoscopy: date                            unknown (unable to locate last colonoscopy report) Medicines:                Monitored Anesthesia Care Procedure:                Pre-Anesthesia Assessment:                           - Prior to the procedure, a History and Physical                            was performed, and patient medications and                            allergies were reviewed. The patient's tolerance of                            previous anesthesia was also reviewed. The risks                            and benefits of the procedure and the sedation                            options and risks were discussed with the patient.                            All questions were answered, and informed consent                            was obtained. Prior Anticoagulants: The patient has                            taken no previous anticoagulant or antiplatelet                            agents. ASA Grade Assessment: II - A patient with                            mild systemic disease. After reviewing the risks                            and benefits, the patient was deemed in  satisfactory condition to undergo the procedure.                           After obtaining informed consent, the colonoscope                            was passed under direct vision. Throughout the                            procedure, the patient's blood pressure, pulse, and                            oxygen saturations were monitored continuously. The   Colonoscope was introduced through the anus and                            advanced to the the cecum, identified by                            appendiceal orifice and ileocecal valve. The                            colonoscopy was performed without difficulty. The                            patient tolerated the procedure well. The quality                            of the bowel preparation was excellent. The                            ileocecal valve, appendiceal orifice, and rectum                            were photographed. Scope In: 10:08:58 AM Scope Out: 10:32:52 AM Scope Withdrawal Time: 0 hours 20 minutes 7 seconds  Total Procedure Duration: 0 hours 23 minutes 54 seconds  Findings:                 The perianal and digital rectal examinations were                            normal.                           Four sessile polyps were found in the rectum, cecum                            and appendiceal orifice. The polyps were 4 to 12 mm                            in size. These polyps were removed with a cold                            snare. Resection and retrieval were complete.  Two semi-pedunculated polyps were found in the                            rectum and ascending colon. The polyps were 12 to                            16 mm in size. These polyps were removed with a hot                            snare. Resection and retrieval were complete.                           A 2 mm polyp was found in the ascending colon. The                            polyp was sessile. The polyp was removed with a                            cold biopsy forceps. Resection and retrieval were                            complete.                           Scattered small and large-mouthed diverticula were                            found in the sigmoid colon. There was evidence of                            an impacted diverticulum.                           Non-bleeding  internal hemorrhoids were found during                            retroflexion. The hemorrhoids were small. Complications:            No immediate complications. Estimated Blood Loss:     Estimated blood loss was minimal. Impression:               - Four 4 to 12 mm polyps in the rectum, in the                            cecum and at the appendiceal orifice, removed with                            a cold snare. Resected and retrieved.                           - Two 12 to 16 mm polyps in the rectum and in the  ascending colon, removed with a hot snare. Resected                            and retrieved.                           - One 2 mm polyp in the ascending colon, removed                            with a cold biopsy forceps. Resected and retrieved.                           - Moderate diverticulosis in the sigmoid colon.                            There was evidence of an impacted diverticulum.                           - Non-bleeding internal hemorrhoids. Recommendation:           - Patient has a contact number available for                            emergencies. The signs and symptoms of potential                            delayed complications were discussed with the                            patient. Return to normal activities tomorrow.                            Written discharge instructions were provided to the                            patient.                           - Resume previous diet.                           - Continue present medications.                           - Await pathology results.                           - Repeat colonoscopy in 1-3 years for surveillance                            based on pathology results. Mauri Pole, MD 05/25/2018 10:39:23 AM This report has been signed electronically.

## 2018-05-25 NOTE — Patient Instructions (Signed)
Continue present medications. Await pathology results. Please read handouts provided.       YOU HAD AN ENDOSCOPIC PROCEDURE TODAY AT THE  ENDOSCOPY CENTER:   Refer to the procedure report that was given to you for any specific questions about what was found during the examination.  If the procedure report does not answer your questions, please call your gastroenterologist to clarify.  If you requested that your care partner not be given the details of your procedure findings, then the procedure report has been included in a sealed envelope for you to review at your convenience later.  YOU SHOULD EXPECT: Some feelings of bloating in the abdomen. Passage of more gas than usual.  Walking can help get rid of the air that was put into your GI tract during the procedure and reduce the bloating. If you had a lower endoscopy (such as a colonoscopy or flexible sigmoidoscopy) you may notice spotting of blood in your stool or on the toilet paper. If you underwent a bowel prep for your procedure, you may not have a normal bowel movement for a few days.  Please Note:  You might notice some irritation and congestion in your nose or some drainage.  This is from the oxygen used during your procedure.  There is no need for concern and it should clear up in a day or so.  SYMPTOMS TO REPORT IMMEDIATELY:   Following lower endoscopy (colonoscopy or flexible sigmoidoscopy):  Excessive amounts of blood in the stool  Significant tenderness or worsening of abdominal pains  Swelling of the abdomen that is new, acute  Fever of 100F or higher   For urgent or emergent issues, a gastroenterologist can be reached at any hour by calling [phone].   DIET:  We do recommend a small meal at first, but then you may proceed to your regular diet.  Drink plenty of fluids but you should avoid alcoholic beverages for 24 hours.  ACTIVITY:  You should plan to take it easy for the rest of today and you should NOT  DRIVE or use heavy machinery until tomorrow (because of the sedation medicines used during the test).    FOLLOW UP: Our staff will call the number listed on your records the next business day following your procedure to check on you and address any questions or concerns that you may have regarding the information given to you following your procedure. If we do not reach you, we will leave a message.  However, if you are feeling well and you are not experiencing any problems, there is no need to return our call.  We will assume that you have returned to your regular daily activities without incident.  If any biopsies were taken you will be contacted by phone or by letter within the next 1-3 weeks.  Please call us at [phone] if you have not heard about the biopsies in 3 weeks.    SIGNATURES/CONFIDENTIALITY: You and/or your care partner have signed paperwork which will be entered into your electronic medical record.  These signatures attest to the fact that that the information above on your After Visit Summary has been reviewed and is understood.  Full responsibility of the confidentiality of this discharge information lies with you and/or your care-partner. 

## 2018-05-25 NOTE — Progress Notes (Signed)
To PACU, VSS. Report to [email or web address] 

## 2018-05-25 NOTE — Progress Notes (Signed)
Called to room to assist during endoscopic procedure.  Patient ID and intended procedure confirmed with present staff. Received instructions for my participation in the procedure from the performing physician.  

## 2018-05-26 ENCOUNTER — Telehealth: Payer: Self-pay

## 2018-05-26 NOTE — Telephone Encounter (Signed)
  Follow up Call-  Call back number 05/25/2018  Post procedure Call Back phone  # [phone]  Permission to leave phone message Yes  Some recent data might be hidden     Patient questions:  Do you have a fever, pain , or abdominal swelling? No. Pain Score  0 *  Have you tolerated food without any problems? Yes.    Have you been able to return to your normal activities? Yes.    Do you have any questions about your discharge instructions: Diet   No. Medications  No. Follow up visit  No.  Do you have questions or concerns about your Care? No.  Actions: * If pain score is 4 or above: No action needed, pain <4.

## 2018-05-31 ENCOUNTER — Encounter: Payer: Self-pay | Admitting: Gastroenterology

## 2018-06-13 DIAGNOSIS — M65331 Trigger finger, right middle finger: Secondary | ICD-10-CM | POA: Diagnosis not present

## 2018-06-20 ENCOUNTER — Other Ambulatory Visit: Payer: Self-pay

## 2018-06-21 ENCOUNTER — Ambulatory Visit (INDEPENDENT_AMBULATORY_CARE_PROVIDER_SITE_OTHER): Payer: Commercial Managed Care - PPO | Admitting: Gastroenterology

## 2018-06-21 DIAGNOSIS — Z23 Encounter for immunization: Secondary | ICD-10-CM

## 2018-06-24 ENCOUNTER — Telehealth: Payer: Self-pay | Admitting: Gastroenterology

## 2018-06-24 DIAGNOSIS — Z124 Encounter for screening for malignant neoplasm of cervix: Secondary | ICD-10-CM | POA: Diagnosis not present

## 2018-06-24 DIAGNOSIS — Z01419 Encounter for gynecological examination (general) (routine) without abnormal findings: Secondary | ICD-10-CM | POA: Diagnosis not present

## 2018-06-24 DIAGNOSIS — Z6827 Body mass index (BMI) 27.0-27.9, adult: Secondary | ICD-10-CM | POA: Diagnosis not present

## 2018-06-24 DIAGNOSIS — L293 Anogenital pruritus, unspecified: Secondary | ICD-10-CM | POA: Diagnosis not present

## 2018-06-24 NOTE — Telephone Encounter (Signed)
Heplasav injection given on 06/21/2018. The patient has some aching of the arm in which she got the vaccine. She is advised it is okay to take Tylenol for her discomfort. She denies rash, blisters or fever.

## 2018-06-24 NOTE — Telephone Encounter (Signed)
Pt wants to know if she can take OTC for pain, she had hep injection few days ago.

## 2018-06-28 DIAGNOSIS — R0609 Other forms of dyspnea: Secondary | ICD-10-CM | POA: Diagnosis not present

## 2018-06-28 DIAGNOSIS — E785 Hyperlipidemia, unspecified: Secondary | ICD-10-CM | POA: Diagnosis not present

## 2018-06-28 DIAGNOSIS — E1140 Type 2 diabetes mellitus with diabetic neuropathy, unspecified: Secondary | ICD-10-CM | POA: Diagnosis not present

## 2018-06-28 DIAGNOSIS — R7989 Other specified abnormal findings of blood chemistry: Secondary | ICD-10-CM | POA: Diagnosis not present

## 2018-06-28 DIAGNOSIS — R748 Abnormal levels of other serum enzymes: Secondary | ICD-10-CM | POA: Diagnosis not present

## 2018-06-28 DIAGNOSIS — E039 Hypothyroidism, unspecified: Secondary | ICD-10-CM | POA: Diagnosis not present

## 2018-07-21 ENCOUNTER — Other Ambulatory Visit: Payer: Self-pay

## 2018-07-21 ENCOUNTER — Ambulatory Visit (INDEPENDENT_AMBULATORY_CARE_PROVIDER_SITE_OTHER): Payer: Commercial Managed Care - PPO | Admitting: Gastroenterology

## 2018-07-21 DIAGNOSIS — Z23 Encounter for immunization: Secondary | ICD-10-CM

## 2018-08-06 ENCOUNTER — Other Ambulatory Visit (HOSPITAL_COMMUNITY): Payer: Self-pay | Admitting: Psychiatry

## 2018-08-06 DIAGNOSIS — F331 Major depressive disorder, recurrent, moderate: Secondary | ICD-10-CM

## 2018-08-06 DIAGNOSIS — F4322 Adjustment disorder with anxiety: Secondary | ICD-10-CM

## 2018-08-06 DIAGNOSIS — F99 Mental disorder, not otherwise specified: Secondary | ICD-10-CM

## 2018-08-06 DIAGNOSIS — F5105 Insomnia due to other mental disorder: Secondary | ICD-10-CM

## 2018-08-08 ENCOUNTER — Telehealth: Payer: Self-pay | Admitting: *Deleted

## 2018-08-08 NOTE — Telephone Encounter (Signed)
Spoke with patient she does not want to do the [facility] or Zoom visit and wants [place] to reschedule her to be seen in the office in May  Will schedule her for a May appointment

## 2018-08-10 ENCOUNTER — Ambulatory Visit: Payer: Self-pay | Admitting: Gastroenterology

## 2018-08-12 ENCOUNTER — Ambulatory Visit: Payer: Self-pay | Admitting: Gastroenterology

## 2018-08-29 DIAGNOSIS — E1140 Type 2 diabetes mellitus with diabetic neuropathy, unspecified: Secondary | ICD-10-CM | POA: Diagnosis not present

## 2018-08-29 DIAGNOSIS — R0600 Dyspnea, unspecified: Secondary | ICD-10-CM | POA: Diagnosis not present

## 2018-08-29 DIAGNOSIS — E785 Hyperlipidemia, unspecified: Secondary | ICD-10-CM | POA: Diagnosis not present

## 2018-09-02 ENCOUNTER — Encounter: Payer: Self-pay | Admitting: Gastroenterology

## 2018-09-23 ENCOUNTER — Telehealth: Payer: Self-pay

## 2018-09-23 NOTE — Telephone Encounter (Signed)
Called patient with no answer and cannot leave a message.  Covid-19 travel screening questions  Have you traveled in the last 14 days? If yes where?  Do you now or have you had a fever in the last 14 days?  Do you have any respiratory symptoms of shortness of breath or cough now or in the last 14 days?  Do you have a medical history of Congestive Heart Failure?  Do you have a medical history of lung disease?  Do you have any family members or close contacts with diagnosed or suspected Covid-19?

## 2018-09-26 ENCOUNTER — Ambulatory Visit: Payer: Commercial Managed Care - PPO | Admitting: Gastroenterology

## 2018-09-26 NOTE — Telephone Encounter (Signed)
Covid-19 travel screening questions  Have you traveled in the last 14 days? If yes where?    Do you now or have you had a fever in the last 14 days?  Do you have any respiratory symptoms of shortness of breath or cough now or in the last 14 days?  Do you have a medical history of Congestive Heart Failure?  Do you have a medical history of lung disease?  Do you have any family members or close contacts with diagnosed or suspected Covid-19?    Tried to contact patient x 2  No answer  No Voicemail

## 2018-09-27 DIAGNOSIS — D270 Benign neoplasm of right ovary: Secondary | ICD-10-CM | POA: Diagnosis not present

## 2018-09-27 DIAGNOSIS — R102 Pelvic and perineal pain: Secondary | ICD-10-CM | POA: Diagnosis not present

## 2018-10-06 ENCOUNTER — Encounter: Payer: Self-pay | Admitting: General Surgery

## 2018-10-07 ENCOUNTER — Ambulatory Visit (INDEPENDENT_AMBULATORY_CARE_PROVIDER_SITE_OTHER): Payer: Commercial Managed Care - PPO | Admitting: Gastroenterology

## 2018-10-07 ENCOUNTER — Other Ambulatory Visit (INDEPENDENT_AMBULATORY_CARE_PROVIDER_SITE_OTHER): Payer: Commercial Managed Care - PPO

## 2018-10-07 ENCOUNTER — Other Ambulatory Visit: Payer: Self-pay

## 2018-10-07 VITALS — Ht 65.0 in | Wt 162.0 lb

## 2018-10-07 DIAGNOSIS — R945 Abnormal results of liver function studies: Secondary | ICD-10-CM | POA: Diagnosis not present

## 2018-10-07 DIAGNOSIS — R7989 Other specified abnormal findings of blood chemistry: Secondary | ICD-10-CM

## 2018-10-07 DIAGNOSIS — F1010 Alcohol abuse, uncomplicated: Secondary | ICD-10-CM

## 2018-10-07 LAB — COMPREHENSIVE METABOLIC PANEL
ALT: 132 U/L — ABNORMAL HIGH (ref 0–35)
AST: 80 U/L — ABNORMAL HIGH (ref 0–37)
Albumin: 3.8 g/dL (ref 3.5–5.2)
Alkaline Phosphatase: 102 U/L (ref 39–117)
BUN: 13 mg/dL (ref 6–23)
CO2: 24 mEq/L (ref 19–32)
Calcium: 8.7 mg/dL (ref 8.4–10.5)
Chloride: 104 mEq/L (ref 96–112)
Creatinine, Ser: 0.67 mg/dL (ref 0.40–1.20)
GFR: 108.33 mL/min (ref >60.00)
Glucose, Bld: 140 mg/dL — ABNORMAL HIGH (ref 70–99)
Potassium: 3.7 mEq/L (ref 3.5–5.1)
Sodium: 138 mEq/L (ref 135–145)
Total Bilirubin: 0.5 mg/dL (ref 0.2–1.2)
Total Protein: 6.9 g/dL (ref 6.0–8.3)

## 2018-10-07 LAB — PROTIME-INR
INR: 1.1 ratio — ABNORMAL HIGH (ref 0.8–1.0)
Prothrombin Time: 12.3 s (ref 9.6–13.1)

## 2018-10-07 NOTE — Progress Notes (Signed)
Melissa Harmon    [id number]    05/05/1956  Primary Care Physician:Varadarajan, Ronie Spies, MD  Referring Physician: Leeroy Cha, MD [address] [place], [place] [address]  This service was provided via audio and video telemedicine (Doximity) due to [place] 19 pandemic.  Patient location: Home Provider location: Office Used 2 patient identifiers to confirm the correct person. Explained the limitations in evaluation and management via telemedicine. Patient is aware of potential medical charges for this visit.  Patient consented to this virtual visit.  The persons participating in this telemedicine service were myself and the patient   Chief complaint: Abnormal LFT  HPI: 63 year old female for follow-up visit to discuss abnormal LFT AST 64, ALT 83, bilirubin 0.4 and alkaline phosphatase 72 Hepatitis B and C negative.  Reactive to hepatitis A antibody total  Colonoscopy: Removal of multiple adenomatous polyps >1cm in size  She drinks 6 to 12 pack beer a week, usually binge drinks over the weekend or on social occasions.  Denies any herbal remedies or drug abuse. She takes NSAIDs intermittently  Denies any nausea, vomiting, abdominal pain, melena or bright red blood per rectum   Outpatient Encounter Medications as of 10/07/2018  Medication Sig  . albuterol (PROVENTIL HFA;VENTOLIN HFA) 108 (90 BASE) MCG/ACT inhaler Inhale 2 puffs into the lungs every 6 (six) hours as needed for wheezing.  Marland Kitchen atorvastatin (LIPITOR) 20 MG tablet Take 20 mg by mouth daily.  . divalproex (DEPAKOTE ER) 500 MG 24 hr tablet Take 1 tablet (500 mg total) by mouth daily.  Marland Kitchen docusate sodium (COLACE) 100 MG capsule Take 1 capsule (100 mg total) by mouth 2 (two) times daily.  Marland Kitchen gabapentin (NEURONTIN) 100 MG capsule Take 100 mg by mouth at bedtime.  Marland Kitchen levothyroxine (SYNTHROID, LEVOTHROID) 175 MCG tablet Take 175 mcg by mouth daily before breakfast.  . linaclotide (LINZESS) 145  MCG CAPS capsule Take 1 capsule (145 mcg total) by mouth daily before breakfast.  . metFORMIN (GLUCOPHAGE) 500 MG tablet Take 750 mg by mouth 2 (two) times daily with a meal.   . pantoprazole (PROTONIX) 40 MG tablet Take 1 tablet (40 mg total) by mouth 2 (two) times daily.  . polyethylene glycol powder (GLYCOLAX/MIRALAX) powder Take 17 g by mouth 2 (two) times daily as needed.  . senna (SENOKOT) 8.6 MG TABS tablet Take 1 tablet by mouth daily as needed for mild constipation.  . sitaGLIPtin (JANUVIA) 100 MG tablet Take 100 mg by mouth daily.  . [DISCONTINUED] omeprazole (PRILOSEC) 20 MG capsule Take 1 capsule (20 mg total) by mouth daily. (Patient not taking: Reported on 03/13/2015)   No facility-administered encounter medications on file as of 10/07/2018.     Allergies as of 10/07/2018 - Review Complete 10/06/2018  Allergen Reaction Noted  . Sulfamethoxazole Hives 12/10/2005    Past Medical History:  Diagnosis Date  . Asthma   . Carpal tunnel syndrome   . Chronic neck pain   . Diabetes mellitus   . Fibromyalgia   . History of shingles    last 2013  . Hypercholesteremia   . Thyroid disease     Past Surgical History:  Procedure Laterality Date  . CESAREAN SECTION    . THYROIDECTOMY      Family History  Problem Relation Age of Onset  . Diabetes Mother   . Heart disease Mother   . Stroke Mother   . Bipolar disorder Father   . Schizophrenia Brother  Social History   Socioeconomic History  . Marital status: Single    Spouse name: Not on file  . Number of children: Not on file  . Years of education: Not on file  . Highest education level: Not on file  Occupational History  . Not on file  Social Needs  . Financial resource strain: Not on file  . Food insecurity:    Worry: Not on file    Inability: Not on file  . Transportation needs:    Medical: Not on file    Non-medical: Not on file  Tobacco Use  . Smoking status: Current Every Day Smoker    Packs/day: 0.25     Years: 25.00    Pack years: 6.25    Types: Cigarettes  . Smokeless tobacco: Never Used  Substance and Sexual Activity  . Alcohol use: Yes    Alcohol/week: 3.0 standard drinks    Types: 3 Cans of beer per week    Comment: occasional- 2x/month  . Drug use: No  . Sexual activity: Not on file  Lifestyle  . Physical activity:    Days per week: Not on file    Minutes per session: Not on file  . Stress: Not on file  Relationships  . Social connections:    Talks on phone: Not on file    Gets together: Not on file    Attends religious service: Not on file    Active member of club or organization: Not on file    Attends meetings of clubs or organizations: Not on file    Relationship status: Not on file  . Intimate partner violence:    Fear of current or ex partner: Not on file    Emotionally abused: Not on file    Physically abused: Not on file    Forced sexual activity: Not on file  Other Topics Concern  . 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   Physical Exam: Vitals were not taken and physical exam was not performed during this virtual visit.  Data Reviewed:  Reviewed labs, radiology imaging, old records and pertinent past GI work up   Assessment and Plan/Recommendations:  63 year old female with abnormal LFT, predominantly elevated transaminases likely secondary to steatohepatitis and alcohol abuse Follow-up CMP Check ANA, AMA, anti-smooth body antibody, PT and INR to exclude chronic liver disease  Avoid herbal remedies, NSAIDs and also discussed alcohol cessation Follow-up in 3 months or sooner if needed    K. Denzil Magnuson , MD   CC: Leeroy Cha

## 2018-10-07 NOTE — Patient Instructions (Addendum)
Your provider has requested that you go to the basement level for lab work before leaving today. Press "B" on the elevator. The lab is located at the first door on the left as you exit the elevator.  Check CMP, ANA, AMA, anti Sm Ab,  PT and INR  Avoid alcohol, NSAID, herbal supplements  Follow up 3 months.  Dr. Woodward Ku schedule is not out that far yet so please call the office next month to schedule a follow up for around the end of August.

## 2018-10-09 ENCOUNTER — Encounter: Payer: Self-pay | Admitting: Gastroenterology

## 2018-10-10 ENCOUNTER — Other Ambulatory Visit: Payer: Self-pay

## 2018-10-10 DIAGNOSIS — R7989 Other specified abnormal findings of blood chemistry: Secondary | ICD-10-CM

## 2018-10-10 DIAGNOSIS — R945 Abnormal results of liver function studies: Secondary | ICD-10-CM

## 2018-10-11 LAB — ANTI-NUCLEAR AB-TITER (ANA TITER)
ANA TITER: 1:320 {titer} — ABNORMAL HIGH
ANA TITER: 1:80 {titer} — ABNORMAL HIGH
ANA Titer 1: >=1:1280 {titer} — AB

## 2018-10-11 LAB — ANTI-SMOOTH MUSCLE ANTIBODY, IGG: Actin (Smooth Muscle) Antibody (IGG): 26 U — ABNORMAL HIGH (ref <20)

## 2018-10-11 LAB — ANA: Anti Nuclear Antibody (ANA): POSITIVE — AB

## 2018-10-11 LAB — MITOCHONDRIAL ANTIBODIES: Mitochondrial M2 Ab, IgG: 56.9 U — ABNORMAL HIGH

## 2018-10-13 ENCOUNTER — Telehealth: Payer: Self-pay | Admitting: Gastroenterology

## 2018-10-13 NOTE — Telephone Encounter (Signed)
Patient called said that Dr. Silverio Decamp took her off all over the count meds, but would like to know if she can take (pain relief) instead of tyloenol

## 2018-10-13 NOTE — Telephone Encounter (Signed)
Dr Nandigam Please advise  

## 2018-10-14 NOTE — Telephone Encounter (Signed)
Spoke with patient and she was having Right sided abdominal and took a generic pain relief medication yesterday and feels better today  I told her to use sparingly as advised by Dr Silverio Decamp

## 2018-10-14 NOTE — Telephone Encounter (Signed)
Very sparingly only if Tylenol is not helping, as can cause liver dysfunction

## 2018-10-20 ENCOUNTER — Inpatient Hospital Stay (HOSPITAL_COMMUNITY)
Admission: RE | Admit: 2018-10-20 | Discharge: 2018-10-20 | Disposition: A | Discharge status: Home / Self Care | Payer: Commercial Managed Care - PPO | Source: Ambulatory Visit

## 2018-10-21 ENCOUNTER — Other Ambulatory Visit: Payer: Self-pay | Admitting: Gastroenterology

## 2018-10-25 ENCOUNTER — Ambulatory Visit (HOSPITAL_BASED_OUTPATIENT_CLINIC_OR_DEPARTMENT_OTHER): Admit: 2018-10-25 | Payer: Commercial Managed Care - PPO | Admitting: Obstetrics and Gynecology

## 2018-10-25 ENCOUNTER — Encounter (HOSPITAL_BASED_OUTPATIENT_CLINIC_OR_DEPARTMENT_OTHER): Payer: Self-pay

## 2018-10-25 SURGERY — OOPHORECTOMY, LAPAROSCOPIC
Anesthesia: General | Laterality: Right

## 2018-10-27 ENCOUNTER — Other Ambulatory Visit: Payer: Self-pay | Admitting: *Deleted

## 2018-10-27 DIAGNOSIS — R945 Abnormal results of liver function studies: Secondary | ICD-10-CM

## 2018-10-27 DIAGNOSIS — R7989 Other specified abnormal findings of blood chemistry: Secondary | ICD-10-CM

## 2018-10-27 MED ORDER — PREDNISONE 5 MG PO TABS: 20.0000 mg | ORAL_TABLET | Freq: Every day | ORAL | 0 refills | Status: DC

## 2018-10-28 ENCOUNTER — Telehealth: Payer: Self-pay | Admitting: Gastroenterology

## 2018-10-28 ENCOUNTER — Other Ambulatory Visit: Payer: Self-pay | Admitting: *Deleted

## 2018-10-28 MED ORDER — PREDNISONE 5 MG PO TABS: 20.0000 mg | ORAL_TABLET | Freq: Every day | ORAL | 0 refills | Status: AC

## 2018-10-28 MED ORDER — PREDNISONE 5 MG PO TABS: 20.0000 mg | ORAL_TABLET | Freq: Every day | ORAL | 0 refills | Status: DC

## 2018-10-28 NOTE — Telephone Encounter (Signed)
Brittani, looks like you were discussing this with the patient and sent the prescription. Please resend. The prescription may have printed.

## 2018-10-28 NOTE — Telephone Encounter (Signed)
Pt states pharmacy is telling her that they did not receive the RX. Please resend.  Pt is requesting call back when RX has been sent.

## 2018-10-28 NOTE — Telephone Encounter (Signed)
This RN has spoken to the pharmacy staff TWICE and confirmed both times that the prescription is there and is being processed.    Patient called again to let her know that the prescription is indeed at the pharmacy on file and is in process.

## 2018-10-28 NOTE — Telephone Encounter (Signed)
Patient called and stated that the pharmacy advised prescription has not been received. She would like a call back once pharmacy has been contacted.

## 2018-10-28 NOTE — Telephone Encounter (Addendum)
This RN has printed the prescription and will fax to the patients pharmacy. Pharmacy verified they received the faxed prescription. Patient notified.

## 2018-11-10 ENCOUNTER — Telehealth: Payer: Self-pay | Admitting: *Deleted

## 2018-11-10 NOTE — Telephone Encounter (Signed)
Spoke with the patient, scheduled f/u with the patient on 7/28 at 3:00 pm.

## 2018-11-14 ENCOUNTER — Other Ambulatory Visit (INDEPENDENT_AMBULATORY_CARE_PROVIDER_SITE_OTHER): Payer: Commercial Managed Care - PPO

## 2018-11-14 DIAGNOSIS — R7989 Other specified abnormal findings of blood chemistry: Secondary | ICD-10-CM

## 2018-11-14 DIAGNOSIS — R945 Abnormal results of liver function studies: Secondary | ICD-10-CM

## 2018-11-14 LAB — COMPREHENSIVE METABOLIC PANEL
ALT: 30 U/L (ref 0–35)
AST: 16 U/L (ref 0–37)
Albumin: 4 g/dL (ref 3.5–5.2)
Alkaline Phosphatase: 69 U/L (ref 39–117)
BUN: 13 mg/dL (ref 6–23)
CO2: 25 mEq/L (ref 19–32)
Calcium: 8.9 mg/dL (ref 8.4–10.5)
Chloride: 102 mEq/L (ref 96–112)
Creatinine, Ser: 0.74 mg/dL (ref 0.40–1.20)
GFR: 96.56 mL/min (ref >60.00)
Glucose, Bld: 194 mg/dL — ABNORMAL HIGH (ref 70–99)
Potassium: 3.5 mEq/L (ref 3.5–5.1)
Sodium: 137 mEq/L (ref 135–145)
Total Bilirubin: 0.5 mg/dL (ref 0.2–1.2)
Total Protein: 6.9 g/dL (ref 6.0–8.3)

## 2018-11-14 LAB — BILIRUBIN, DIRECT: Bilirubin, Direct: 0.1 mg/dL (ref 0.0–0.3)

## 2018-11-16 ENCOUNTER — Telehealth: Payer: Self-pay | Admitting: Gastroenterology

## 2018-11-16 NOTE — Telephone Encounter (Signed)
Patient is advised of her labs. LFT's returned to normal. She will continue the prednisone and abstain from alcohol. Follow up as directed.

## 2018-11-16 NOTE — Telephone Encounter (Signed)
Beth please see below. Thanks routed to brittani in error.Thanks

## 2018-11-16 NOTE — Telephone Encounter (Signed)
Patient called to discuss lab results

## 2018-11-19 ENCOUNTER — Other Ambulatory Visit: Payer: Self-pay | Admitting: Gastroenterology

## 2018-11-26 IMAGING — CR DG CERVICAL SPINE COMPLETE 4+V
6 series · 6 of 6 positions shown · non-contrast
Comparison: Radiographs January 17, 2013.

CLINICAL DATA: Neck and left shoulder pain for 2 weeks without
known injury.

EXAM:
CERVICAL SPINE - COMPLETE 4+ VIEW

[w c-spine lat]
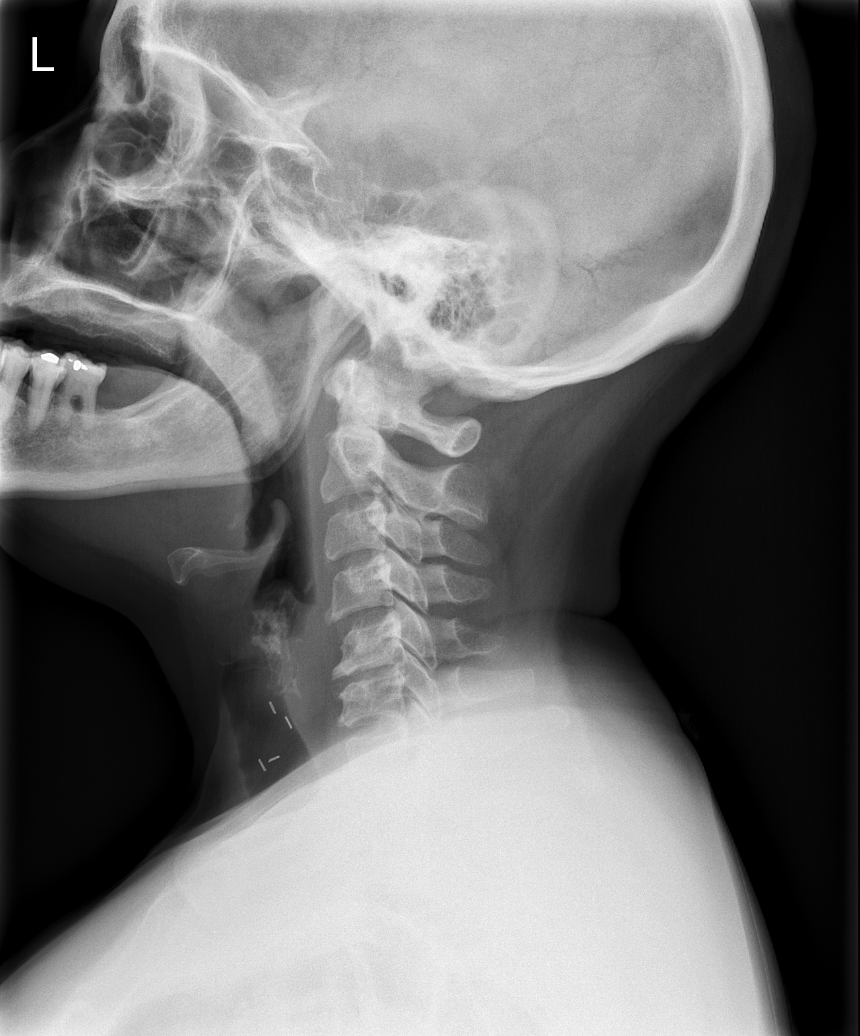

[w c-spine oblique (1 of 2)]
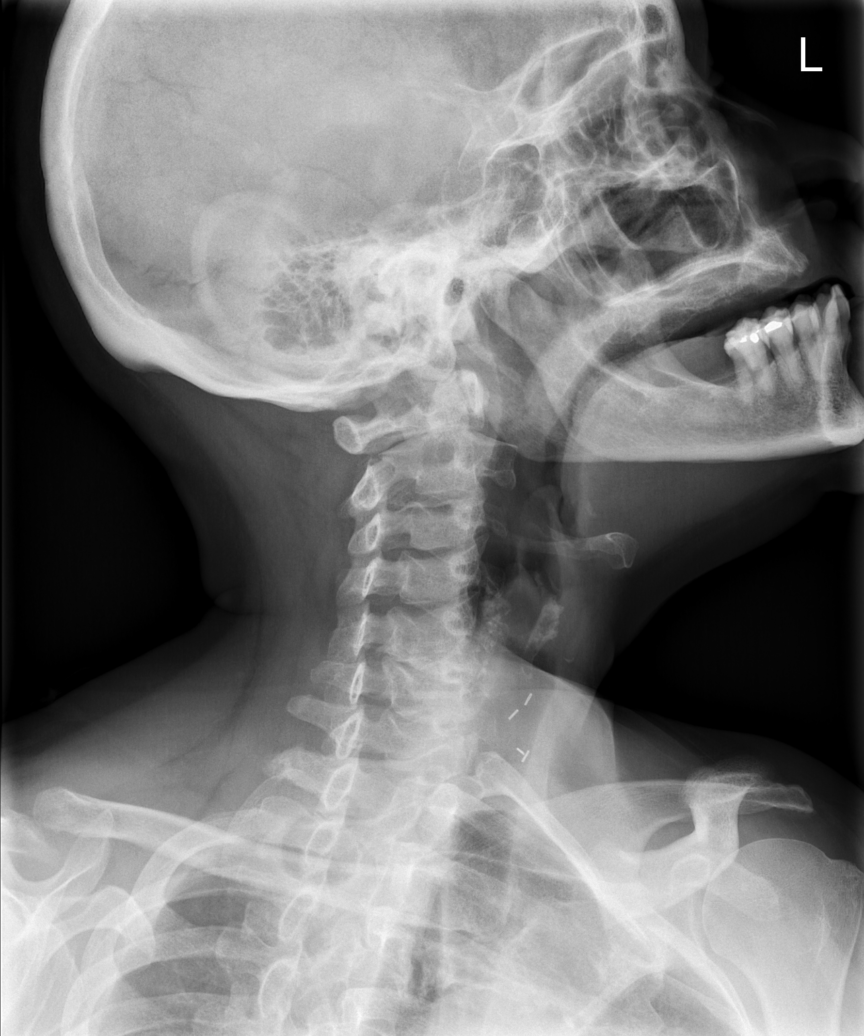

[w c-spine oblique (2 of 2)]
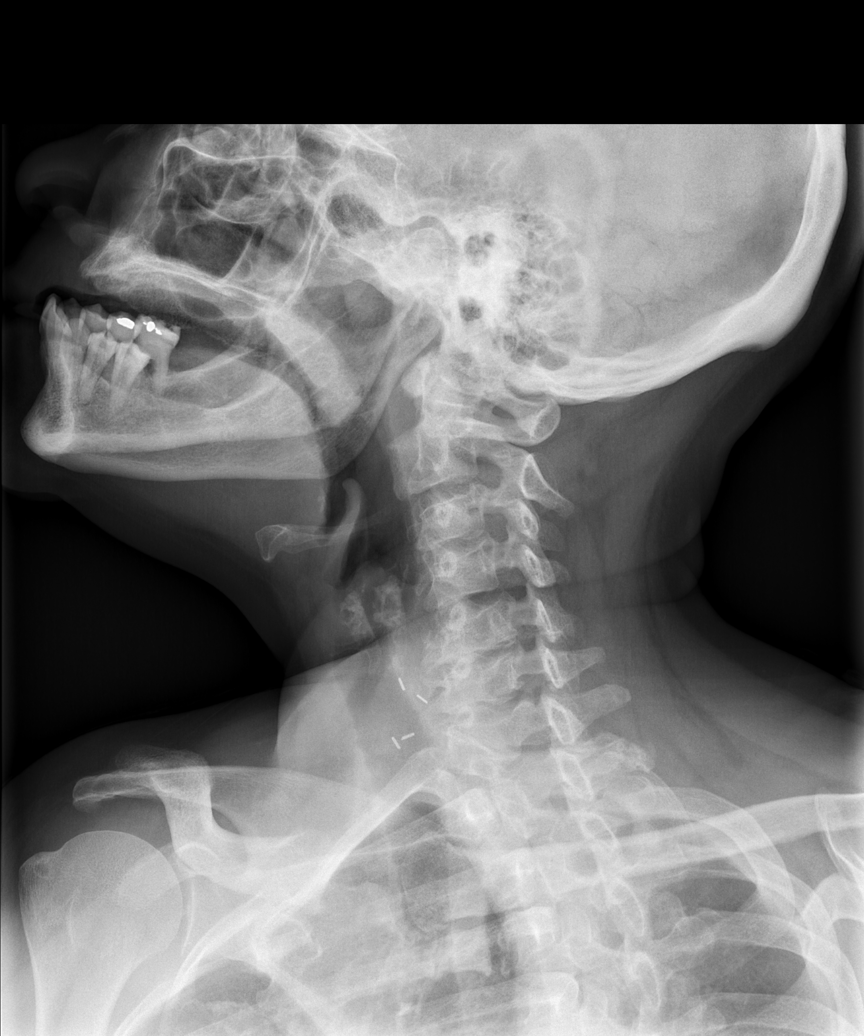

[w c-spine a.p. *]
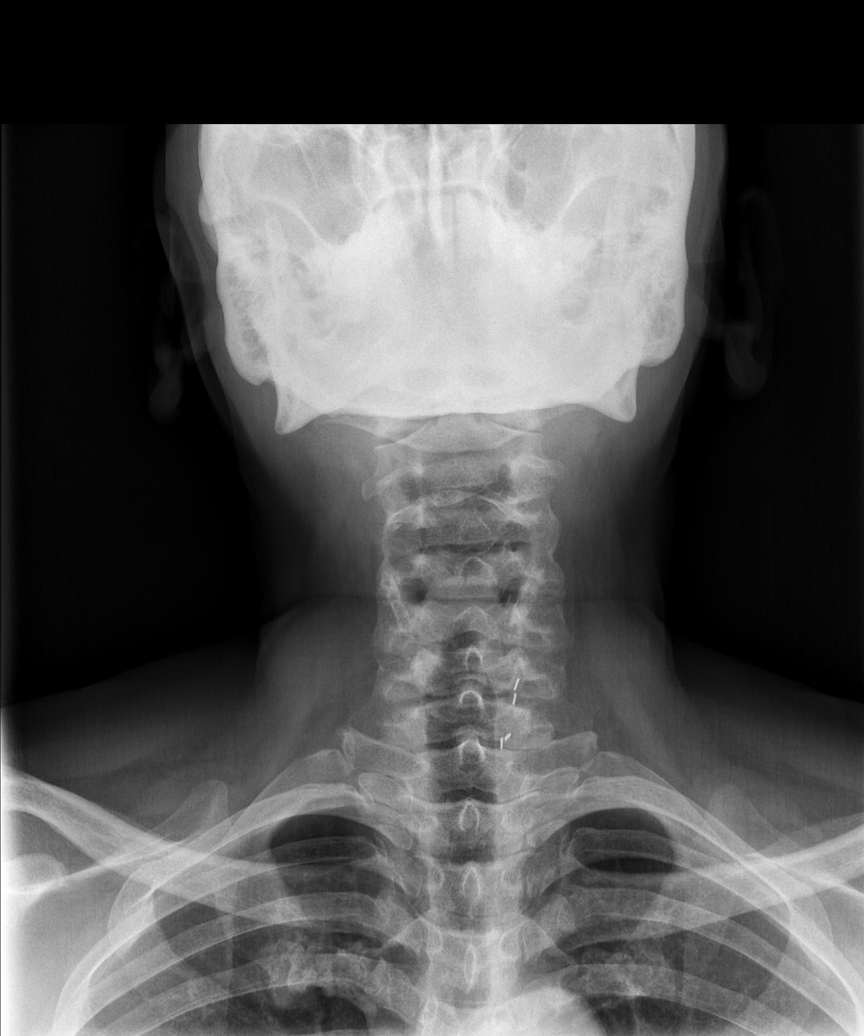

[w c-spine odontoid *]
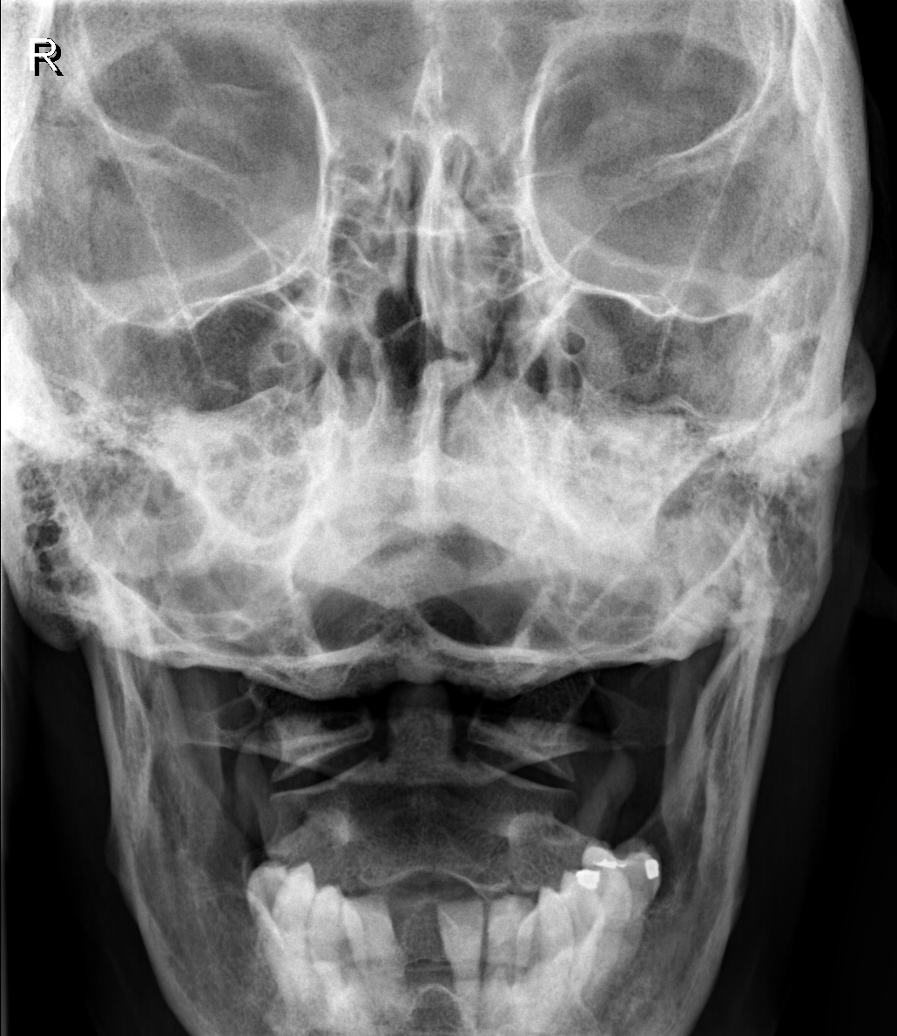

[w swimmers view *]
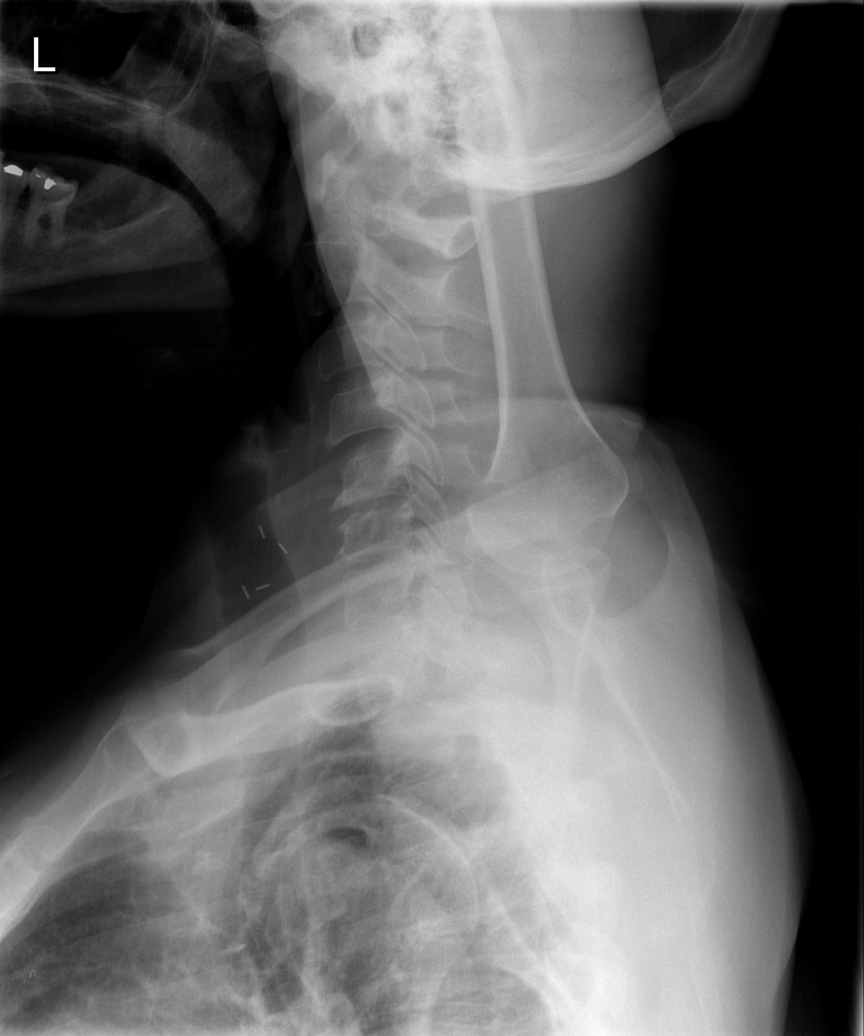

[6 of 6 positions shown; findings below may reference images not displayed]

FINDINGS: No fracture or spondylolisthesis is noted. No prevertebral soft
tissue swelling is noted. Moderate degenerative disc disease is
noted at C5-6 with anterior osteophyte formation. Mild degenerative
disc disease is noted at C6-7 with anterior osteophyte formation.
Mild bilateral neural foraminal stenosis is noted at C5-6 secondary
to uncovertebral spurring.
IMPRESSION: Multilevel degenerative disc disease is noted. Mild bilateral neural
foraminal stenosis is noted at C5-6 secondary to uncovertebral
spurring. No acute abnormality seen in the cervical spine.

## 2018-12-06 ENCOUNTER — Ambulatory Visit: Payer: Commercial Managed Care - PPO | Admitting: Gastroenterology

## 2019-04-29 ENCOUNTER — Other Ambulatory Visit: Payer: Self-pay | Admitting: Gastroenterology

## 2019-08-24 ENCOUNTER — Other Ambulatory Visit: Payer: Self-pay

## 2019-08-24 ENCOUNTER — Encounter (HOSPITAL_BASED_OUTPATIENT_CLINIC_OR_DEPARTMENT_OTHER): Payer: Self-pay | Admitting: Obstetrics and Gynecology

## 2019-08-24 NOTE — Progress Notes (Signed)
Spoke w/ via phone for pre-op interview--- PT Lab needs dos---  T&S, Istat 8, and EKG          Lab results------ no COVID test ------ 08-29-2019 @ 1005 Arrive at ------- 0530 NPO after ------ [place] Medications to take morning of surgery ----- Lipitor, Linzess, Synthroid, Protonix w/ sips of water Diabetic medication ----- do not take metformin morning of surgery Patient Special Instructions ----- n/a Pre-Op special Istructions ----- n/a Patient verbalized understanding of instructions that were given at this phone interview. Patient denies shortness of breath, chest pain, fever, cough a this phone interview.

## 2019-08-29 ENCOUNTER — Other Ambulatory Visit (HOSPITAL_COMMUNITY)
Admission: RE | Admit: 2019-08-29 | Discharge: 2019-08-29 | Disposition: A | Discharge status: Home / Self Care | Payer: Commercial Managed Care - PPO | Source: Ambulatory Visit | Attending: Obstetrics and Gynecology | Admitting: Obstetrics and Gynecology

## 2019-08-29 DIAGNOSIS — Z20822 Contact with and (suspected) exposure to covid-19: Secondary | ICD-10-CM | POA: Diagnosis not present

## 2019-08-29 DIAGNOSIS — Z01812 Encounter for preprocedural laboratory examination: Secondary | ICD-10-CM | POA: Insufficient documentation

## 2019-08-29 LAB — SARS CORONAVIRUS 2 (TAT 6-24 HRS): SARS Coronavirus 2: NEGATIVE

## 2019-08-31 NOTE — H&P (Addendum)
Melissa Harmon is an 64 y.o. female, [phone], with persistent right sided dermoid cyst for the past year causing pelvic pain.  She wa scheduled for surgery a year ago after being counseled but had to postpone due to having to care for her sick mother. She reports pain is intermittently dull and achy with no radiation. Pt is here for definitive surgical treatment. She has history of c/s delivery History of IBS, hypothyroidism and diabetes - controlled  Pertinent Gynecological History: Menses: post-menopausal Bleeding: none Contraception: none DES exposure: unknown Blood transfusions: none Sexually transmitted diseases: no past history Previous GYN Procedures: none  Last mammogram: normal Date: 06/2018 Last pap: normal Date: 06/2018 OB History: G4, P3013   Menstrual History: Menarche age: 77 No LMP recorded. Patient is postmenopausal.    Past Medical History:  Diagnosis Date  . Carpal tunnel syndrome on both sides   . Fibromyalgia   . History of chronic bronchitis   . History of Hashimoto thyroiditis    s/p  bilateral thryoidectomy 03/ 2001  . Hyperlipidemia   . Hypothyroidism, postsurgical followed by pcp   08-04-1999 s/p  bilateral thyroidectomy for multinodular goitar (per path hashimoto's thyroiditis)  . Irritable bowel syndrome with constipation   . MDD (major depressive disorder)   . Pelvic pain   . Right ovarian cyst   . Type 2 diabetes mellitus ([place])    followed by pcp  (08-24-2019 pt stated checks blood sugar dialy in am,  fasting-- 159-175)  . Wears contact lenses   . Wears dentures    upper    Past Surgical History:  Procedure Laterality Date  . CESAREAN SECTION  1986  . COLONOSCOPY  last one 05-25-2018  . THYROIDECTOMY Bilateral 08-04-1999  dr Bubba Camp  @[facility]    subtotal    Family History  Problem Relation Age of Onset  . Diabetes Mother   . Heart disease Mother   . Stroke Mother   . Bipolar disorder Father   . Schizophrenia Brother     Social History:   reports that she has been smoking cigarettes. She has a 6.25 pack-year smoking history. She has never used smokeless tobacco. She reports current alcohol use of about 6.0 standard drinks of alcohol per week. She reports that she does not use drugs.  Allergies:  Allergies  Allergen Reactions  . Sulfamethoxazole Hives    No medications prior to admission.    Review of Systems  Constitutional: Negative for activity change, appetite change and fatigue.  Eyes: Negative for visual disturbance.  Respiratory: Negative for chest tightness.   Cardiovascular: Negative for chest pain and palpitations.  Gastrointestinal: Positive for abdominal pain. Negative for constipation, diarrhea, nausea and vomiting.  Genitourinary: Positive for pelvic pain. Negative for menstrual problem.  Musculoskeletal: Negative for back pain and myalgias.  Neurological: Negative for seizures, numbness and headaches.  Psychiatric/Behavioral: The patient is not nervous/anxious.     Height 5\' 5"  (1.651 m), weight 70.8 kg. Physical Exam  Constitutional: She is oriented to person, place, and time. She appears well-developed and well-nourished.  Cardiovascular: Intact distal pulses.  Respiratory: Effort normal and breath sounds normal.  GI: Soft.  Genitourinary:    Vagina normal.   Musculoskeletal:        General: Normal range of motion.     Cervical back: Normal range of motion.  Neurological: She is alert and oriented to person, place, and time.  Skin: Skin is warm.  Psychiatric: She has a normal mood and affect. Her behavior is normal.  Judgment and thought content normal.    No results found for this or any previous visit (from the past 24 hour(s)).  No results found.  Assessment/Plan: [id number] female with persistent right sided dermoid cyst here for laparoscopic right oopherectomy/removal of dermoid cyst; possible open - Sars covid neg - NPO per ERAS - SCDs to both legs in OR - Review consent  - To OR  when ready  Venetia Night Mckennah Kretchmer 08/31/2019, 7:42 AM

## 2019-09-01 ENCOUNTER — Encounter (HOSPITAL_BASED_OUTPATIENT_CLINIC_OR_DEPARTMENT_OTHER)
Admission: RE | Disposition: A | Discharge status: Home / Self Care | Payer: Self-pay | Source: Home / Self Care | Attending: Obstetrics and Gynecology

## 2019-09-01 ENCOUNTER — Encounter (HOSPITAL_BASED_OUTPATIENT_CLINIC_OR_DEPARTMENT_OTHER): Payer: Self-pay | Admitting: Obstetrics and Gynecology

## 2019-09-01 ENCOUNTER — Other Ambulatory Visit: Payer: Self-pay

## 2019-09-01 ENCOUNTER — Ambulatory Visit (HOSPITAL_BASED_OUTPATIENT_CLINIC_OR_DEPARTMENT_OTHER): Payer: Commercial Managed Care - PPO | Admitting: Anesthesiology

## 2019-09-01 ENCOUNTER — Ambulatory Visit (HOSPITAL_BASED_OUTPATIENT_CLINIC_OR_DEPARTMENT_OTHER)
Admission: RE | Admit: 2019-09-01 | Discharge: 2019-09-01 | Disposition: A | Discharge status: Home / Self Care | Payer: Commercial Managed Care - PPO | Attending: Obstetrics and Gynecology | Admitting: Obstetrics and Gynecology

## 2019-09-01 DIAGNOSIS — N7011 Chronic salpingitis: Secondary | ICD-10-CM | POA: Insufficient documentation

## 2019-09-01 DIAGNOSIS — E890 Postprocedural hypothyroidism: Secondary | ICD-10-CM | POA: Insufficient documentation

## 2019-09-01 DIAGNOSIS — D270 Benign neoplasm of right ovary: Secondary | ICD-10-CM | POA: Insufficient documentation

## 2019-09-01 DIAGNOSIS — F17210 Nicotine dependence, cigarettes, uncomplicated: Secondary | ICD-10-CM | POA: Insufficient documentation

## 2019-09-01 DIAGNOSIS — N83201 Unspecified ovarian cyst, right side: Secondary | ICD-10-CM | POA: Diagnosis present

## 2019-09-01 DIAGNOSIS — E119 Type 2 diabetes mellitus without complications: Secondary | ICD-10-CM | POA: Diagnosis not present

## 2019-09-01 HISTORY — DX: Presence of dental prosthetic device (complete) (partial): Z97.2

## 2019-09-01 HISTORY — DX: Major depressive disorder, single episode, unspecified: F32.9

## 2019-09-01 HISTORY — PX: LAPAROSCOPIC UNILATERAL SALPINGO OOPHERECTOMY: SHX5935

## 2019-09-01 HISTORY — DX: Irritable bowel syndrome with constipation: K58.1

## 2019-09-01 HISTORY — DX: Hyperlipidemia, unspecified: E78.5

## 2019-09-01 HISTORY — DX: Personal history of other endocrine, nutritional and metabolic disease: Z86.39

## 2019-09-01 HISTORY — DX: Pelvic and perineal pain: R10.2

## 2019-09-01 HISTORY — DX: Carpal tunnel syndrome, bilateral upper limbs: G56.03

## 2019-09-01 HISTORY — DX: Presence of spectacles and contact lenses: Z97.3

## 2019-09-01 HISTORY — DX: Personal history of other diseases of the respiratory system: Z87.09

## 2019-09-01 HISTORY — DX: Postprocedural hypothyroidism: E89.0

## 2019-09-01 HISTORY — DX: Unspecified ovarian cyst, right side: N83.201

## 2019-09-01 HISTORY — DX: Type 2 diabetes mellitus without complications: E11.9

## 2019-09-01 LAB — ABO/RH: ABO/RH(D): O POS

## 2019-09-01 LAB — POCT I-STAT, CHEM 8
BUN: 12 mg/dL (ref 8–23)
Calcium, Ion: 1.18 mmol/L (ref 1.15–1.40)
Chloride: 106 mmol/L (ref 98–111)
Creatinine, Ser: 0.6 mg/dL (ref 0.44–1.00)
Glucose, Bld: 162 mg/dL — ABNORMAL HIGH (ref 70–99)
HCT: 47 % — ABNORMAL HIGH (ref 36.0–46.0)
Hemoglobin: 16 g/dL — ABNORMAL HIGH (ref 12.0–15.0)
Potassium: 3.9 mmol/L (ref 3.5–5.1)
Sodium: 142 mmol/L (ref 135–145)
TCO2: 25 mmol/L (ref 22–32)

## 2019-09-01 LAB — TYPE AND SCREEN
ABO/RH(D): O POS
Antibody Screen: NEGATIVE

## 2019-09-01 LAB — GLUCOSE, CAPILLARY: Glucose-Capillary: 168 mg/dL — ABNORMAL HIGH (ref 70–99)

## 2019-09-01 SURGERY — SALPINGO-OOPHORECTOMY, UNILATERAL, LAPAROSCOPIC
Anesthesia: General | Site: Abdomen | Laterality: Bilateral

## 2019-09-01 MED ORDER — KETOROLAC TROMETHAMINE 30 MG/ML IJ SOLN
INTRAMUSCULAR | Status: DC | PRN
  Administered 2019-09-01: 30 mg via INTRAVENOUS

## 2019-09-01 MED ORDER — FENTANYL CITRATE (PF) 100 MCG/2ML IJ SOLN
25.0000 ug | INTRAMUSCULAR | Status: DC | PRN
  Administered 2019-09-01 (×3): 25 ug via INTRAVENOUS

## 2019-09-01 MED ORDER — IBUPROFEN 600 MG PO TABS: 600.0000 mg | ORAL_TABLET | Freq: Four times a day (QID) | ORAL | 1 refills | Status: DC | PRN

## 2019-09-01 MED ORDER — MIDAZOLAM HCL 2 MG/2ML IJ SOLN
INTRAMUSCULAR | Status: AC
  Filled 2019-09-01: qty 2

## 2019-09-01 MED ORDER — DROPERIDOL 2.5 MG/ML IJ SOLN
INTRAMUSCULAR | Status: DC | PRN
  Administered 2019-09-01: .625 mg via INTRAVENOUS

## 2019-09-01 MED ORDER — FENTANYL CITRATE (PF) 100 MCG/2ML IJ SOLN
INTRAMUSCULAR | Status: DC | PRN
  Administered 2019-09-01 (×2): 25 ug via INTRAVENOUS
  Administered 2019-09-01: 50 ug via INTRAVENOUS

## 2019-09-01 MED ORDER — LACTATED RINGERS IV SOLN: INTRAVENOUS | Status: DC

## 2019-09-01 MED ORDER — PROPOFOL 10 MG/ML IV BOLUS
INTRAVENOUS | Status: AC
  Filled 2019-09-01: qty 40

## 2019-09-01 MED ORDER — ACETAMINOPHEN 500 MG PO TABS
ORAL_TABLET | ORAL | Status: AC
  Filled 2019-09-01: qty 2

## 2019-09-01 MED ORDER — KETOROLAC TROMETHAMINE 30 MG/ML IJ SOLN: 30.0000 mg | Freq: Once | INTRAMUSCULAR | Status: DC

## 2019-09-01 MED ORDER — ACETAMINOPHEN 500 MG PO TABS
1000.0000 mg | ORAL_TABLET | ORAL | Status: AC
  Administered 2019-09-01: 1000 mg via ORAL

## 2019-09-01 MED ORDER — FENTANYL CITRATE (PF) 100 MCG/2ML IJ SOLN
INTRAMUSCULAR | Status: AC
  Filled 2019-09-01: qty 2

## 2019-09-01 MED ORDER — OXYCODONE-ACETAMINOPHEN 5-325 MG PO TABS: 1.0000 | ORAL_TABLET | ORAL | 0 refills | Status: AC | PRN

## 2019-09-01 MED ORDER — ONDANSETRON HCL 4 MG/2ML IJ SOLN
INTRAMUSCULAR | Status: AC
  Filled 2019-09-01: qty 2

## 2019-09-01 MED ORDER — LIDOCAINE 2% (20 MG/ML) 5 ML SYRINGE
INTRAMUSCULAR | Status: DC | PRN
  Administered 2019-09-01: 80 mg via INTRAVENOUS

## 2019-09-01 MED ORDER — OXYCODONE HCL 5 MG PO TABS: 5.0000 mg | ORAL_TABLET | Freq: Once | ORAL | Status: DC | PRN

## 2019-09-01 MED ORDER — LIDOCAINE 2% (20 MG/ML) 5 ML SYRINGE
INTRAMUSCULAR | Status: AC
  Filled 2019-09-01: qty 5

## 2019-09-01 MED ORDER — ROCURONIUM BROMIDE 10 MG/ML (PF) SYRINGE
PREFILLED_SYRINGE | INTRAVENOUS | Status: DC | PRN
  Administered 2019-09-01: 50 mg via INTRAVENOUS

## 2019-09-01 MED ORDER — PHENYLEPHRINE 40 MCG/ML (10ML) SYRINGE FOR IV PUSH (FOR BLOOD PRESSURE SUPPORT)
PREFILLED_SYRINGE | INTRAVENOUS | Status: AC
  Filled 2019-09-01: qty 10

## 2019-09-01 MED ORDER — SODIUM CHLORIDE 0.9 % IR SOLN
Status: DC | PRN
  Administered 2019-09-01: 3000 mL

## 2019-09-01 MED ORDER — KETOROLAC TROMETHAMINE 30 MG/ML IJ SOLN: 30.0000 mg | Freq: Once | INTRAMUSCULAR | Status: DC | PRN

## 2019-09-01 MED ORDER — PROPOFOL 10 MG/ML IV BOLUS
INTRAVENOUS | Status: DC | PRN
  Administered 2019-09-01: 100 mg via INTRAVENOUS

## 2019-09-01 MED ORDER — SUGAMMADEX SODIUM 200 MG/2ML IV SOLN
INTRAVENOUS | Status: DC | PRN
  Administered 2019-09-01: 160 mg via INTRAVENOUS

## 2019-09-01 MED ORDER — MIDAZOLAM HCL 2 MG/2ML IJ SOLN
INTRAMUSCULAR | Status: DC | PRN
  Administered 2019-09-01: 2 mg via INTRAVENOUS

## 2019-09-01 MED ORDER — BUPIVACAINE HCL (PF) 0.25 % IJ SOLN
INTRAMUSCULAR | Status: DC | PRN
  Administered 2019-09-01: 10 mL

## 2019-09-01 MED ORDER — PHENYLEPHRINE 40 MCG/ML (10ML) SYRINGE FOR IV PUSH (FOR BLOOD PRESSURE SUPPORT)
PREFILLED_SYRINGE | INTRAVENOUS | Status: DC | PRN
  Administered 2019-09-01: 80 ug via INTRAVENOUS

## 2019-09-01 MED ORDER — ONDANSETRON HCL 4 MG/2ML IJ SOLN
INTRAMUSCULAR | Status: DC | PRN
  Administered 2019-09-01: 4 mg via INTRAVENOUS

## 2019-09-01 MED ORDER — OXYCODONE HCL 5 MG/5ML PO SOLN: 5.0000 mg | Freq: Once | ORAL | Status: DC | PRN

## 2019-09-01 MED ORDER — ROCURONIUM BROMIDE 10 MG/ML (PF) SYRINGE
PREFILLED_SYRINGE | INTRAVENOUS | Status: AC
  Filled 2019-09-01: qty 10

## 2019-09-01 SURGICAL SUPPLY — 41 items
ADH SKN CLS APL DERMABOND .7 (GAUZE/BANDAGES/DRESSINGS) ×1
BAG SPEC RTRVL LRG 6X4 10 (ENDOMECHANICALS) ×1
CATH ROBINSON RED A/P 16FR (CATHETERS) ×1 IMPLANT
COVER MAYO STAND STRL (DRAPES) ×2 IMPLANT
COVER WAND RF STERILE (DRAPES) ×2 IMPLANT
DERMABOND ADVANCED (GAUZE/BANDAGES/DRESSINGS) ×1
DERMABOND ADVANCED .7 DNX12 (GAUZE/BANDAGES/DRESSINGS) ×1 IMPLANT
DRSG OPSITE POSTOP 3X4 (GAUZE/BANDAGES/DRESSINGS) ×2 IMPLANT
DURAPREP 26ML APPLICATOR (WOUND CARE) ×2 IMPLANT
GAUZE 4X4 16PLY RFD (DISPOSABLE) ×2 IMPLANT
GLOVE BIO SURGEON STRL SZ 6 (GLOVE) ×2 IMPLANT
GLOVE BIO SURGEON STRL SZ 6.5 (GLOVE) ×4 IMPLANT
GLOVE BIO SURGEON STRL SZ7 (GLOVE) ×1 IMPLANT
GLOVE BIOGEL PI IND STRL 6.5 (GLOVE) IMPLANT
GLOVE BIOGEL PI IND STRL 7.0 (GLOVE) IMPLANT
GLOVE BIOGEL PI IND STRL 7.5 (GLOVE) IMPLANT
GLOVE BIOGEL PI INDICATOR 6.5 (GLOVE) ×2
GLOVE BIOGEL PI INDICATOR 7.0 (GLOVE) ×3
GLOVE BIOGEL PI INDICATOR 7.5 (GLOVE) ×1
GOWN STRL REUS W/TWL LRG LVL3 (GOWN DISPOSABLE) ×8 IMPLANT
MANIPULATOR UTERINE 7CM CLEARV (MISCELLANEOUS) ×2 IMPLANT
NS IRRIG 1000ML POUR BTL (IV SOLUTION) ×2 IMPLANT
PACK LAPAROSCOPY BASIN (CUSTOM PROCEDURE TRAY) ×2 IMPLANT
PACK TRENDGUARD 450 HYBRID PRO (MISCELLANEOUS) ×1 IMPLANT
POUCH SPECIMEN RETRIEVAL 10MM (ENDOMECHANICALS) ×1 IMPLANT
PROTECTOR NERVE ULNAR (MISCELLANEOUS) ×4 IMPLANT
SET SUCTION IRRIG HYDROSURG (IRRIGATION / IRRIGATOR) ×1 IMPLANT
SET TUBE SMOKE EVAC HIGH FLOW (TUBING) ×2 IMPLANT
SHEARS HARMONIC ACE PLUS 36CM (ENDOMECHANICALS) ×1 IMPLANT
SLEEVE XCEL OPT CAN 5 100 (ENDOMECHANICALS) ×2 IMPLANT
SOLUTION ELECTROLUBE (MISCELLANEOUS) IMPLANT
SUT MNCRL AB 4-0 PS2 18 (SUTURE) ×1 IMPLANT
SUT VIC AB 3-0 PS2 18 (SUTURE) ×2
SUT VIC AB 3-0 PS2 18XBRD (SUTURE) ×1 IMPLANT
SUT VICRYL 0 UR6 27IN ABS (SUTURE) ×1 IMPLANT
SYSTEM CARTER THOMASON II (TROCAR) ×1 IMPLANT
TOWEL OR 17X26 10 PK STRL BLUE (TOWEL DISPOSABLE) ×4 IMPLANT
TRENDGUARD 450 HYBRID PRO PACK (MISCELLANEOUS) ×2
TROCAR BLADELESS OPT 5 100 (ENDOMECHANICALS) ×4 IMPLANT
TROCAR XCEL NON-BLD 11X100MML (ENDOMECHANICALS) ×1 IMPLANT
WARMER LAPAROSCOPE (MISCELLANEOUS) ×2 IMPLANT

## 2019-09-01 NOTE — Anesthesia Preprocedure Evaluation (Signed)
Anesthesia Evaluation  Patient identified by MRN, date of birth, ID band Patient awake    Reviewed: Allergy & Precautions, NPO status , Patient's Chart, lab work & pertinent test results  Airway Mallampati: II  TM Distance: >3 FB Neck ROM: Full    Dental no notable dental hx.    Pulmonary Current Smoker and Patient abstained from smoking.,    Pulmonary exam normal breath sounds clear to auscultation       Cardiovascular negative cardio ROS Normal cardiovascular exam Rhythm:Regular Rate:Normal     Neuro/Psych negative neurological ROS  negative psych ROS   GI/Hepatic negative GI ROS, Neg liver ROS,   Endo/Other  diabetesHypothyroidism   Renal/GU negative Renal ROS  negative genitourinary   Musculoskeletal negative musculoskeletal ROS (+)   Abdominal   Peds negative pediatric ROS (+)  Hematology negative hematology ROS (+)   Anesthesia Other Findings   Reproductive/Obstetrics negative OB ROS                             Anesthesia Physical Anesthesia Plan  ASA: III  Anesthesia Plan: General   Post-op Pain Management:    Induction: Intravenous  PONV Risk Score and Plan: 3 and Ondansetron, Dexamethasone and Treatment may vary due to age or medical condition  Airway Management Planned: Oral ETT  Additional Equipment:   Intra-op Plan:   Post-operative Plan: Extubation in OR  Informed Consent: I have reviewed the patients History and Physical, chart, labs and discussed the procedure including the risks, benefits and alternatives for the proposed anesthesia with the patient or authorized representative who has indicated his/her understanding and acceptance.     Dental advisory given  Plan Discussed with: CRNA and Surgeon  Anesthesia Plan Comments:         Anesthesia Quick Evaluation

## 2019-09-01 NOTE — Anesthesia Postprocedure Evaluation (Signed)
Anesthesia Post Note  Patient: Melissa Harmon  Procedure(s) Performed: LAPAROSCOPIC BILATERAL SALPINGO OOPHORECTOMY (Bilateral Abdomen)     Patient location during evaluation: PACU Anesthesia Type: General Level of consciousness: awake and alert Pain management: pain level controlled Vital Signs Assessment: post-procedure vital signs reviewed and stable Respiratory status: spontaneous breathing, nonlabored ventilation, respiratory function stable and patient connected to nasal cannula oxygen Cardiovascular status: blood pressure returned to baseline and stable Postop Assessment: no apparent nausea or vomiting Anesthetic complications: no    Last Vitals:  Vitals:   09/01/19 0926 09/01/19 0930  BP:  116/85  Pulse: 64 61  Resp: 19 16  Temp:    SpO2: 99% 100%    Last Pain:  Vitals:   09/01/19 0930  TempSrc:   PainSc: Asleep                 Edlyn Rosenburg S

## 2019-09-01 NOTE — Discharge Instructions (Signed)
Call office with any concerns [phone]  DISCHARGE INSTRUCTIONS: Laparoscopy  The following instructions have been prepared to help you care for yourself upon your return home today.  Wound care: Marland Kitchen Do not get the incision wet for the first 24 hours. The incision should be kept clean and dry. . The Band-Aids or dressings may be removed the day after surgery. . Should the incision become sore, red, and swollen after the first week, check with your doctor.  Personal hygiene: . Shower the day after your procedure.  Activity and limitations: . Do NOT drive or operate any equipment today. . Do NOT lift anything more than 15 pounds for 2-3 weeks after surgery. . Do NOT rest in bed all day. . Walking is encouraged. Walk each day, starting slowly with 5-minute walks 3 or 4 times a day. Slowly increase the length of your walks. . Walk up and down stairs slowly. . Do NOT do strenuous activities, such as golfing, playing tennis, bowling, running, biking, weight lifting, gardening, mowing, or vacuuming for 2-4 weeks. Ask your doctor when it is okay to start.  Diet: Eat a light meal as desired this evening. You may resume your usual diet tomorrow.  Return to work: This is dependent on the type of work you do. For the most part you can return to a desk job within a week of surgery. If you are more active at work, please discuss this with your doctor.  What to expect after your surgery: You may have a slight burning sensation when you urinate on the first day. You may have a very small amount of blood in the urine. Expect to have a small amount of vaginal discharge/light bleeding for 1-2 weeks. It is not unusual to have abdominal soreness and bruising for up to 2 weeks. You may be tired and need more rest for about 1 week. You may experience shoulder pain for 24-72 hours. Lying flat in bed may relieve it.  Call your doctor for any of the following: . Develop a fever of 100.4 or greater .  Inability to urinate 6 hours after discharge from hospital . Severe pain not relieved by pain medications . Persistent of heavy bleeding at incision site . Redness or swelling around incision site after a week . Increasing nausea or vomiting  Patient Signature________________________________________ Nurse Signature_________________________________________    Post Anesthesia Home Care Instructions  Activity: Get plenty of rest for the remainder of the day. A responsible individual must stay with you for 24 hours following the procedure.  For the next 24 hours, DO NOT: -Drive a car -Paediatric nurse -Drink alcoholic beverages -Take any medication unless instructed by your physician -Make any legal decisions or sign important papers.  Meals: Start with liquid foods such as gelatin or soup. Progress to regular foods as tolerated. Avoid greasy, spicy, heavy foods. If nausea and/or vomiting occur, drink only clear liquids until the nausea and/or vomiting subsides. Call your physician if vomiting continues.  Special Instructions/Symptoms: Your throat may feel dry or sore from the anesthesia or the breathing tube placed in your throat during surgery. If this causes discomfort, gargle with warm salt water. The discomfort should disappear within 24 hours.  If you had a scopolamine patch placed behind your ear for the management of post- operative nausea and/or vomiting:  1. The medication in the patch is effective for 72 hours, after which it should be removed.  Wrap patch in a tissue and discard in the trash.  Wash hands thoroughly with soap and water. 2. You may remove the patch earlier than 72 hours if you experience unpleasant side effects which may include dry mouth, dizziness or visual disturbances. 3. Avoid touching the patch. Wash your hands with soap and water after contact with the patch.     NO IBUPROFEN PRODUCTS (MOTRIN, ADVIL) OR ALEVE UNTIL 2;45PM TODAY.

## 2019-09-01 NOTE — Anesthesia Procedure Notes (Signed)
Procedure Name: Intubation Date/Time: 09/01/2019 7:35 AM Performed by: Suan Halter, CRNA Pre-anesthesia Checklist: Patient identified, Emergency Drugs available, Suction available and Patient being monitored Patient Re-evaluated:Patient Re-evaluated prior to induction Oxygen Delivery Method: Circle system utilized Preoxygenation: Pre-oxygenation with 100% oxygen Induction Type: IV induction Ventilation: Mask ventilation without difficulty Laryngoscope Size: Mac and 3 Grade View: Grade I Tube type: Oral Tube size: 7.0 mm Number of attempts: 1 Airway Equipment and Method: Stylet and Oral airway Placement Confirmation: ETT inserted through vocal cords under direct vision,  positive ETCO2 and breath sounds checked- equal and bilateral Secured at: 22 cm Tube secured with: Tape Dental Injury: Teeth and Oropharynx as per pre-operative assessment

## 2019-09-01 NOTE — Interval H&P Note (Signed)
History and Physical Interval Note: Pt seen. No change from H/P Reviewed consent To OR when ready   09/01/2019 7:24 AM  Melissa Harmon  has presented today for surgery, with the diagnosis of teratoma right ovary, pain in pelvis.  The various methods of treatment have been discussed with the patient and family. After consideration of risks, benefits and other options for treatment, the patient has consented to  Procedure(s) with comments: [place] (Right) - possible open as a surgical intervention.  The patient's history has been reviewed, patient examined, no change in status, stable for surgery.  I have reviewed the patient's chart and labs.  Questions were answered to the patient's satisfaction.     Isaiah Serge

## 2019-09-01 NOTE — Op Note (Signed)
Operative Note    Preoperative Diagnosis: Right ovarian dermoid appearing cyst                                             Right sided pelvic pain   Postoperative Diagnosis: Same                                               Adherent left ovary and tube   Procedure: Laproscopic bilateral salpingo-oopherectomy; lysis of adhesions  Surgeon: Mat Carne, S MD  Anesthesia: General  Fluids: LR 1L EBL: <80ml UOP: 64ml   Findings: Large 5-6cm right ovarian cyst with hair and fat contents, left tube and ovary adherent to lateral sidewall   Specimen: bilateral tubes and ovaries to pathology   Procedure Note  Consent reviewed and confirmed in periop area. Patient was taken to the operating room where general anesthesia was administered without difficulty. She was then prepped and draped in the normal sterile fashion and placed in the dorsal lithotomy position. An appropriate timeout was performed. A speculum was then placed within the vagina and a ClearVue uterine manipulator tenaculum. The bladder was emptied with a red rubber. Attention was then turned to the patient's abdomen after draping. A 5 mm infraumbilical incision was made and the optiview trocar and camera easily introduced into the abdominal cavity.Gas flow was then applied and a pneumoperitoneum obtained with approximate 3 L of CO2 gas.Pt was placed in trendelenberg and a large right ovarian cyst noted; the fallopian tube was mildly adherent and wrapped around it. A second 6mm trocar was placed in the patients right lower uterine segment under visualization and a better survey done.  With patient in Trendelenburg the uterus and tubes and ovaries were inspected with findings confirmed as previously stated; her left ovary was noted with a speckled appearance and adherent to the lateral sidewall and the fallopian tube. At this time a third port, 32mm, was placed in the left lower uterine segment under direct  visualization. The harmonic scalpel was then used to excise the right ovary and tube. Great hemostasis noted. An endocatch bag was then introduced and the tube/ovary/cyst placed in the bag and removed. The left ovary was once again inspected and given appearance, decision made to remove out of concern for continued pelvic pain. The harmonic scalpel was once again used to hemostatically excise both. There was a small amount of bleeding that was effectively cauterized. The tube and ovary were removed. Residual adhesions were ligated. Copious irrigation was performed. The pedicles were inspected with decreased Co 2 gas and confirmed to continue to be hemostatic. The 63mm post site was closed using a carter thomasen under visualization. The remaining instruments were removed  from the abdomen also under visualization and the gas reduced.Incisions were closed with 4-0 vicryl.  Dermabond was applied to the incisions.  Patient was then awakened and taken to the recovery room in good condition. Counts were all confirmed.

## 2019-09-01 NOTE — Transfer of Care (Signed)
Immediate Anesthesia Transfer of Care Note  Patient: Melissa Harmon  Procedure(s) Performed: Procedure(s) (LRB): LAPAROSCOPIC BILATERAL SALPINGO OOPHORECTOMY (Bilateral)  Patient Location: PACU  Anesthesia Type: General  Level of Consciousness: awake, oriented, sedated and patient cooperative  Airway & Oxygen Therapy: Patient Spontanous Breathing and Patient connected to face mask oxygen  Post-op Assessment: Report given to PACU RN and Post -op Vital signs reviewed and stable  Post vital signs: Reviewed and stable  Complications: No apparent anesthesia complications Last Vitals:  Vitals Value Taken Time  BP    Temp    Pulse    Resp    SpO2      Last Pain:  Vitals:   09/01/19 0629  TempSrc: Oral  PainSc: 0-No pain      Patients Stated Pain Goal: 4 (09/01/19 0629)

## 2019-09-04 LAB — SURGICAL PATHOLOGY

## 2019-09-12 ENCOUNTER — Other Ambulatory Visit: Payer: Self-pay | Admitting: Internal Medicine

## 2019-09-12 DIAGNOSIS — Z1231 Encounter for screening mammogram for malignant neoplasm of breast: Secondary | ICD-10-CM

## 2019-09-23 ENCOUNTER — Other Ambulatory Visit: Payer: Self-pay | Admitting: Gastroenterology

## 2019-11-07 ENCOUNTER — Other Ambulatory Visit: Payer: Self-pay | Admitting: Gastroenterology

## 2019-12-10 ENCOUNTER — Other Ambulatory Visit: Payer: Self-pay | Admitting: Gastroenterology

## 2020-03-07 ENCOUNTER — Other Ambulatory Visit: Payer: Self-pay | Admitting: Gastroenterology

## 2020-03-12 ENCOUNTER — Other Ambulatory Visit: Payer: Self-pay

## 2020-03-12 ENCOUNTER — Ambulatory Visit (AMBULATORY_SURGERY_CENTER): Payer: Self-pay | Admitting: *Deleted

## 2020-03-12 VITALS — Ht 65.0 in | Wt 163.0 lb

## 2020-03-12 DIAGNOSIS — Z86010 Personal history of colonic polyps: Secondary | ICD-10-CM

## 2020-03-12 MED ORDER — NA SULFATE-K SULFATE-MG SULF 17.5-3.13-1.6 GM/177ML PO SOLN: 1.0000 | Freq: Once | ORAL | 0 refills | Status: AC

## 2020-03-12 NOTE — Progress Notes (Signed)

## 2020-03-14 ENCOUNTER — Encounter: Payer: Self-pay | Admitting: Gastroenterology

## 2020-03-26 ENCOUNTER — Other Ambulatory Visit: Payer: Self-pay

## 2020-03-26 ENCOUNTER — Ambulatory Visit (AMBULATORY_SURGERY_CENTER): Payer: Commercial Managed Care - PPO | Admitting: Gastroenterology

## 2020-03-26 ENCOUNTER — Encounter: Payer: Self-pay | Admitting: Gastroenterology

## 2020-03-26 VITALS — BP 105/81 | HR 66 | Temp 97.8°F | Resp 17 | Ht 65.0 in | Wt 163.0 lb

## 2020-03-26 DIAGNOSIS — D123 Benign neoplasm of transverse colon: Secondary | ICD-10-CM | POA: Diagnosis not present

## 2020-03-26 DIAGNOSIS — D125 Benign neoplasm of sigmoid colon: Secondary | ICD-10-CM

## 2020-03-26 DIAGNOSIS — D122 Benign neoplasm of ascending colon: Secondary | ICD-10-CM

## 2020-03-26 DIAGNOSIS — D124 Benign neoplasm of descending colon: Secondary | ICD-10-CM | POA: Diagnosis not present

## 2020-03-26 DIAGNOSIS — D128 Benign neoplasm of rectum: Secondary | ICD-10-CM

## 2020-03-26 DIAGNOSIS — Z86010 Personal history of colonic polyps: Secondary | ICD-10-CM

## 2020-03-26 MED ORDER — SODIUM CHLORIDE 0.9 % IV SOLN: 500.0000 mL | Freq: Once | INTRAVENOUS | Status: AC

## 2020-03-26 NOTE — Progress Notes (Signed)
Report given to PACU, vss 

## 2020-03-26 NOTE — Patient Instructions (Addendum)
HANDOUTS PROVIDED ON: Polyps, Diverticulosis, Hemorrhoids  The polyps removed taken today have been sent for pathology.  The results can take 1-3 weeks to receive.  When your next colonoscopy should occur will be based on the pathology results.    You may resume your previous diet and medication schedule.  Repeat colonoscopy in 1 year for surveillance based on pathology results.  Refer to a genetics counselor at appointment to be scheduled.  Use small pea size amount of Recticare and Preparation H per rectum as needed twice daily X 7 days  Thank you for allowing [place] to care for you today!!!     YOU HAD AN ENDOSCOPIC PROCEDURE TODAY AT [facility]:   Refer to the procedure report that was given to you for any specific questions about what was found during the examination.  If the procedure report does not answer your questions, please call your gastroenterologist to clarify.  If you requested that your care partner not be given the details of your procedure findings, then the procedure report has been included in a sealed envelope for you to review at your convenience later.  YOU SHOULD EXPECT: Some feelings of bloating in the abdomen. Passage of more gas than usual.  Walking can help get rid of the air that was put into your GI tract during the procedure and reduce the bloating. If you had a lower endoscopy (such as a colonoscopy or flexible sigmoidoscopy) you may notice spotting of blood in your stool or on the toilet paper. If you underwent a bowel prep for your procedure, you may not have a normal bowel movement for a few days.  Please Note:  You might notice some irritation and congestion in your nose or some drainage.  This is from the oxygen used during your procedure.  There is no need for concern and it should clear up in a day or so.  SYMPTOMS TO REPORT IMMEDIATELY:   Following lower endoscopy (colonoscopy or flexible sigmoidoscopy):  Excessive amounts of blood in  the stool  Significant tenderness or worsening of abdominal pains  Swelling of the abdomen that is new, acute  Fever of 100F or higher  For urgent or emergent issues, a gastroenterologist can be reached at any hour by calling [phone]. Do not use MyChart messaging for urgent concerns.    DIET:  We do recommend a small meal at first, but then you may proceed to your regular diet.  Drink plenty of fluids but you should avoid alcoholic beverages for 24 hours.  ACTIVITY:  You should plan to take it easy for the rest of today and you should NOT DRIVE or use heavy machinery until tomorrow (because of the sedation medicines used during the test).    FOLLOW UP: Our staff will call the number listed on your records 48-72 hours following your procedure to check on you and address any questions or concerns that you may have regarding the information given to you following your procedure. If we do not reach you, we will leave a message.  We will attempt to reach you two times.  During this call, we will ask if you have developed any symptoms of COVID 19. If you develop any symptoms (ie: fever, flu-like symptoms, shortness of breath, cough etc.) before then, please call [phone].  If you test positive for Covid 19 in the 2 weeks post procedure, please call and report this information to [place].    If any biopsies were taken you  will be contacted by phone or by letter within the next 1-3 weeks.  Please call us at [phone] if you have not heard about the biopsies in 3 weeks.    SIGNATURES/CONFIDENTIALITY: You and/or your care partner have signed paperwork which will be entered into your electronic medical record.  These signatures attest to the fact that that the information above on your After Visit Summary has been reviewed and is understood.  Full responsibility of the confidentiality of this discharge information lies with you and/or your care-partner.

## 2020-03-26 NOTE — Op Note (Addendum)
[place] Patient Name: Melissa Harmon Procedure Date: 03/26/2020 8:33 AM MRN: [id number] Endoscopist: Mauri Pole , MD Age: 64 Referring MD:  Date of Birth: 08/26/1955 Gender: Female Account #: [id number] Procedure:                Colonoscopy Indications:              High risk colon cancer surveillance: Personal                            history of colonic polyps, High risk colon cancer                            surveillance: Personal history of adenoma (10 mm or                            greater in size), High risk colon cancer                            surveillance: Personal history of multiple (3 or                            more) adenomas Medicines:                Monitored Anesthesia Care Procedure:                Pre-Anesthesia Assessment:                           - Prior to the procedure, a History and Physical                            was performed, and patient medications and                            allergies were reviewed. The patient's tolerance of                            previous anesthesia was also reviewed. The risks                            and benefits of the procedure and the sedation                            options and risks were discussed with the patient.                            All questions were answered, and informed consent                            was obtained. Prior Anticoagulants: The patient has                            taken no previous anticoagulant or antiplatelet  agents. ASA Grade Assessment: II - A patient with                            mild systemic disease. After reviewing the risks                            and benefits, the patient was deemed in                            satisfactory condition to undergo the procedure.                           After obtaining informed consent, the colonoscope                            was passed under direct vision. Throughout the                             procedure, the patient's blood pressure, pulse, and                            oxygen saturations were monitored continuously. The                            Colonoscope was introduced through the anus and                            advanced to the the cecum, identified by                            appendiceal orifice and ileocecal valve. The                            colonoscopy was performed without difficulty. The                            patient tolerated the procedure well. The quality                            of the bowel preparation was excellent. The                            ileocecal valve, appendiceal orifice, and rectum                            were photographed. Scope In: 8:40:54 AM Scope Out: 9:08:19 AM Scope Withdrawal Time: 0 hours 22 minutes 49 seconds  Total Procedure Duration: 0 hours 27 minutes 25 seconds  Findings:                 The perianal and digital rectal examinations were                            normal.  Two sessile polyps were found in the transverse                            colon and ascending colon. The polyps were 1 to 2                            mm in size. These polyps were removed with a cold                            biopsy forceps. Resection and retrieval were                            complete.                           Three semi-pedunculated polyps were found in the                            transverse colon and ascending colon. The polyps                            were 11 to 18 mm in size. These polyps were removed                            with a hot snare. Resection and retrieval were                            complete.                           Nine sessile polyps were found in the rectum,                            sigmoid colon, descending colon, transverse colon                            and ascending colon. The polyps were 5 to 9 mm in                            size. These  polyps were removed with a cold snare.                            Resection and retrieval were complete.                           A few small-mouthed diverticula were found in the                            sigmoid colon.                           Non-bleeding external and internal hemorrhoids were  found during retroflexion. The hemorrhoids were                            small. Complications:            No immediate complications. Estimated Blood Loss:     Estimated blood loss was minimal. Impression:               - Two 1 to 2 mm polyps in the transverse colon and                            in the ascending colon, removed with a cold biopsy                            forceps. Resected and retrieved.                           - Three 11 to 18 mm polyps in the transverse colon                            and in the ascending colon, removed with a hot                            snare. Resected and retrieved.                           - Nine 5 to 9 mm polyps in the rectum, in the                            sigmoid colon, in the descending colon, in the                            transverse colon and in the ascending colon,                            removed with a cold snare. Resected and retrieved.                           - Diverticulosis in the sigmoid colon.                           - Non-bleeding external and internal hemorrhoids. Recommendation:           - Patient has a contact number available for                            emergencies. The signs and symptoms of potential                            delayed complications were discussed with the                            patient. Return to normal activities tomorrow.  Written discharge instructions were provided to the                            patient.                           - Resume previous diet.                           - Continue present medications.                            - Await pathology results.                           - Repeat colonoscopy in 1 year for surveillance                            based on pathology results.                           - Refer to a genetics counselor at appointment to                            be scheduled.                           - Use small pea size amount of Recticare and                            Preparation H per rectum as needed twice daily X 7                            days Mauri Pole, MD 03/26/2020 9:16:50 AM This report has been signed electronically.

## 2020-03-26 NOTE — Progress Notes (Signed)
Called to room to assist during endoscopic procedure.  Patient ID and intended procedure confirmed with present staff. Received instructions for my participation in the procedure from the performing physician.  

## 2020-03-26 NOTE — Progress Notes (Signed)
JB - Check-in  CW - VS  Pt's states no medical or surgical changes since previsit or office visit. 

## 2020-03-28 ENCOUNTER — Telehealth: Payer: Self-pay

## 2020-03-28 NOTE — Telephone Encounter (Signed)
  Follow up Call-  Call back number 03/26/2020 05/25/2018  Post procedure Call Back phone  # [phone] cell [phone]  Permission to leave phone message Yes Yes  Some recent data might be hidden     Patient questions:  Do you have a fever, pain , or abdominal swelling? No. Pain Score  0 *  Have you tolerated food without any problems? Yes.    Have you been able to return to your normal activities? Yes.    Do you have any questions about your discharge instructions: Diet   No. Medications  No. Follow up visit  No.  Do you have questions or concerns about your Care? No.  Actions: * If pain score is 4 or above: No action needed, pain <4.  1. Have you developed a fever since your procedure? no  2.   Have you had an respiratory symptoms (SOB or cough) since your procedure? no  3.   Have you tested positive for COVID 19 since your procedure no  4.   Have you had any family members/close contacts diagnosed with the COVID 19 since your procedure?  no   If yes to any of these questions please route to Joylene John, RN and Joella Prince, RN

## 2020-04-01 ENCOUNTER — Other Ambulatory Visit: Payer: Self-pay

## 2020-04-01 DIAGNOSIS — K635 Polyp of colon: Secondary | ICD-10-CM

## 2020-04-15 ENCOUNTER — Encounter: Payer: Self-pay | Admitting: Gastroenterology

## 2020-04-15 ENCOUNTER — Telehealth: Payer: Self-pay | Admitting: Genetic Counselor

## 2020-04-15 NOTE — Telephone Encounter (Signed)
Received a genetic counseling referral from [facility] for Polyposis of colon. Melissa Harmon has been scheduled to see Raquel Sarna on 12/30 at 11am. Pt aware to arrive 15 minutes early.

## 2020-04-22 ENCOUNTER — Telehealth: Payer: Self-pay | Admitting: *Deleted

## 2020-04-22 MED ORDER — LINACLOTIDE 145 MCG PO CAPS: ORAL_CAPSULE | ORAL | 3 refills | Status: DC

## 2020-04-22 NOTE — Telephone Encounter (Signed)
Refilled [facility] for patient per pharmacy fax request

## 2020-04-23 ENCOUNTER — Other Ambulatory Visit: Payer: Self-pay | Admitting: Gastroenterology

## 2020-05-09 ENCOUNTER — Inpatient Hospital Stay: Payer: Commercial Managed Care - PPO

## 2020-05-09 ENCOUNTER — Encounter: Payer: Self-pay | Admitting: Genetic Counselor

## 2020-05-09 ENCOUNTER — Other Ambulatory Visit: Payer: Self-pay

## 2020-05-09 ENCOUNTER — Inpatient Hospital Stay: Payer: Commercial Managed Care - PPO | Attending: Genetic Counselor | Admitting: Genetic Counselor

## 2020-05-09 DIAGNOSIS — Z8371 Family history of colonic polyps: Secondary | ICD-10-CM

## 2020-05-09 DIAGNOSIS — Z8041 Family history of malignant neoplasm of ovary: Secondary | ICD-10-CM | POA: Insufficient documentation

## 2020-05-09 DIAGNOSIS — Z86010 Personal history of colonic polyps: Secondary | ICD-10-CM

## 2020-05-09 DIAGNOSIS — Z803 Family history of malignant neoplasm of breast: Secondary | ICD-10-CM | POA: Insufficient documentation

## 2020-05-09 DIAGNOSIS — Z800 Family history of malignant neoplasm of digestive organs: Secondary | ICD-10-CM

## 2020-05-09 DIAGNOSIS — Z801 Family history of malignant neoplasm of trachea, bronchus and lung: Secondary | ICD-10-CM

## 2020-05-09 LAB — GENETIC SCREENING ORDER

## 2020-05-09 NOTE — Progress Notes (Signed)
REFERRING PROVIDER: Mauri Pole, MD [place],  [place] [id number]  PRIMARY PROVIDER:  Leeroy Cha, MD  PRIMARY REASON FOR VISIT:  1. COLONIC POLYPS, HX OF   2. Family history of colonic polyps   3. Family history of ovarian cancer   4. Family history of stomach cancer   5. Family history of lung cancer   6. Family history of breast cancer      HISTORY OF PRESENT ILLNESS:   Melissa Harmon, a 64 y.o. female, was seen for a [place] cancer genetics consultation at the request of Dr. Silverio Decamp due to a personal and family history of colon polyps.  Melissa Harmon presents to clinic today to discuss the possibility of a hereditary predisposition to colon polyps and/or cancer, genetic testing, and to further clarify her future polyp/cancer risks, as well as potential polyp/cancer risks for family members.   Melissa Harmon does not have a personal history of cancer. She does have a history of at least 21 adenomatous colon polyps, cumulatively.  RISK FACTORS:  Menarche was at age 20-16.  First live birth at age 84.  OCP use for at least 1 year.  Ovaries intact: no.  Hysterectomy: no.  Menopausal status: postmenopausal.  HRT use: 0 years. Colonoscopy: yes; 03/26/20 - adenomatous polyps. Mammogram within the last year: yes. Number of breast biopsies: 1. Any excessive radiation exposure in the past: no.   Past Medical History:  Diagnosis Date  . Bronchitis   . Carpal tunnel syndrome on both sides   . Family history of breast cancer   . Family history of colonic polyps   . Family history of lung cancer   . Family history of ovarian cancer   . Family history of stomach cancer   . Fibromyalgia   . GERD (gastroesophageal reflux disease)   . History of chronic bronchitis   . History of Hashimoto thyroiditis    s/p  bilateral thryoidectomy 03/ 2001  . Hyperlipidemia   . Hypothyroidism, postsurgical followed by pcp   08-04-1999 s/p  bilateral thyroidectomy for  multinodular goitar (per path hashimoto's thyroiditis)  . Irritable bowel syndrome with constipation   . MDD (major depressive disorder)   . Pelvic pain   . Right ovarian cyst   . Type 2 diabetes mellitus ([place])    followed by pcp  (08-24-2019 pt stated checks blood sugar dialy in am,  fasting-- 159-175)  . Wears contact lenses   . Wears dentures    upper    Past Surgical History:  Procedure Laterality Date  . CESAREAN SECTION  1986  . COLONOSCOPY  last one 05-25-2018  . LAPAROSCOPIC UNILATERAL SALPINGO OOPHERECTOMY Bilateral 09/01/2019   Procedure: LAPAROSCOPIC BILATERAL SALPINGO OOPHORECTOMY;  Surgeon: Sherlyn Hay, DO;  Location: [facility];  Service: Gynecology;  Laterality: Bilateral;  . THYROIDECTOMY Bilateral 08-04-1999  dr Bubba Camp  @[facility]    subtotal  . UPPER GASTROINTESTINAL ENDOSCOPY      Social History   Socioeconomic History  . Marital status: Single    Spouse name: Not on file  . Number of children: Not on file  . Years of education: Not on file  . Highest education level: Not on file  Occupational History  . Not on file  Tobacco Use  . Smoking status: Current Every Day Smoker    Packs/day: 0.25    Years: 25.00    Pack years: 6.25    Types: Cigarettes  . Smokeless tobacco: Never Used  . Tobacco  comment: 6 cig per day  Vaping Use  . Vaping Use: Never used  Substance and Sexual Activity  . Alcohol use: Yes    Alcohol/week: 6.0 standard drinks    Types: 6 Cans of beer per week  . Drug use: Never  . Sexual activity: Not on file  Other Topics Concern  . 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  Significant diagnoses are listed below: Family History  Problem Relation Age of Onset  . Diabetes Mother   . Heart disease Mother   . Stroke Mother   . Bipolar disorder Father   . Colon polyps Father        unknown number  . Schizophrenia Brother   . Ovarian cancer Sister 63       or other gynecologic cancer  . Diverticulitis Maternal Aunt   . Stomach cancer Maternal Grandmother        dx ?  Marland Kitchen Heart attack Maternal Grandmother   . Colon polyps Other 25       >80 polyps (6th degree relative - mother's cousin's grandson)  . Heart disease Brother   . Heart disease Half-Brother   . Lung cancer Cousin        dx 25s (maternal first cousin)  . Breast cancer Cousin        dx 60s/70s (maternal first cousin)  . Esophageal cancer Neg Hx   . Rectal cancer Neg Hx    Melissa Harmon has one son (age 71) and two daughters (ages 41 and 46), none of whom have had a colonoscopy. She has one maternal half-brother who has had colonoscopy without polyps. She had two full-brothers and one full-sister. She does not know if these relatives have had colon polyps. Her sister was diagnosed with a gynecologic cancer, possibly ovarian cancer, at age 31.  Melissa Harmon mother is 12 and has not had cancer. She has had <10 colon polyps and diverticulitis on colonoscopy. Melissa Harmon had 8 maternal aunts and two maternal uncles. One maternal first cousin died from lung cancer in her 37s, and another had breast cancer diagnosed in her 65s or 35s. Melissa Harmon maternal grandmother died in her 25s and had a history of stomach cancer (unknown age of diagnosis). Her maternal grandfather died in his 21s without  cancer. She also notes a distant sixth-degree relative (her mother's cousin's grandson) who had more than 66 colon polyps in his 59s.   Melissa Harmon father died at age 26 and had colon polyps (unknown number). She had four paternal aunts and three paternal uncles. Her paternal grandmother died in her 36s, and she does not have information about her paternal grandfather. She does not know of colon polyps in any  paternal relatives other than her father.  Melissa Harmon is unaware of previous family history of genetic testing for hereditary cancer risks. Patient's maternal ancestors are of Black/African American and Native American descent, and paternal ancestors are of Black/African American and [place] descent. There is no reported Ashkenazi Jewish ancestry. There is no known consanguinity.  GENETIC COUNSELING ASSESSMENT: Melissa Harmon is a 64 y.o. female with a personal history of >21 adenomatous colon polyps, as well as a family history of colon polyps, ovarian cancer, breast cancer, and stomach cancer which is somewhat suggestive of a hereditary cancer syndrome and predisposition to cancer. We, therefore, discussed and recommended the following at today's visit.   DISCUSSION:  We discussed that polyps in general are common, however, most people have fewer than 5 lifetime polyps. When an individual has 10 or more polyps we become concerned about an underlying polyposis syndrome. The most common hereditary polyposis syndromes are Familial Adenomatous Polyposis (FAP), caused by mutations in the APC gene, and MUTYH-Associated Polyposis (MAP), caused by mutations in the MUTYH gene. There are other genes that are associated with polyposis, such as NTHL1 and MSH3. We discussed that testing is beneficial for several reasons, including knowing about cancer risks, identifying potential screening and risk-reduction options that may be appropriate, and to understand if other family members could be at risk for colon polyps  and/or cancer and allow them to undergo genetic testing.  We reviewed the characteristics, features and inheritance patterns of hereditary polyposis/cancer syndromes. We also discussed genetic testing, including the appropriate family members to test, the process of testing, insurance coverage and turn-around-time for results. We discussed the implications of a negative, positive and/or variant of uncertain significant result. We recommended Melissa Harmon pursue genetic testing for the [facility] CancerNext-Expanded + RNAinsight panel.  The CancerNext-Expanded + RNAinsight gene panel offered by [facility] and includes sequencing and rearrangement analysis for the following 77 genes:  AIP, ALK, APC, ATM, AXIN2, BAP1, BARD1, BLM, BMPR1A, BRCA1, BRCA2, BRIP1, CDC73, CDH1, CDK4, CDKN1B, CDKN2A, CHEK2, CTNNA1, DICER1, FANCC, FH, FLCN, GALNT12, KIF1B, LZTR1, MAX, MEN1, MET, MLH1, MSH2, MSH3, MSH6, MUTYH, NBN, NF1, NF2, NTHL1, PALB2, PHOX2B, PMS2, POT1, PRKAR1A, PTCH1, PTEN, RAD51C, RAD51D, RB1, RECQL, RET, SDHA, SDHAF2, SDHB, SDHC, SDHD, SMAD4, SMARCA4, SMARCB1, SMARCE1, STK11, SUFU, TMEM127, TP53, TSC1, TSC2, VHL and XRCC2 (sequencing and deletion/duplication); EGFR, EGLN1, HOXB13, KIT, MITF, PDGFRA, POLD1 and POLE (sequencing only); EPCAM and GREM1 (deletion/duplication only). RNA data is routinely analyzed for use in variant interpretation for all genes.   Based on Melissa Harmon personal history of colon polyps and family history of cancer, she meets medical criteria for genetic testing. Despite that she meets criteria, she may still have an out of pocket cost. We discussed that if her out of pocket cost for testing is over $100, the laboratory will reach out to let her know. If the out of pocket cost of testing is less than $100 she will be billed by the genetic testing laboratory.   PLAN: After considering the risks, benefits, and limitations, Melissa Harmon provided informed consent to pursue genetic testing and the  blood sample was sent to [facility] for analysis of the CancerNext-Expanded + RNAinsight panel. Results should be available within approximately two-three weeks' time, at which point they will be disclosed by telephone to Melissa Harmon, as will any additional recommendations warranted by these results. Melissa Harmon will receive a summary of her genetic counseling visit and a copy of her results once available. This information will also  be available in Epic.   Melissa Harmon questions were answered to her satisfaction today. Our contact information was provided should additional questions or concerns arise. Thank you for the referral and allowing [place] to share in the care of your patient.   Clint Guy, [place], [facility] Licensed, Certified Dispensing [email or web address] Phone: [phone]  The patient was seen for a total of 40 minutes in face-to-face genetic counseling.  This patient was discussed with Drs. Magrinat, Lindi Adie and/or Burr Medico who agrees with the above.    _______________________________________________________________________ For Office Staff:  Number of people involved in session: 1 Was an Intern/ student involved with case: no

## 2020-05-22 DIAGNOSIS — Z1379 Encounter for other screening for genetic and chromosomal anomalies: Secondary | ICD-10-CM | POA: Insufficient documentation

## 2020-05-23 ENCOUNTER — Encounter: Payer: Self-pay | Admitting: Genetic Counselor

## 2020-05-23 ENCOUNTER — Ambulatory Visit: Payer: Self-pay | Admitting: Genetic Counselor

## 2020-05-23 ENCOUNTER — Telehealth: Payer: Self-pay | Admitting: Genetic Counselor

## 2020-05-23 DIAGNOSIS — Z1379 Encounter for other screening for genetic and chromosomal anomalies: Secondary | ICD-10-CM

## 2020-05-23 NOTE — Telephone Encounter (Signed)
Revealed negative genetic testing. Discussed that we do not know why she has had multiple colon polyps or why there is cancer in the family. There could be a genetic mutation in the family that Melissa Harmon did not inherit. There could also be a mutation in a different gene that we are not testing, or our current technology may not be able detect certain mutations. It will therefore be important for her to stay in contact with genetics to keep up with whether additional testing may be appropriate in the future.

## 2020-05-23 NOTE — Progress Notes (Signed)
HPI:  Ms. Salemi was previously seen in the [facility] clinic due to a personal history of colon polyps and concerns regarding a hereditary predisposition to cancer. Please refer to our prior cancer genetics clinic note for more information regarding our discussion, assessment and recommendations, at the time. Ms. Canche recent genetic test results were disclosed to her, as were recommendations warranted by these results. These results and recommendations are discussed in more detail below.  FAMILY HISTORY:  We obtained a detailed, 4-generation family history.  Significant diagnoses are listed below: Family History  Problem Relation Age of Onset  . Diabetes Mother   . Heart disease Mother   . Stroke Mother   . Bipolar disorder Father   . Colon polyps Father        unknown number  . Schizophrenia Brother   . Ovarian cancer Sister 38       or other gynecologic cancer  . Diverticulitis Maternal Aunt   . Stomach cancer Maternal Grandmother        dx ?  Marland Kitchen Heart attack Maternal Grandmother   . Colon polyps Other 25       >80 polyps (6th degree relative - mother's cousin's grandson)  . Heart disease Brother   . Heart disease Half-Brother   . Lung cancer Cousin        dx 69s (maternal first cousin)  . Breast cancer Cousin        dx 60s/70s (maternal first cousin)  . Esophageal cancer Neg Hx   . Rectal cancer Neg Hx     Ms. Suen has one son (age 73) and two daughters (ages 75 and 94), none of whom have had a colonoscopy. She has one maternal half-brother who has had colonoscopy without polyps. She had two full-brothers and one full-sister. She does not know if these relatives have had colon polyps. Her sister was diagnosed with a gynecologic cancer, possibly ovarian cancer, at age 4.  Ms. Karn's mother is 6 and has not had cancer. She has had <10 colon polyps and diverticulitis on colonoscopy. Ms. Holcomb had 8 maternal aunts and two maternal uncles. One maternal first  cousin died from lung cancer in her 102s, and another had breast cancer diagnosed in her 9s or 53s. Ms. Allensworth maternal grandmother died in her 66s and had a history of stomach cancer (unknown age of diagnosis). Her maternal grandfather died in his 25s without cancer. She also notes a distant sixth-degree relative (her mother's cousin's grandson) who had more than 54 colon polyps in his 30s.   Ms. Bernick father died at age 18 and had colon polyps (unknown number). She had four paternal aunts and three paternal uncles. Her paternal grandmother died in her 27s, and she does not have information about her paternal grandfather. She does not know of colon polyps in any paternal relatives other than her father.  Ms. Lowe is unaware of previous family history of genetic testing for hereditary cancer risks. Patient's maternal ancestors are of Black/African American and Native American descent, and paternal ancestors are of Black/African American and [place] descent. There is no reported Ashkenazi Jewish ancestry. There is no known consanguinity.  GENETIC TEST RESULTS: Genetic testing reported out on 05/22/2020 through the [facility] + RNAinsight panel. No pathogenic variants were detected.   The CancerNext-Expanded + RNAinsight gene panel offered by [employer] and includes sequencing and rearrangement analysis for the following 77 genes: AIP, ALK, APC, ATM, AXIN2, BAP1, BARD1, BLM, BMPR1A, BRCA1, BRCA2,  BRIP1, CDC73, CDH1, CDK4, CDKN1B, CDKN2A, CHEK2, CTNNA1, DICER1, FANCC, FH, FLCN, GALNT12, KIF1B, LZTR1, MAX, MEN1, MET, MLH1, MSH2, MSH3, MSH6, MUTYH, NBN, NF1, NF2, NTHL1, PALB2, PHOX2B, PMS2, POT1, PRKAR1A, PTCH1, PTEN, RAD51C, RAD51D, RB1, RECQL, RET, SDHA, SDHAF2, SDHB, SDHC, SDHD, SMAD4, SMARCA4, SMARCB1, SMARCE1, STK11, SUFU, TMEM127, TP53, TSC1, TSC2, VHL and XRCC2 (sequencing and deletion/duplication); EGFR, EGLN1, HOXB13, KIT, MITF, PDGFRA, POLD1 and POLE (sequencing only); EPCAM and  GREM1 (deletion/duplication only). RNA data is routinely analyzed for use in variant interpretation for all genes. The test report will be scanned into EPIC and located under the Molecular Pathology section of the Results Review tab.  A portion of the result report is included below for reference.     We discussed with Ms. Selman that because current genetic testing is not perfect, it is possible there may be a gene mutation in one of these genes that current testing cannot detect, but that chance is small.  We also discussed that there could be another gene that has not yet been discovered, or that we have not yet tested, that is responsible for the cancer diagnoses in the family. It is also possible there is a hereditary cause for the cancer in the family that Ms. Widdowson did not inherit and therefore was not identified in her testing.  Therefore, it is important to remain in touch with cancer genetics in the future so that we can continue to offer Ms. Enfield the most up to date genetic testing.   CANCER SCREENING RECOMMENDATIONS: Ms. Dardis test result is considered negative (normal).  This means that we have not identified a hereditary cause for her personal history of colon polyps or family history of cancer at this time. While reassuring, this does not definitively rule out a hereditary predisposition to colon polyps and/or cancer. It is still possible that there could be genetic mutations that are undetectable by current technology. There could be genetic mutations in genes that have not been tested or identified to increase cancer risk.  Therefore, it is recommended she continue to follow the cancer management and screening guidelines provided by her GI and primary healthcare provider.   An individual's cancer risk and medical management are not determined by genetic test results alone. Overall cancer risk assessment incorporates additional factors, including personal medical history, family history,  and any available genetic information that may result in a personalized plan for cancer prevention and surveillance.  This negative genetic test simply tells [place] that we cannot yet define why Ms. Vawter has had an increased number of colorectal polyps. Ms. [facility] medical management and screening should be based on the prospect that she will likely form more colon polyps and should, therefore, undergo more frequent colonoscopy screening at intervals determined by her GI providers. We also recommended that Ms. Machorro have an upper endoscopy periodically.  According to NCCN Guidelines (Genetic/Familial High-Risk Assessment: Colorectal Version 1.2021), individuals who have had more than 20 but less than 100 adenomas and normal genetic testing should have a colonoscopy and polypectomy every 1-2 years. These individuals may also consider a baseline upper endoscopy (including complete visualization of the ampulla of Vater) and repeat as indicated.  Based on Ms. Senger's personal and family history, as well as her genetic test results, a statistical model Midwife) was used to estimate her risk of developing breast cancer. Tyrer-Cuzick estimates her lifetime risk of developing breast cancer to be approximately 3.2%. This lifetime breast cancer risk is a preliminary estimate based on available information using  one of several models endorsed by the [place] (ACS). The ACS recommends consideration of breast MRI screening as an adjunct to mammography for patients at high risk (defined as 20% or greater lifetime risk). A more detailed breast cancer risk assessment can be considered, if clinically indicated.    RECOMMENDATIONS FOR FAMILY MEMBERS:  Individuals in this family might be at some increased risk of developing cancer, over the general population risk, simply due to the family history of cancer.  We recommended women in this family have a yearly mammogram beginning at age 28, or 64 years  younger than the earliest onset of cancer, an annual clinical breast exam, and perform monthly breast self-exams. Women in this family should also have a gynecological exam as recommended by their primary provider. All family members should be referred for colonoscopy starting at age 42.  According to NCCN Guidelines (Genetic/Familial High-Risk Assessment: Colorectal Version 1.2021), all of Ms. Reddington's first-degree relatives should have colonoscopies beginning in their late teens, then every 2 years. Initial initiation age and frequency of colonoscopy may be modified based on clinical judgment.   It is also possible there is a hereditary cause for the cancer in Ms. Cumby's family that she did not inherit and therefore was not identified in her.  Based on Ms. Reza's family history, we recommended her sister, who was diagnosed with ovarian cancer at age 80, have genetic counseling and testing. Ms. Oberry will let us know if we can be of any assistance in coordinating genetic counseling and/or testing for this family member.   FOLLOW-UP: Lastly, we discussed with Ms. Netter that cancer genetics is a rapidly advancing field and it is possible that new genetic tests will be appropriate for her and/or her family members in the future. We encouraged her to remain in contact with cancer genetics on an annual basis so we can update her personal and family histories and let her know of advances in cancer genetics that may benefit this family.   Our contact number was provided. Ms. Pech questions were answered to her satisfaction, and she knows she is welcome to call us at anytime with additional questions or concerns.   Clint Guy, MS, [facility] Genetic Counselor [email or web address] Phone: [phone]

## 2020-06-07 ENCOUNTER — Encounter: Payer: Self-pay | Admitting: Nurse Practitioner

## 2020-06-07 ENCOUNTER — Ambulatory Visit: Payer: Commercial Managed Care - PPO | Attending: Nurse Practitioner | Admitting: Nurse Practitioner

## 2020-06-07 ENCOUNTER — Other Ambulatory Visit: Payer: Self-pay

## 2020-06-07 VITALS — BP 128/80 | HR 85 | Temp 98.7°F | Ht 63.0 in | Wt 164.0 lb

## 2020-06-07 DIAGNOSIS — E039 Hypothyroidism, unspecified: Secondary | ICD-10-CM | POA: Diagnosis not present

## 2020-06-07 DIAGNOSIS — E785 Hyperlipidemia, unspecified: Secondary | ICD-10-CM

## 2020-06-07 DIAGNOSIS — E1140 Type 2 diabetes mellitus with diabetic neuropathy, unspecified: Secondary | ICD-10-CM | POA: Diagnosis not present

## 2020-06-07 DIAGNOSIS — J0100 Acute maxillary sinusitis, unspecified: Secondary | ICD-10-CM | POA: Diagnosis not present

## 2020-06-07 DIAGNOSIS — Z114 Encounter for screening for human immunodeficiency virus [HIV]: Secondary | ICD-10-CM

## 2020-06-07 DIAGNOSIS — K219 Gastro-esophageal reflux disease without esophagitis: Secondary | ICD-10-CM

## 2020-06-07 LAB — POCT GLYCOSYLATED HEMOGLOBIN (HGB A1C): Hemoglobin A1C: 8.4 % — AB (ref 4.0–5.6)

## 2020-06-07 LAB — GLUCOSE, POCT (MANUAL RESULT ENTRY): POC Glucose: 197 mg/dl — AB (ref 70–99)

## 2020-06-07 MED ORDER — ONETOUCH DELICA LANCETS 30G MISC
1.0000 | Freq: Every day | 11 refills | Status: AC
Start: 2020-06-07 — End: 2020-07-07

## 2020-06-07 MED ORDER — PANTOPRAZOLE SODIUM 40 MG PO TBEC: 40.0000 mg | DELAYED_RELEASE_TABLET | Freq: Every day | ORAL | 2 refills | Status: DC

## 2020-06-07 MED ORDER — ONETOUCH DELICA LANCING DEV MISC: 99 refills | Status: DC

## 2020-06-07 MED ORDER — ATORVASTATIN CALCIUM 40 MG PO TABS: 40.0000 mg | ORAL_TABLET | Freq: Every day | ORAL | 1 refills | Status: DC

## 2020-06-07 MED ORDER — AMOXICILLIN-POT CLAVULANATE 875-125 MG PO TABS: 1.0000 | ORAL_TABLET | Freq: Two times a day (BID) | ORAL | 0 refills | Status: DC

## 2020-06-07 MED ORDER — ONETOUCH VERIO W/DEVICE KIT: PACK | 0 refills | Status: DC

## 2020-06-07 MED ORDER — GLIMEPIRIDE 2 MG PO TABS: 2.0000 mg | ORAL_TABLET | Freq: Every day | ORAL | 3 refills | Status: DC

## 2020-06-07 MED ORDER — ALBUTEROL SULFATE HFA 108 (90 BASE) MCG/ACT IN AERS: 2.0000 | INHALATION_SPRAY | Freq: Four times a day (QID) | RESPIRATORY_TRACT | 1 refills | Status: DC | PRN

## 2020-06-07 MED ORDER — GLUCOSE BLOOD VI STRP: ORAL_STRIP | 12 refills | Status: DC

## 2020-06-07 NOTE — Progress Notes (Signed)
Assessment & Plan:  Melissa Harmon was seen today for establish care.  Diagnoses and all orders for this visit:  Type 2 diabetes mellitus with diabetic neuropathy, without long-term current use of insulin (HCC) -     Glucose (CBG) -     HgB A1c -     Microalbumin/Creatinine Ratio, Urine -     glimepiride (AMARYL) 2 MG tablet; Take 1 tablet (2 mg total) by mouth daily before breakfast. INcrease to 2 tablets after 2 weeks if able to tolerate -     OneTouch Delica Lancets 74B MISC; 1 applicator by Subdermal route daily. Use as instructed. Check blood glucose level by fingerstick twice per day. E11.65 -     Lancet Devices (ONE TOUCH DELICA LANCING DEV) MISC; Use as instructed. Inject into the skin twice daily E11.65 -     Blood Glucose Monitoring Suppl (ONETOUCH VERIO) w/Device KIT; Use as instructed. Check blood glucose level by fingerstick twice per day. E11.65 -     glucose blood test strip; Use as instructed. Inject into the skin twice daily. E11.65 -     Basic metabolic panel Continue blood sugar control as discussed in office today, low carbohydrate diet, and regular physical exercise as tolerated, 150 minutes per week (30 min each day, 5 days per week, or 50 min 3 days per week). Keep blood sugar logs with fasting goal of 90-130 mg/dl, post prandial (after you eat) less than 180.  For Hypoglycemia: BS <60 and Hyperglycemia BS >400; contact the clinic ASAP. Annual eye exams and foot exams are recommended.  Encounter for screening for HIV -     HIV antibody (with reflex)  Hypothyroidism, unspecified type -     TSH  Gastroesophageal reflux disease, unspecified whether esophagitis present -     pantoprazole (PROTONIX) 40 MG tablet; Take 1 tablet (40 mg total) by mouth daily. INSTRUCTIONS: Avoid GERD Triggers: acidic, spicy or fried foods, caffeine, coffee, sodas,  alcohol and chocolate.   Dyslipidemia, goal LDL below 70 -     atorvastatin (LIPITOR) 40 MG tablet; Take 1 tablet (40 mg total) by  mouth daily. INSTRUCTIONS: Work on a low fat, heart healthy diet and participate in regular aerobic exercise program by working out at least 150 minutes per week; 5 days a week-30 minutes per day. Avoid red meat/beef/steak,  fried foods. junk foods, sodas, sugary drinks, unhealthy snacking, alcohol and smoking.  Drink at least 80 oz of water per day and monitor your carbohydrate intake daily.    Acute non-recurrent maxillary sinusitis -     amoxicillin-clavulanate (AUGMENTIN) 875-125 MG tablet; Take 1 tablet by mouth 2 (two) times daily.  Other orders -     albuterol (VENTOLIN HFA) 108 (90 Base) MCG/ACT inhaler; Inhale 2 puffs into the lungs every 6 (six) hours as needed for wheezing.    Patient has been counseled on age-appropriate routine health concerns for screening and prevention. These are reviewed and up-to-date. Referrals have been placed accordingly. Immunizations are up-to-date or declined.    Subjective:   Chief Complaint  Patient presents with  . Establish Care    Patient is here to establish care.    HPI Melissa Harmon 65 y.o. female presents to office today to establish care. Patient has been counseled on age-appropriate routine health concerns for screening and prevention. These are reviewed and up-to-date. Referrals have been placed accordingly. Immunizations are up-to-date or declined.    MAMMOGRAM: Overdue. Referral placed PAP: 04-2020  She has a history  of IBS, Hypothyroidism, DM, OA, Fibromyalgia    DM 2 Not well controlled. She has tried metformin for her DM which she states made her sick/constipation. Will start her on amaryl 2 mg daily.  She endorses blood glucose levels in the 200s. However she does not monitor her levels every day.  LDL not at goal. Taking atorvastatin 40 mg daily as prescribed.  Lab Results  Component Value Date   HGBA1C 8.4 (A) 06/07/2020   Lab Results  Component Value Date   HGBA1C 7.6 03/11/2020   Lab Results  Component Value Date    LDLCALC 185 (H) 05/20/2009    Hypothyroidism Well controlled with levothyroxine 137 mg daily.  Lab Results  Component Value Date   TSH 4.230 06/07/2020   Sinus Pain Patient complains of congestion, post nasal drip and purulent nasal discharge.  Onset of symptoms was a few day ago, gradually worsening since that time. She is drinking plenty of fluidsPast history is significant for COPD/ emphysema. Patient is a current smoker  Review of Systems  Constitutional: Negative for fever, malaise/fatigue and weight loss.  HENT: Positive for congestion and sinus pain. Negative for nosebleeds.   Eyes: Negative.  Negative for blurred vision, double vision and photophobia.  Respiratory: Positive for cough. Negative for shortness of breath and wheezing.   Cardiovascular: Negative.  Negative for chest pain, palpitations and leg swelling.  Gastrointestinal: Positive for heartburn. Negative for nausea and vomiting.  Musculoskeletal: Negative.  Negative for myalgias.  Neurological: Negative.  Negative for dizziness, focal weakness, seizures and headaches.  Psychiatric/Behavioral: Negative.  Negative for suicidal ideas.    Past Medical History:  Diagnosis Date  . Bronchitis   . Carpal tunnel syndrome on both sides   . Family history of breast cancer   . Family history of colonic polyps   . Family history of lung cancer   . Family history of ovarian cancer   . Family history of stomach cancer   . Fibromyalgia   . GERD (gastroesophageal reflux disease)   . History of chronic bronchitis   . History of Hashimoto thyroiditis    s/p  bilateral thryoidectomy 03/ 2001  . Hyperlipidemia   . Hypothyroidism, postsurgical followed by pcp   08-04-1999 s/p  bilateral thyroidectomy for multinodular goitar (per path hashimoto's thyroiditis)  . Irritable bowel syndrome with constipation   . MDD (major depressive disorder)   . Pelvic pain   . Right ovarian cyst   . Type 2 diabetes mellitus ([place])     followed by pcp  (08-24-2019 pt stated checks blood sugar dialy in am,  fasting-- 159-175)  . Wears contact lenses   . Wears dentures    upper    Past Surgical History:  Procedure Laterality Date  . CESAREAN SECTION  1986  . COLONOSCOPY  last one 05-25-2018  . LAPAROSCOPIC UNILATERAL SALPINGO OOPHERECTOMY Bilateral 09/01/2019   Procedure: LAPAROSCOPIC BILATERAL SALPINGO OOPHORECTOMY;  Surgeon: Sherlyn Hay, DO;  Location: [facility];  Service: Gynecology;  Laterality: Bilateral;  . THYROIDECTOMY Bilateral 08-04-1999  dr Bubba Camp  @[facility]    subtotal  . UPPER GASTROINTESTINAL ENDOSCOPY      Family History  Problem Relation Age of Onset  . Diabetes Mother   . Heart disease Mother   . Stroke Mother   . Bipolar disorder Father   . Colon polyps Father        unknown number  . Schizophrenia Brother   . Ovarian cancer Sister 74  or other gynecologic cancer  . Diverticulitis Maternal Aunt   . Stomach cancer Maternal Grandmother        dx ?  Marland Kitchen Heart attack Maternal Grandmother   . Colon polyps Other 25       >80 polyps (6th degree relative - mother's cousin's grandson)  . Heart disease Brother   . Heart disease Half-Brother   . Lung cancer Cousin        dx 53s (maternal first cousin)  . Breast cancer Cousin        dx 60s/70s (maternal first cousin)  . Esophageal cancer Neg Hx   . Rectal cancer Neg Hx     Social History Reviewed with no changes to be made today.   Outpatient Medications Prior to Visit  Medication Sig Dispense Refill  . docusate sodium (COLACE) 100 MG capsule Take 100 mg by mouth 2 (two) times daily as needed for mild constipation.    Marland Kitchen ibuprofen (ADVIL) 600 MG tablet Take 1 tablet (600 mg total) by mouth every 6 (six) hours as needed for moderate pain or cramping. 40 tablet 1  . levothyroxine (SYNTHROID) 137 MCG tablet Take 137 mcg by mouth every morning.    Marland Kitchen LINZESS 145 MCG CAPS capsule TAKE 1 CAPSULE BY MOUTH ONCE DAILY BEFORE  BREAKFAST 30 capsule 0  . albuterol (PROVENTIL HFA;VENTOLIN HFA) 108 (90 BASE) MCG/ACT inhaler Inhale 2 puffs into the lungs every 6 (six) hours as needed for wheezing.    Marland Kitchen amoxicillin (AMOXIL) 500 MG capsule Take 1,000 mg by mouth 2 (two) times daily.    Marland Kitchen atorvastatin (LIPITOR) 40 MG tablet Take 40 mg by mouth daily.    . metFORMIN (GLUCOPHAGE-XR) 500 MG 24 hr tablet Take 500 mg by mouth 2 (two) times daily.    . pantoprazole (PROTONIX) 40 MG tablet Take 40 mg by mouth daily.    Marland Kitchen MOBIC 15 MG tablet Take 1 tablet by mouth once a day  take daily for two weeks, and then only as needed afterwards.    . EUTHYROX 150 MCG tablet Take by mouth.    . gabapentin (NEURONTIN) 100 MG capsule Take 100 mg by mouth at bedtime as needed.    . sitaGLIPtin (JANUVIA) 50 MG tablet See admin instructions. (Patient not taking: Reported on 06/07/2020)     Facility-Administered Medications Prior to Visit  Medication Dose Route Frequency Provider Last Rate Last Admin  . 0.9 %  sodium chloride infusion  500 mL Intravenous Once Nandigam, Venia Minks, MD        Allergies  Allergen Reactions  . Sulfamethoxazole Hives       Objective:    BP 128/80 (BP Location: Left Arm, Patient Position: Sitting, Cuff Size: Normal)   Pulse 85   Temp 98.7 F (37.1 C) (Oral)   Ht 5' 3"  (1.6 m)   Wt 164 lb (74.4 kg)   SpO2 99%   BMI 29.05 kg/m  Wt Readings from Last 3 Encounters:  06/07/20 164 lb (74.4 kg)  03/26/20 163 lb (73.9 kg)  03/12/20 163 lb (73.9 kg)    Physical Exam Vitals and nursing note reviewed.  Constitutional:      Appearance: She is well-developed and well-nourished.  HENT:     Head: Normocephalic and atraumatic.     Nose:     Right Turbinates: Enlarged and swollen.     Left Turbinates: Enlarged and swollen.     Right Sinus: Maxillary sinus tenderness present.     Left Sinus:  Maxillary sinus tenderness present.  Eyes:     Extraocular Movements: EOM normal.  Cardiovascular:     Rate and Rhythm:  Normal rate and regular rhythm.     Pulses: Intact distal pulses.     Heart sounds: Normal heart sounds. No murmur heard. No friction rub. No gallop.   Pulmonary:     Effort: Pulmonary effort is normal. No tachypnea or respiratory distress.     Breath sounds: Normal breath sounds. No decreased breath sounds, wheezing, rhonchi or rales.  Chest:     Chest wall: No tenderness.  Abdominal:     General: Bowel sounds are normal.     Palpations: Abdomen is soft.  Musculoskeletal:        General: No edema. Normal range of motion.     Cervical back: Normal range of motion.  Skin:    General: Skin is warm and dry.  Neurological:     Mental Status: She is alert and oriented to person, place, and time.     Coordination: Coordination normal.  Psychiatric:        Mood and Affect: Mood and affect normal.        Behavior: Behavior normal. Behavior is cooperative.        Thought Content: Thought content normal.        Judgment: Judgment normal.          Patient has been counseled extensively about nutrition and exercise as well as the importance of adherence with medications and regular follow-up. The patient was given clear instructions to go to ER or return to medical center if symptoms don't improve, worsen or new problems develop. The patient verbalized understanding.   Follow-up: Return in about 4 weeks (around 07/05/2020) for meter check and TSH.   Gildardo Pounds, FNP-BC [facility] and [facility], [place]   06/13/2020, 12:25 AM

## 2020-06-08 LAB — BASIC METABOLIC PANEL
BUN/Creatinine Ratio: 10 — ABNORMAL LOW (ref 12–28)
BUN: 8 mg/dL (ref 8–27)
CO2: 23 mmol/L (ref 20–29)
Calcium: 9.5 mg/dL (ref 8.7–10.3)
Chloride: 105 mmol/L (ref 96–106)
Creatinine, Ser: 0.78 mg/dL (ref 0.57–1.00)
GFR calc Af Amer: 94 mL/min/{1.73_m2} (ref >59)
GFR calc non Af Amer: 82 mL/min/{1.73_m2} (ref >59)
Glucose: 160 mg/dL — ABNORMAL HIGH (ref 65–99)
Potassium: 4.6 mmol/L (ref 3.5–5.2)
Sodium: 141 mmol/L (ref 134–144)

## 2020-06-08 LAB — MICROALBUMIN / CREATININE URINE RATIO
Creatinine, Urine: 109.7 mg/dL
Microalb/Creat Ratio: 21 mg/g creat (ref 0–29)
Microalbumin, Urine: 23.2 ug/mL

## 2020-06-08 LAB — TSH: TSH: 4.23 u[IU]/mL (ref 0.450–4.500)

## 2020-06-08 LAB — HIV ANTIBODY (ROUTINE TESTING W REFLEX): HIV Screen 4th Generation wRfx: NONREACTIVE

## 2020-06-12 NOTE — Progress Notes (Incomplete)
Assessment & Plan:  Melissa Harmon was seen today for establish care.  Diagnoses and all orders for this visit:  Type 2 diabetes mellitus with diabetic neuropathy, without long-term current use of insulin (HCC) -     Glucose (CBG) -     HgB A1c -     Microalbumin/Creatinine Ratio, Urine -     glimepiride (AMARYL) 2 MG tablet; Take 1 tablet (2 mg total) by mouth daily before breakfast. INcrease to 2 tablets after 2 weeks if able to tolerate -     OneTouch Delica Lancets 81O MISC; 1 applicator by Subdermal route daily. Use as instructed. Check blood glucose level by fingerstick twice per day. E11.65 -     Lancet Devices (ONE TOUCH DELICA LANCING DEV) MISC; Use as instructed. Inject into the skin twice daily E11.65 -     Blood Glucose Monitoring Suppl (ONETOUCH VERIO) w/Device KIT; Use as instructed. Check blood glucose level by fingerstick twice per day. E11.65 -     glucose blood test strip; Use as instructed. Inject into the skin twice daily. E11.65 -     Basic metabolic panel  Encounter for screening for HIV -     HIV antibody (with reflex)  Hypothyroidism, unspecified type -     TSH  Gastroesophageal reflux disease, unspecified whether esophagitis present -     pantoprazole (PROTONIX) 40 MG tablet; Take 1 tablet (40 mg total) by mouth daily.  Dyslipidemia, goal LDL below 70 -     atorvastatin (LIPITOR) 40 MG tablet; Take 1 tablet (40 mg total) by mouth daily.  Acute non-recurrent maxillary sinusitis -     amoxicillin-clavulanate (AUGMENTIN) 875-125 MG tablet; Take 1 tablet by mouth 2 (two) times daily.  Other orders -     albuterol (VENTOLIN HFA) 108 (90 Base) MCG/ACT inhaler; Inhale 2 puffs into the lungs every 6 (six) hours as needed for wheezing.    Patient has been counseled on age-appropriate routine health concerns for screening and prevention. These are reviewed and up-to-date. Referrals have been placed accordingly. Immunizations are up-to-date or declined.    Subjective:    Chief Complaint  Patient presents with  . Establish Care    Patient is here to establish care.    HPI Melissa Harmon 65 y.o. female presents to office today to establish care. Patient has been counseled on age-appropriate routine health concerns for screening and prevention. These are reviewed and up-to-date. Referrals have been placed accordingly. Immunizations are up-to-date or declined.    MAMMOGRAM: Overdue. Referral placed PAP: 04-2020  She has a history of IBS, Hypothyroidism, DM, OA, Fibromyalgia    DM 2 Not well controlled. She has tried metformin for her DM which she states made her sick/constipation. She endorses blood glucose levels in the 200s. However she does not monitor her levels every day.   Lab Results  Component Value Date   HGBA1C 8.4 (A) 06/07/2020   Lab Results  Component Value Date   HGBA1C 7.6 03/11/2020     Hypothyroidism Well controlled with levothyroxine 137 mg daily.  Lab Results  Component Value Date   TSH 4.230 06/07/2020     ROS  Past Medical History:  Diagnosis Date  . Bronchitis   . Carpal tunnel syndrome on both sides   . Family history of breast cancer   . Family history of colonic polyps   . Family history of lung cancer   . Family history of ovarian cancer   . Family history of stomach cancer   .  Fibromyalgia   . GERD (gastroesophageal reflux disease)   . History of chronic bronchitis   . History of Hashimoto thyroiditis    s/p  bilateral thryoidectomy 03/ 2001  . Hyperlipidemia   . Hypothyroidism, postsurgical followed by pcp   08-04-1999 s/p  bilateral thyroidectomy for multinodular goitar (per path hashimoto's thyroiditis)  . Irritable bowel syndrome with constipation   . MDD (major depressive disorder)   . Pelvic pain   . Right ovarian cyst   . Type 2 diabetes mellitus ([place])    followed by pcp  (08-24-2019 pt stated checks blood sugar dialy in am,  fasting-- 159-175)  . Wears contact lenses   . Wears dentures     upper    Past Surgical History:  Procedure Laterality Date  . CESAREAN SECTION  1986  . COLONOSCOPY  last one 05-25-2018  . LAPAROSCOPIC UNILATERAL SALPINGO OOPHERECTOMY Bilateral 09/01/2019   Procedure: LAPAROSCOPIC BILATERAL SALPINGO OOPHORECTOMY;  Surgeon: Sherlyn Hay, DO;  Location: [facility];  Service: Gynecology;  Laterality: Bilateral;  . THYROIDECTOMY Bilateral 08-04-1999  dr Bubba Camp  _0    subtotal  . UPPER GASTROINTESTINAL ENDOSCOPY      Family History  Problem Relation Age of Onset  . Diabetes Mother   . Heart disease Mother   . Stroke Mother   . Bipolar disorder Father   . Colon polyps Father        unknown number  . Schizophrenia Brother   . Ovarian cancer Sister 21       or other gynecologic cancer  . Diverticulitis Maternal Aunt   . Stomach cancer Maternal Grandmother        dx ?  Marland Kitchen Heart attack Maternal Grandmother   . Colon polyps Other 25       >80 polyps (6th degree relative - mother's cousin's grandson)  . Heart disease Brother   . Heart disease Half-Brother   . Lung cancer Cousin        dx 74s (maternal first cousin)  . Breast cancer Cousin        dx 60s/70s (maternal first cousin)  . Esophageal cancer Neg Hx   . Rectal cancer Neg Hx     Social History Reviewed with no changes to be made today.   Outpatient Medications Prior to Visit  Medication Sig Dispense Refill  . docusate sodium (COLACE) 100 MG capsule Take 100 mg by mouth 2 (two) times daily as needed for mild constipation.    Marland Kitchen ibuprofen (ADVIL) 600 MG tablet Take 1 tablet (600 mg total) by mouth every 6 (six) hours as needed for moderate pain or cramping. 40 tablet 1  . levothyroxine (SYNTHROID) 137 MCG tablet Take 137 mcg by mouth every morning.    Marland Kitchen LINZESS 145 MCG CAPS capsule TAKE 1 CAPSULE BY MOUTH ONCE DAILY BEFORE BREAKFAST 30 capsule 0  . albuterol (PROVENTIL HFA;VENTOLIN HFA) 108 (90 BASE) MCG/ACT inhaler Inhale 2 puffs into the lungs every 6 (six) hours  as needed for wheezing.    Marland Kitchen amoxicillin (AMOXIL) 500 MG capsule Take 1,000 mg by mouth 2 (two) times daily.    Marland Kitchen atorvastatin (LIPITOR) 40 MG tablet Take 40 mg by mouth daily.    . metFORMIN (GLUCOPHAGE-XR) 500 MG 24 hr tablet Take 500 mg by mouth 2 (two) times daily.    . pantoprazole (PROTONIX) 40 MG tablet Take 40 mg by mouth daily.    . EUTHYROX 150 MCG tablet Take by mouth.    . MOBIC 15  MG tablet Take 1 tablet by mouth once a day  take daily for two weeks, and then only as needed afterwards.    . gabapentin (NEURONTIN) 100 MG capsule Take 100 mg by mouth at bedtime as needed.    . sitaGLIPtin (JANUVIA) 50 MG tablet See admin instructions. (Patient not taking: Reported on 06/07/2020)     Facility-Administered Medications Prior to Visit  Medication Dose Route Frequency Provider Last Rate Last Admin  . 0.9 %  sodium chloride infusion  500 mL Intravenous Once Nandigam, Venia Minks, MD        Allergies  Allergen Reactions  . Sulfamethoxazole Hives       Objective:    BP 128/80 (BP Location: Left Arm, Patient Position: Sitting, Cuff Size: Normal)   Pulse 85   Temp 98.7 F (37.1 C) (Oral)   Ht _0  (1.6 m)   Wt 164 lb (74.4 kg)   SpO2 99%   BMI 29.05 kg/m  Wt Readings from Last 3 Encounters:  06/07/20 164 lb (74.4 kg)  03/26/20 163 lb (73.9 kg)  03/12/20 163 lb (73.9 kg)    Physical Exam       Patient has been counseled extensively about nutrition and exercise as well as the importance of adherence with medications and regular follow-up. The patient was given clear instructions to go to ER or return to medical center if symptoms don't improve, worsen or new problems develop. The patient verbalized understanding.   Follow-up: Return in about 4 weeks (around 07/05/2020) for meter check and TSH.   Gildardo Pounds, FNP-BC [facility] [place], [place]   06/12/2020, 11:53 PM

## 2020-06-13 ENCOUNTER — Encounter: Payer: Self-pay | Admitting: Nurse Practitioner

## 2020-06-18 ENCOUNTER — Other Ambulatory Visit: Payer: Self-pay

## 2020-06-18 ENCOUNTER — Ambulatory Visit
Admission: EM | Admit: 2020-06-18 | Discharge: 2020-06-18 | Disposition: A | Discharge status: Home / Self Care | Payer: Commercial Managed Care - PPO | Attending: Emergency Medicine | Admitting: Emergency Medicine

## 2020-06-18 DIAGNOSIS — N760 Acute vaginitis: Secondary | ICD-10-CM | POA: Diagnosis not present

## 2020-06-18 DIAGNOSIS — R300 Dysuria: Secondary | ICD-10-CM | POA: Diagnosis not present

## 2020-06-18 LAB — POCT URINALYSIS DIP (MANUAL ENTRY)
Bilirubin, UA: NEGATIVE
Blood, UA: NEGATIVE
Glucose, UA: NEGATIVE mg/dL
Ketones, POC UA: NEGATIVE mg/dL
Nitrite, UA: NEGATIVE
Protein Ur, POC: NEGATIVE mg/dL
Spec Grav, UA: 1.025 (ref 1.010–1.025)
Urobilinogen, UA: 0.2 E.U./dL
pH, UA: 5.5 (ref 5.0–8.0)

## 2020-06-18 MED ORDER — FLUCONAZOLE 150 MG PO TABS: 150.0000 mg | ORAL_TABLET | Freq: Every day | ORAL | 0 refills | Status: DC

## 2020-06-18 MED ORDER — NITROFURANTOIN MONOHYD MACRO 100 MG PO CAPS: 100.0000 mg | ORAL_CAPSULE | Freq: Two times a day (BID) | ORAL | 0 refills | Status: DC

## 2020-06-18 NOTE — ED Triage Notes (Signed)
Patient states she has had vaginal itching x 1 week as well as some mild burning during urination. Pt has tried Monistat with no relief. Pt is aox4 and ambulatory.

## 2020-06-18 NOTE — Discharge Instructions (Addendum)
We will call you if we need to change the antibiotic if your urine culture is positive and the medication I sent is resistant.   Get off sodas and avoid sweets.

## 2020-06-18 NOTE — ED Provider Notes (Signed)
[facility]    CSN: [id number] Arrival date & time: 06/18/20  3545      History   Chief Complaint Chief Complaint  Patient presents with  . Vaginal Itching    X 1 week  . Dysuria    X 1 week    HPI Melissa Harmon is a 65 y.o. female who has been having vaginal itching x 1 week as well as mild burning during urination. Has tried Monistat but felt lke it made her worse. She is prone to having yeast due to her DM which her fasting glucose been running around 140. She is not sexually active.     Past Medical History:  Diagnosis Date  . Bronchitis   . Carpal tunnel syndrome on both sides   . Family history of breast cancer   . Family history of colonic polyps   . Family history of lung cancer   . Family history of ovarian cancer   . Family history of stomach cancer   . Fibromyalgia   . GERD (gastroesophageal reflux disease)   . History of chronic bronchitis   . History of Hashimoto thyroiditis    s/p  bilateral thryoidectomy 03/ 2001  . Hyperlipidemia   . Hypothyroidism, postsurgical followed by pcp   08-04-1999 s/p  bilateral thyroidectomy for multinodular goitar (per path hashimoto's thyroiditis)  . Irritable bowel syndrome with constipation   . MDD (major depressive disorder)   . Pelvic pain   . Right ovarian cyst   . Type 2 diabetes mellitus ([place])    followed by pcp  (08-24-2019 pt stated checks blood sugar dialy in am,  fasting-- 159-175)  . Wears contact lenses   . Wears dentures    upper    Patient Active Problem List   Diagnosis Date Noted  . Diabetes mellitus with neuropathy ([place]) 06/07/2020  . Genetic testing 05/22/2020  . Family history of colonic polyps   . Family history of ovarian cancer   . Family history of stomach cancer   . Family history of lung cancer   . Family history of breast cancer   . Adjustment disorder with anxious mood 09/18/2017  . MDD (major depressive disorder), recurrent episode, moderate ([place]) 09/18/2017  .  Insomnia due to other mental disorder 09/18/2017  . DYSURIA 10/25/2009  . ABDOMINAL PAIN, RIGHT UPPER QUADRANT 10/25/2009  . NECK PAIN, LEFT 08/23/2009  . HAND PAIN, RIGHT 08/23/2009  . ELBOW PAIN, BILATERAL 05/17/2009  . TOBACCO USER 05/13/2009  . DIABETES MELLITUS, TYPE II, WITHOUT COMPLICATIONS 62/56/3893  . DYSLIPIDEMIA 12/14/2008  . SHORTNESS OF BREATH 12/14/2008  . RECTAL BLEEDING 04/22/2007  . COLONIC POLYPS, HX OF 04/22/2007  . BLOOD IN STOOL 04/15/2007  . PITYRIASIS ROSEA 08/09/2006  . HYPOTHYROIDISM, UNSPECIFIED 07/08/2006  . GASTROESOPHAGEAL REFLUX, NO ESOPHAGITIS 07/08/2006  . FIBROMYALGIA, FIBROMYOSITIS 07/08/2006    Past Surgical History:  Procedure Laterality Date  . CESAREAN SECTION  1986  . COLONOSCOPY  last one 05-25-2018  . LAPAROSCOPIC UNILATERAL SALPINGO OOPHERECTOMY Bilateral 09/01/2019   Procedure: LAPAROSCOPIC BILATERAL SALPINGO OOPHORECTOMY;  Surgeon: Sherlyn Hay, DO;  Location: [facility];  Service: Gynecology;  Laterality: Bilateral;  . THYROIDECTOMY Bilateral 08-04-1999  dr Bubba Camp  @[facility]    subtotal  . UPPER GASTROINTESTINAL ENDOSCOPY      OB History   No obstetric history on file.      Home Medications    Prior to Admission medications   Medication Sig Start Date End Date Taking? Authorizing Provider  atorvastatin (LIPITOR) 40 MG tablet Take 1 tablet (40 mg total) by mouth daily. 06/07/20 09/05/20 Yes Gildardo Pounds, NP  Blood Glucose Monitoring Suppl (ONETOUCH VERIO) w/Device KIT Use as instructed. Check blood glucose level by fingerstick twice per day. E11.65 06/07/20  Yes Gildardo Pounds, NP  docusate sodium (COLACE) 100 MG capsule Take 100 mg by mouth 2 (two) times daily as needed for mild constipation.   Yes [provider]  fluconazole (DIFLUCAN) 150 MG tablet Take 1 tablet (150 mg total) by mouth daily. 06/18/20  Yes Rodriguez-Southworth, Sunday Spillers, PA-C  glimepiride (AMARYL) 2 MG tablet Take 1 tablet (2 mg  total) by mouth daily before breakfast. INcrease to 2 tablets after 2 weeks if able to tolerate 06/07/20  Yes Geryl Rankins W, NP  glucose blood test strip Use as instructed. Inject into the skin twice daily. E11.65 06/07/20  Yes Gildardo Pounds, NP  ibuprofen (ADVIL) 600 MG tablet Take 1 tablet (600 mg total) by mouth every 6 (six) hours as needed for moderate pain or cramping. 09/01/19  Yes Banga, Bonnee Quin, DO  Lancet Devices (ONE TOUCH DELICA LANCING DEV) MISC Use as instructed. Inject into the skin twice daily E11.65 06/07/20  Yes Gildardo Pounds, NP  levothyroxine (SYNTHROID) 137 MCG tablet Take 137 mcg by mouth every morning. 05/01/20  Yes [provider]  LINZESS 145 MCG CAPS capsule TAKE 1 CAPSULE BY MOUTH ONCE DAILY BEFORE BREAKFAST 04/24/20  Yes Nandigam, Venia Minks, MD  nitrofurantoin, macrocrystal-monohydrate, (MACROBID) 100 MG capsule Take 1 capsule (100 mg total) by mouth 2 (two) times daily. 06/18/20  Yes Rodriguez-Southworth, Sunday Spillers, PA-C  OneTouch Delica Lancets 36R MISC 1 applicator by Subdermal route daily. Use as instructed. Check blood glucose level by fingerstick twice per day. E11.65 06/07/20 07/07/20 Yes Gildardo Pounds, NP  pantoprazole (PROTONIX) 40 MG tablet Take 1 tablet (40 mg total) by mouth daily. 06/07/20 09/05/20 Yes Gildardo Pounds, NP  albuterol (VENTOLIN HFA) 108 (90 Base) MCG/ACT inhaler Inhale 2 puffs into the lungs every 6 (six) hours as needed for wheezing. 06/07/20   Gildardo Pounds, NP  MOBIC 15 MG tablet Take 1 tablet by mouth once a day  take daily for two weeks, and then only as needed afterwards. 05/23/20   [provider]  omeprazole (PRILOSEC) 20 MG capsule Take 1 capsule (20 mg total) by mouth daily. Patient not taking: Reported on 03/13/2015 12/26/14 03/13/15  Serita Grit, MD    Family History Family History  Problem Relation Age of Onset  . Diabetes Mother   . Heart disease Mother   . Stroke Mother   . Bipolar disorder Father    . Colon polyps Father        unknown number  . Schizophrenia Brother   . Ovarian cancer Sister 53       or other gynecologic cancer  . Diverticulitis Maternal Aunt   . Stomach cancer Maternal Grandmother        dx ?  Marland Kitchen Heart attack Maternal Grandmother   . Colon polyps Other 25       >80 polyps (6th degree relative - mother's cousin's grandson)  . Heart disease Brother   . Heart disease Half-Brother   . Lung cancer Cousin        dx 1s (maternal first cousin)  . Breast cancer Cousin        dx 60s/70s (maternal first cousin)  . Esophageal cancer Neg Hx   . Rectal cancer Neg  Hx     Social History Social History   Tobacco Use  . Smoking status: Current Every Day Smoker    Packs/day: 0.25    Years: 25.00    Pack years: 6.25    Types: Cigarettes  . Smokeless tobacco: Never Used  . Tobacco comment: 6 cig per day  Vaping Use  . Vaping Use: Never used  Substance Use Topics  . Alcohol use: Yes    Alcohol/week: 6.0 standard drinks    Types: 6 Cans of beer per week  . Drug use: Never     Allergies   Sulfamethoxazole   Review of Systems Review of Systems  Genitourinary: Positive for dysuria. Negative for flank pain, frequency, pelvic pain, urgency, vaginal discharge and vaginal pain.       + vaginal itching  Musculoskeletal: Negative for back pain, gait problem and myalgias.  Skin: Negative for rash and wound.  Hematological: Negative for adenopathy.     Physical Exam Triage Vital Signs ED Triage Vitals  Enc Vitals Group     BP 06/18/20 0839 130/85     Pulse Rate 06/18/20 0839 78     Resp 06/18/20 0839 18     Temp 06/18/20 0839 98 F (36.7 C)     Temp Source 06/18/20 0839 Oral     SpO2 06/18/20 0839 97 %     Weight --      Height --      Head Circumference --      Peak Flow --      Pain Score 06/18/20 0840 2     Pain Loc --      Pain Edu? --      Excl. in [place]? --    No data found.  Updated Vital Signs BP 130/85 (BP Location: Left Arm)   Pulse 78    Temp 98 F (36.7 C) (Oral)   Resp 18   SpO2 97%   Visual Acuity Right Eye Distance:   Left Eye Distance:   Bilateral Distance:    Right Eye Near:   Left Eye Near:    Bilateral Near:     Physical Exam Vitals and nursing note reviewed.  Constitutional:      General: She is not in acute distress.    Appearance: She is obese.  HENT:     Head: Normocephalic.     Right Ear: External ear normal.     Left Ear: External ear normal.  Eyes:     General: No scleral icterus.    Conjunctiva/sclera: Conjunctivae normal.  Pulmonary:     Effort: Pulmonary effort is normal.  Abdominal:     Palpations: Abdomen is soft.     Tenderness: There is no abdominal tenderness. There is no right CVA tenderness or left CVA tenderness.  Musculoskeletal:        General: Normal range of motion.     Cervical back: Neck supple.  Skin:    General: Skin is warm and dry.  Neurological:     Mental Status: She is alert and oriented to person, place, and time.     Gait: Gait normal.  Psychiatric:        Mood and Affect: Mood normal.        Behavior: Behavior normal.        Thought Content: Thought content normal.        Judgment: Judgment normal.      UC Treatments / Results  Labs (all labs ordered are listed, but only  abnormal results are displayed) Labs Reviewed  POCT URINALYSIS DIP (MANUAL ENTRY) - Abnormal; Notable for the following components:      Result Value   Leukocytes, UA Small (1+) (*)    All other components within normal limits  URINE CULTURE    EKG   Radiology No results found.  Procedures Procedures (including critical care time)  Medications Ordered in UC Medications - No data to display  Initial Impression / Assessment and Plan / UC Course  I have reviewed the triage vital signs and the nursing notes.  Pertinent labs  results that were available during my care of the patient were reviewed by me and considered in my medical decision making (see chart for  details). Urine was sent out for a culture and in the mean time I placed her on Macrobid and Diflucan as noted. Needs to control her DM better since high sugars will cause recurrent yeast    Final Clinical Impressions(s) / UC Diagnoses   Final diagnoses:  Dysuria  Vaginitis and vulvovaginitis     Discharge Instructions     We will call you if we need to change the antibiotic if your urine culture is positive and the medication I sent is resistant.   Get off sodas and avoid sweets.     ED Prescriptions    Medication Sig Dispense Auth. Provider   nitrofurantoin, macrocrystal-monohydrate, (MACROBID) 100 MG capsule Take 1 capsule (100 mg total) by mouth 2 (two) times daily. 10 capsule Rodriguez-Southworth, Sunday Spillers, PA-C   fluconazole (DIFLUCAN) 150 MG tablet Take 1 tablet (150 mg total) by mouth daily. 2 tablet Rodriguez-Southworth, Sunday Spillers, PA-C     PDMP not reviewed this encounter.   Shelby Mattocks, [place] 06/18/20 2222

## 2020-06-20 LAB — URINE CULTURE
Culture: >=100000 — AB
Special Requests: NORMAL

## 2020-07-05 ENCOUNTER — Ambulatory Visit: Payer: Commercial Managed Care - PPO | Admitting: Pharmacist

## 2020-07-16 ENCOUNTER — Other Ambulatory Visit: Payer: Self-pay

## 2020-07-16 ENCOUNTER — Encounter: Payer: Self-pay | Admitting: Pharmacist

## 2020-07-16 ENCOUNTER — Ambulatory Visit: Payer: Commercial Managed Care - PPO | Attending: Nurse Practitioner | Admitting: Pharmacist

## 2020-07-16 DIAGNOSIS — E1140 Type 2 diabetes mellitus with diabetic neuropathy, unspecified: Secondary | ICD-10-CM

## 2020-07-16 LAB — GLUCOSE, POCT (MANUAL RESULT ENTRY): POC Glucose: 72 mg/dl (ref 70–99)

## 2020-07-16 MED ORDER — GLIPIZIDE ER 5 MG PO TB24: 5.0000 mg | ORAL_TABLET | Freq: Every day | ORAL | 1 refills | Status: DC

## 2020-07-16 NOTE — Progress Notes (Signed)
    S:   PCP: Melissa Harmon    No chief complaint on file.  Patient arrives well and in spirits.  Presents for diabetes evaluation, education, and management. Patient was referred and last seen by Primary Care Provider, Melissa Harmon, on 06/07/2020 to establish care. At that visit, Melissa Harmon initiated glimepiride 2 mg x 1 week, then 4 mg thereafter. Of note, pt previously failed metformin d/t side effects.  Today, patient reports appropriate adherence to newly prescribed diabetes medication. Denies side effects or medication related issues. Patient presents with glucometer today (see BG recordings below). Denies any hypoglycemic events, however, blood sugar was 72 today in office. She "felt sleepy while driving to the appointment" today. Reports she has eaten 3 times today, the last being around noon.   Family/Social History: CVD, DM, stroke (mother); smoker   Insurance coverage/medication affordability: [employer]  Medication adherence reported appropriate. Current diabetes medications include: glimepiride 4 mg daily  Current hyperlipidemia medications include: atorvastatin 40 mg daily   Patient denies hypoglycemic events.  Patient reported dietary habits: Eats 3-4 meals/day  Patient-reported exercise habits: none reported   Patient denies nocturia (nighttime urination).  Patient denies neuropathy (nerve pain).  Patient denies visual changes (has improved since starting glimepiride) Patient reports self foot exams.     O:  Physical Exam  POCT: 72 - patient asymptomatic, given ginger ale in office  ROS   Lab Results  Component Value Date   HGBA1C 8.4 (A) 06/07/2020   There were no vitals filed for this visit.  Lipid Panel     Component Value Date/Time   CHOL 257 (H) 05/20/2009 0000   TRIG 127 05/20/2009 0000   HDL 47 05/20/2009 0000   CHOLHDL 5.5 Ratio 05/20/2009 0000   VLDL 25 05/20/2009 0000   LDLCALC 185 (H) 05/20/2009 0000   LDLDIRECT 124 (H) 08/23/2009 2023     Home fasting blood sugars:  3/8 134  3/7 119  3/6 148  3/5 163  3/4 131  3/3 132  3/2 116  3/1 82     Clinical Atherosclerotic Cardiovascular Disease (ASCVD): No  The ASCVD Risk score Mikey Bussing DC Jr., et al., 2013) failed to calculate for the following reasons:   Cannot find a previous HDL lab   Cannot find a previous total cholesterol lab    A/P: Diabetes currently uncontrolled. Patient is able to verbalize appropriate hypoglycemia management plan. Medication adherence appears appropriate. Control is suboptimal due to limited therapy options. Previously failed metformin d/t GI related effects. Given GI history, will avoid GLP-1 agonist agents.Would also like to avoid SGLT-2 d/t recent ED visit for UTI/yeast infection. Given lower blood sugar of 72 today, will switch glimepiride to ER glipizide, as this form of glipizide has lower risks of hypoglycemia. Close follow-up in 2 weeks. - Discontinued glimepiride 4 mg daily - Started glipizide ER 5 mg daily with a meal  -Extensively discussed pathophysiology of diabetes, recommended lifestyle interventions, dietary effects on blood sugar control -Counseled on s/sx of and management of hypoglycemia -Next A1C anticipated April 2022.   ASCVD risk - primary prevention in patient with diabetes. Last LDL is unknown.  -Continued atorvastatin 40 mg.   Written patient instructions provided.  Total time in face to face counseling 15 minutes.   Follow up Pharmacist Clinic Visit in 2 weeks.   Patient seen with Melissa Harmon.  Melissa Harmon, PharmD PGY-1 [facility] Pharmacy Resident  07/16/2020 2:59 PM

## 2020-07-30 ENCOUNTER — Ambulatory Visit: Payer: Commercial Managed Care - PPO | Attending: Nurse Practitioner | Admitting: Pharmacist

## 2020-07-30 ENCOUNTER — Encounter: Payer: Self-pay | Admitting: Pharmacist

## 2020-07-30 ENCOUNTER — Other Ambulatory Visit: Payer: Self-pay

## 2020-07-30 DIAGNOSIS — E1140 Type 2 diabetes mellitus with diabetic neuropathy, unspecified: Secondary | ICD-10-CM | POA: Diagnosis not present

## 2020-07-30 NOTE — Progress Notes (Signed)
    S:   PCP: [name]    No chief complaint on file.  Patient arrives well and in spirits.  Presents for diabetes evaluation, education, and management. Patient was referred and last seen by Primary Care Provider, Geryl Rankins, on 06/07/2020 to establish care. Seen by clinical pharmacist on 07/16/2020 - glimepiride was discontinued due to hypoglycemic episodes and switching to extended release glipizide.   Today, patient reports appropriate adherence medication changes. Denies any side effects to glipizide. No hypoglycemic episodes. She brings her glucometer with her today (see below). Of note, historically intolerant to metformin. Also has history of significant GI related problems and recurrent UTI/yeast infections.   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  Patient reported dietary habits: Eats 3-4 meals/day  Patient-reported exercise habits: none reported   Patient denies nocturia (nighttime urination). (has improved greatly) Patient denies neuropathy (nerve pain).  Patient denies visual changes. (has improved greatly) Patient reports self foot exams.     O:  Lab Results  Component Value Date   HGBA1C 8.4 (A) 06/07/2020   There were no vitals filed for this visit.  Lipid Panel     Component Value Date/Time   CHOL 257 (H) 05/20/2009 0000   TRIG 127 05/20/2009 0000   HDL 47 05/20/2009 0000   CHOLHDL 5.5 Ratio 05/20/2009 0000   VLDL 25 05/20/2009 0000   LDLCALC 185 (H) 05/20/2009 0000   LDLDIRECT 124 (H) 08/23/2009 2023    Home fasting blood sugars:  3/22 106   3/21 135   3/20 125   3/19 145   3/18 118   3/17 166 *1-2 hours post prandial  3/17 122   3/16 109   3/15 89   3/15 128 *2 hours post breakfast  3/14 134     Average FASTING BG: 121  Clinical Atherosclerotic Cardiovascular Disease (ASCVD): No  The ASCVD Risk score Mikey Bussing DC Jr., et al., 2013) failed to calculate for the following reasons:   Cannot find a previous HDL lab   Cannot find a previous total cholesterol lab    A/P: Diabetes close to being controlled. Goal A1c <7%. Patient is able to verbalize appropriate hypoglycemia management plan. Medication adherence appears appropriate. Control has historically been suboptimal due to limited therapy options. Tolerating the extended release glipizide very well. Given controlled blood sugars at home, will continue current therapy. Can consider increasing to glipizide ER 10 mg daily if needed in the future. Patient advised to make appointment with [name] at the end of April for repeat A1c.  -continue current regimen  -Extensively discussed pathophysiology of diabetes, recommended lifestyle interventions, dietary effects on blood sugar control -Counseled on s/sx of and management of hypoglycemia -Next A1C anticipated April 2022.   ASCVD risk - primary prevention in patient with diabetes. Last LDL is unknown.  -Continued atorvastatin 40 mg.   Written patient instructions provided.  Total time in face to face counseling 15 minutes.   Follow up with PCP at end of April.   Patient seen with Navina Wohlers.  Harriet Pho, PharmD PGY-1 [facility] Resident  07/30/2020 2:20 PM

## 2020-08-09 ENCOUNTER — Telehealth: Payer: Self-pay | Admitting: Nurse Practitioner

## 2020-08-09 NOTE — Telephone Encounter (Signed)
Copied from [place] [id number]. Topic: Referral - Request for Referral >> Aug 09, 2020  9:41 AM Tessa Lerner A wrote: Has patient seen PCP for this complaint? Yes  *If NO, is insurance requiring patient see PCP for this issue before PCP can refer them?  Referral for which specialty: Neurology  Preferred provider/office: Patient has no preference but would prefer that they be in [facility]  Reason for referral: Patient has total body concerns and is frequently experiencing pain

## 2020-08-09 NOTE — Telephone Encounter (Signed)
Will forward to provider  

## 2020-08-12 ENCOUNTER — Other Ambulatory Visit: Payer: Self-pay | Admitting: Nurse Practitioner

## 2020-08-12 MED ORDER — AMITRIPTYLINE HCL 10 MG PO TABS: 10.0000 mg | ORAL_TABLET | Freq: Every day | ORAL | 3 refills | Status: DC

## 2020-08-13 NOTE — Telephone Encounter (Signed)
Already addressed with Ms Terrero on 08-12-2020

## 2020-08-29 ENCOUNTER — Other Ambulatory Visit: Payer: Self-pay | Admitting: Gastroenterology

## 2020-09-10 ENCOUNTER — Other Ambulatory Visit: Payer: Self-pay | Admitting: Gastroenterology

## 2020-09-15 ENCOUNTER — Encounter (HOSPITAL_COMMUNITY): Payer: Self-pay

## 2020-09-15 ENCOUNTER — Other Ambulatory Visit: Payer: Self-pay

## 2020-09-15 ENCOUNTER — Ambulatory Visit (HOSPITAL_COMMUNITY)
Admission: EM | Admit: 2020-09-15 | Discharge: 2020-09-15 | Disposition: A | Discharge status: Home / Self Care | Payer: Commercial Managed Care - PPO | Attending: Student | Admitting: Student

## 2020-09-15 DIAGNOSIS — M797 Fibromyalgia: Secondary | ICD-10-CM | POA: Diagnosis not present

## 2020-09-15 DIAGNOSIS — H65192 Other acute nonsuppurative otitis media, left ear: Secondary | ICD-10-CM

## 2020-09-15 MED ORDER — PREDNISONE 20 MG PO TABS: 20.0000 mg | ORAL_TABLET | Freq: Every day | ORAL | 0 refills | Status: AC

## 2020-09-15 MED ORDER — TIZANIDINE HCL 2 MG PO CAPS: 2.0000 mg | ORAL_CAPSULE | Freq: Three times a day (TID) | ORAL | 0 refills | Status: DC

## 2020-09-15 NOTE — Discharge Instructions (Addendum)
-  Start the muscle relaxer-Zanaflex (tizanidine), up to 3 times daily for muscle spasms and pain.  This can make you drowsy, so take at bedtime or when you do not need to drive or operate machinery. -Prednisone 1 pill daily for 5 days in a row. I recommend taking this in the morning as it could give you energy.  Try to avoid taking NSAIDs while on this medication, like ibuprofen or Aleve, as this can increase your chances of gastritis or stomach issues. -You can take Tylenol 1000 mg up to 3 times daily for additional relief. -Follow-up with your primary care provider for titration of the amitriptyline and improved control of fibromyalgia.

## 2020-09-15 NOTE — ED Triage Notes (Signed)
Pt reports body aches and left era painx 3 days. Denies fever, chills, ear drainage.

## 2020-09-15 NOTE — ED Provider Notes (Signed)
[place]    CSN: [id number] Arrival date & time: 09/15/20  1008      History   Chief Complaint Chief Complaint  Patient presents with  . Otalgia  . Generalized Body Aches    HPI Melissa Harmon is a 65 y.o. female presenting with generalized body aches, left ear pain x3 days.  Medical history of bronchitis, fibromyalgia.  Endorses 3 days of left ear irritation.  Denies hearing changes, tinnitus, dizziness, ear pain, fever/chills, cough, congestion, URI symptoms, allergies.  States that she does not have a history of ear infections, but sometimes the headset she uses for work irritates her ears. Also states she amitriptyline that she takes for her fibromyalgia is not controlling her symptoms adequately. Endorses muscle aches all over, particularly in her hands. Denies trauma, MVC, etc. Denies weakness/ sensation changes in her arms or legs.   HPI  Past Medical History:  Diagnosis Date  . Bronchitis   . Carpal tunnel syndrome on both sides   . Family history of breast cancer   . Family history of colonic polyps   . Family history of lung cancer   . Family history of ovarian cancer   . Family history of stomach cancer   . Fibromyalgia   . GERD (gastroesophageal reflux disease)   . History of chronic bronchitis   . History of Hashimoto thyroiditis    s/p  bilateral thryoidectomy 03/ 2001  . Hyperlipidemia   . Hypothyroidism, postsurgical followed by pcp   08-04-1999 s/p  bilateral thyroidectomy for multinodular goitar (per path hashimoto's thyroiditis)  . Irritable bowel syndrome with constipation   . MDD (major depressive disorder)   . Pelvic pain   . Right ovarian cyst   . Type 2 diabetes mellitus ([place])    followed by pcp  (08-24-2019 pt stated checks blood sugar dialy in am,  fasting-- 159-175)  . Wears contact lenses   . Wears dentures    upper    Patient Active Problem List   Diagnosis Date Noted  . Diabetes mellitus with neuropathy ([place]) 06/07/2020  .  Genetic testing 05/22/2020  . Family history of colonic polyps   . Family history of ovarian cancer   . Family history of stomach cancer   . Family history of lung cancer   . Family history of breast cancer   . Adjustment disorder with anxious mood 09/18/2017  . MDD (major depressive disorder), recurrent episode, moderate ([place]) 09/18/2017  . Insomnia due to other mental disorder 09/18/2017  . DYSURIA 10/25/2009  . ABDOMINAL PAIN, RIGHT UPPER QUADRANT 10/25/2009  . NECK PAIN, LEFT 08/23/2009  . HAND PAIN, RIGHT 08/23/2009  . ELBOW PAIN, BILATERAL 05/17/2009  . TOBACCO USER 05/13/2009  . DIABETES MELLITUS, TYPE II, WITHOUT COMPLICATIONS 62/83/6629  . DYSLIPIDEMIA 12/14/2008  . SHORTNESS OF BREATH 12/14/2008  . RECTAL BLEEDING 04/22/2007  . COLONIC POLYPS, HX OF 04/22/2007  . BLOOD IN STOOL 04/15/2007  . PITYRIASIS ROSEA 08/09/2006  . HYPOTHYROIDISM, UNSPECIFIED 07/08/2006  . GASTROESOPHAGEAL REFLUX, NO ESOPHAGITIS 07/08/2006  . FIBROMYALGIA, FIBROMYOSITIS 07/08/2006    Past Surgical History:  Procedure Laterality Date  . CESAREAN SECTION  1986  . COLONOSCOPY  last one 05-25-2018  . LAPAROSCOPIC UNILATERAL SALPINGO OOPHERECTOMY Bilateral 09/01/2019   Procedure: LAPAROSCOPIC BILATERAL SALPINGO OOPHORECTOMY;  Surgeon: Sherlyn Hay, DO;  Location: [facility];  Service: Gynecology;  Laterality: Bilateral;  . THYROIDECTOMY Bilateral 08-04-1999  dr Bubba Camp  @[facility]    subtotal  . UPPER GASTROINTESTINAL ENDOSCOPY  OB History   No obstetric history on file.      Home Medications    Prior to Admission medications   Medication Sig Start Date End Date Taking? Authorizing Provider  predniSONE (DELTASONE) 20 MG tablet Take 1 tablet (20 mg total) by mouth daily for 5 days. 09/15/20 09/20/20 Yes Hazel Sams, PA-C  tizanidine (ZANAFLEX) 2 MG capsule Take 1 capsule (2 mg total) by mouth 3 (three) times daily. 09/15/20  Yes Hazel Sams, PA-C  albuterol (VENTOLIN  HFA) 108 (90 Base) MCG/ACT inhaler Inhale 2 puffs into the lungs every 6 (six) hours as needed for wheezing. 06/07/20   Gildardo Pounds, NP  amitriptyline (ELAVIL) 10 MG tablet Take 1 tablet (10 mg total) by mouth at bedtime. 08/12/20 09/11/20  Gildardo Pounds, NP  atorvastatin (LIPITOR) 40 MG tablet Take 1 tablet (40 mg total) by mouth daily. 06/07/20 09/05/20  Gildardo Pounds, NP  Blood Glucose Monitoring Suppl (ONETOUCH VERIO) w/Device KIT Use as instructed. Check blood glucose level by fingerstick twice per day. E11.65 06/07/20   Gildardo Pounds, NP  docusate sodium (COLACE) 100 MG capsule Take 100 mg by mouth 2 (two) times daily as needed for mild constipation.    [provider]  fluconazole (DIFLUCAN) 150 MG tablet Take 1 tablet (150 mg total) by mouth daily. 06/18/20   Rodriguez-Southworth, Sunday Spillers, PA-C  glipiZIDE (GLUCOTROL XL) 5 MG 24 hr tablet Take 1 tablet (5 mg total) by mouth daily with breakfast. 07/16/20   Charlott Rakes, MD  glucose blood test strip Use as instructed. Inject into the skin twice daily. E11.65 06/07/20   Gildardo Pounds, NP  ibuprofen (ADVIL) 600 MG tablet Take 1 tablet (600 mg total) by mouth every 6 (six) hours as needed for moderate pain or cramping. 09/01/19   Banga, Bonnee Quin, DO  Lancet Devices (ONE TOUCH DELICA LANCING DEV) MISC Use as instructed. Inject into the skin twice daily E11.65 06/07/20   Gildardo Pounds, NP  levothyroxine (SYNTHROID) 137 MCG tablet Take 137 mcg by mouth every morning. 05/01/20   [provider]  LINZESS 145 MCG CAPS capsule TAKE 1 CAPSULE BY MOUTH ONCE DAILY BEFORE BREAKFAST 09/10/20   Nandigam, Venia Minks, MD  MOBIC 15 MG tablet Take 1 tablet by mouth once a day  take daily for two weeks, and then only as needed afterwards. 05/23/20   [provider]  nitrofurantoin, macrocrystal-monohydrate, (MACROBID) 100 MG capsule Take 1 capsule (100 mg total) by mouth 2 (two) times daily. 06/18/20   Rodriguez-Southworth, Sunday Spillers,  PA-C  pantoprazole (PROTONIX) 40 MG tablet Take 1 tablet (40 mg total) by mouth daily. 06/07/20 09/05/20  Gildardo Pounds, NP  omeprazole (PRILOSEC) 20 MG capsule Take 1 capsule (20 mg total) by mouth daily. Patient not taking: Reported on 03/13/2015 12/26/14 03/13/15  Serita Grit, MD    Family History Family History  Problem Relation Age of Onset  . Diabetes Mother   . Heart disease Mother   . Stroke Mother   . Bipolar disorder Father   . Colon polyps Father        unknown number  . Schizophrenia Brother   . Ovarian cancer Sister 17       or other gynecologic cancer  . Diverticulitis Maternal Aunt   . Stomach cancer Maternal Grandmother        dx ?  Marland Kitchen Heart attack Maternal Grandmother   . Colon polyps Other 25       >  80 polyps (6th degree relative - mother's cousin's grandson)  . Heart disease Brother   . Heart disease Half-Brother   . Lung cancer Cousin        dx 74s (maternal first cousin)  . Breast cancer Cousin        dx 60s/70s (maternal first cousin)  . Esophageal cancer Neg Hx   . Rectal cancer Neg Hx     Social History Social History   Tobacco Use  . Smoking status: Current Every Day Smoker    Packs/day: 0.25    Years: 25.00    Pack years: 6.25    Types: Cigarettes  . Smokeless tobacco: Never Used  . Tobacco comment: 6 cig per day  Vaping Use  . Vaping Use: Never used  Substance Use Topics  . Alcohol use: Yes    Alcohol/week: 6.0 standard drinks    Types: 6 Cans of beer per week  . Drug use: Never     Allergies   Gabapentin, Metformin and related, Sulfamethoxazole, and Other   Review of Systems Review of Systems  Constitutional: Negative for appetite change, chills and fever.  HENT: Negative for congestion, ear pain, rhinorrhea, sinus pressure, sinus pain and sore throat.   Eyes: Negative for redness and visual disturbance.  Respiratory: Negative for cough, chest tightness, shortness of breath and wheezing.   Cardiovascular: Negative for chest  pain and palpitations.  Gastrointestinal: Negative for abdominal pain, constipation, diarrhea, nausea and vomiting.  Genitourinary: Negative for dysuria, frequency and urgency.  Musculoskeletal: Positive for myalgias.  Neurological: Negative for dizziness, weakness and headaches.  Psychiatric/Behavioral: Negative for confusion.  All other systems reviewed and are negative.    Physical Exam Triage Vital Signs ED Triage Vitals [09/15/20 1029]  Enc Vitals Group     BP (!) 143/88     Pulse Rate 87     Resp 18     Temp 98.7 F (37.1 C)     Temp Source Oral     SpO2 98 %     Weight      Height      Head Circumference      Peak Flow      Pain Score 10     Pain Loc      Pain Edu?      Excl. in [place]?    No data found.  Updated Vital Signs BP (!) 143/88 (BP Location: Right Arm)   Pulse 87   Temp 98.7 F (37.1 C) (Oral)   Resp 18   SpO2 98%   Visual Acuity Right Eye Distance:   Left Eye Distance:   Bilateral Distance:    Right Eye Near:   Left Eye Near:    Bilateral Near:     Physical Exam Vitals reviewed.  Constitutional:      General: She is not in acute distress.    Appearance: Normal appearance. She is not ill-appearing.  HENT:     Head: Normocephalic and atraumatic.     Right Ear: Hearing, tympanic membrane, ear canal and external ear normal. No swelling or tenderness. There is no impacted cerumen. No mastoid tenderness. Tympanic membrane is not perforated, erythematous, retracted or bulging.     Left Ear: Hearing, tympanic membrane, ear canal and external ear normal. No swelling or tenderness. There is no impacted cerumen. No mastoid tenderness. Tympanic membrane is not perforated, erythematous, retracted or bulging.     Nose:     Right Sinus: No maxillary sinus tenderness or frontal sinus  tenderness.     Left Sinus: No maxillary sinus tenderness or frontal sinus tenderness.     Mouth/Throat:     Mouth: Mucous membranes are moist.     Pharynx: Uvula midline. No  oropharyngeal exudate or posterior oropharyngeal erythema.     Tonsils: No tonsillar exudate.  Eyes:     Extraocular Movements: Extraocular movements intact.     Pupils: Pupils are equal, round, and reactive to light.  Cardiovascular:     Rate and Rhythm: Normal rate and regular rhythm.     Heart sounds: Normal heart sounds.  Pulmonary:     Breath sounds: Normal breath sounds and air entry. No wheezing, rhonchi or rales.  Chest:     Chest wall: No tenderness.  Abdominal:     General: Abdomen is flat. Bowel sounds are normal.     Tenderness: There is no abdominal tenderness. There is no guarding or rebound.  Lymphadenopathy:     Cervical: No cervical adenopathy.  Skin:    Capillary Refill: Capillary refill takes less than 2 seconds.  Neurological:     General: No focal deficit present.     Mental Status: She is alert and oriented to person, place, and time.     Cranial Nerves: Cranial nerves are intact. No cranial nerve deficit.     Sensory: Sensation is intact.     Motor: Motor function is intact. No weakness.     Coordination: Coordination is intact.     Gait: Gait is intact. Gait normal.     Comments: CN 2-12 grossly intact  Psychiatric:        Attention and Perception: Attention and perception normal.        Mood and Affect: Mood and affect normal.        Behavior: Behavior normal. Behavior is cooperative.        Thought Content: Thought content normal.        Judgment: Judgment normal.      UC Treatments / Results  Labs (all labs ordered are listed, but only abnormal results are displayed) Labs Reviewed - No data to display  EKG   Radiology No results found.  Procedures Procedures (including critical care time)  Medications Ordered in UC Medications - No data to display  Initial Impression / Assessment and Plan / UC Course  I have reviewed the triage vital signs and the nursing notes.  Pertinent labs & imaging results that were available during my care of  the patient were reviewed by me and considered in my medical decision making (see chart for details).     This patient is a 65 year old female presenting with fibromyalgia and L ear irritation due to headset use. Today this pt is afebrile nontachycardic nontachypneic, oxygenating well on room air, no wheezes rhonchi or rales. Benign neuro exam.   For fibromyalgia, continue amitriptyline. Trial of zanaflex for acute bodyaches.   Prednisone sent for L ear irritation/effusion. No infection present.  F/u with PCP for improved management of fibromyalgia.  ED return precautions discussed.   Final Clinical Impressions(s) / UC Diagnoses   Final diagnoses:  Fibromyalgia  Acute effusion of left ear     Discharge Instructions     -Start the muscle relaxer-Zanaflex (tizanidine), up to 3 times daily for muscle spasms and pain.  This can make you drowsy, so take at bedtime or when you do not need to drive or operate machinery. -Prednisone 1 pill daily for 5 days in a row. I recommend taking this  in the morning as it could give you energy.  Try to avoid taking NSAIDs while on this medication, like ibuprofen or Aleve, as this can increase your chances of gastritis or stomach issues. -You can take Tylenol 1000 mg up to 3 times daily for additional relief. -Follow-up with your primary care provider for titration of the amitriptyline and improved control of fibromyalgia.     ED Prescriptions    Medication Sig Dispense Auth. Provider   predniSONE (DELTASONE) 20 MG tablet Take 1 tablet (20 mg total) by mouth daily for 5 days. 5 tablet Hazel Sams, PA-C   tizanidine (ZANAFLEX) 2 MG capsule Take 1 capsule (2 mg total) by mouth 3 (three) times daily. 21 capsule Hazel Sams, PA-C     PDMP not reviewed this encounter.   Hazel Sams, PA-C 09/15/20 1212

## 2020-09-19 ENCOUNTER — Other Ambulatory Visit: Payer: Self-pay | Admitting: Family Medicine

## 2020-09-19 NOTE — Telephone Encounter (Signed)
Requested Prescriptions  Pending Prescriptions Disp Refills  . glipiZIDE (GLUCOTROL XL) 5 MG 24 hr tablet [Pharmacy Med Name: glipiZIDE ER 5 MG Oral Tablet Extended Release 24 Hour] 30 tablet 0    Sig: Take 1 tablet by mouth once daily with breakfast     Endocrinology:  Diabetes - Sulfonylureas Failed - 09/19/2020  9:01 AM      Failed - HBA1C is between 0 and 7.9 and within 180 days    Hemoglobin A1C  Date Value Ref Range Status  06/07/2020 8.4 (A) 4.0 - 5.6 % Final   Hgb A1c MFr Bld  Date Value Ref Range Status  05/17/2009 7.0 %          Passed - Valid encounter within last 6 months    Recent Outpatient Visits          1 month ago Type 2 diabetes mellitus with diabetic neuropathy, without long-term current use of insulin ([place])   [place], Annie Main L, RPH-CPP   2 months ago Type 2 diabetes mellitus with diabetic neuropathy, without long-term current use of insulin [facility])   [place], Annie Main L, RPH-CPP   3 months ago Type 2 diabetes mellitus with diabetic neuropathy, without long-term current use of insulin [facility])   [place], [place] W, NP   4 years ago Constipation, unspecified constipation type   Primary Care at [facility], Gelene Mink, PA-C   6 years ago HAND PAIN, RIGHT   [facility] at [facility], Laurey Arrow, MD      Future Appointments            In 4 weeks Gildardo Pounds, NP [facility]

## 2020-10-18 ENCOUNTER — Ambulatory Visit: Payer: Commercial Managed Care - PPO | Admitting: Nurse Practitioner

## 2020-10-24 ENCOUNTER — Other Ambulatory Visit: Payer: Self-pay | Admitting: Family Medicine

## 2020-11-27 ENCOUNTER — Other Ambulatory Visit: Payer: Self-pay | Admitting: Family Medicine

## 2020-11-27 NOTE — Telephone Encounter (Signed)
Requested medication (s) are due for refill today:   Yes  Requested medication (s) are on the active medication list:   Yes  Future visit scheduled:   No     Last ordered: 10/24/2020 #30, 0 refills  Returned because do not see a record of her seeing a provider other than the pharmacist.  She was a No Show for appt. On 10/18/2020 with Dr. Margarita Rana.    Dr. Margarita Rana to review for refill.   Requested Prescriptions  Pending Prescriptions Disp Refills   glipiZIDE (GLUCOTROL XL) 5 MG 24 hr tablet [Pharmacy Med Name: glipiZIDE ER 5 MG Oral Tablet Extended Release 24 Hour] 30 tablet 0    Sig: Take 1 tablet by mouth once daily with breakfast      Endocrinology:  Diabetes - Sulfonylureas Failed - 11/27/2020  9:15 AM      Failed - HBA1C is between 0 and 7.9 and within 180 days    Hemoglobin A1C  Date Value Ref Range Status  06/07/2020 8.4 (A) 4.0 - 5.6 % Final   Hgb A1c MFr Bld  Date Value Ref Range Status  05/17/2009 7.0 %           Passed - Valid encounter within last 6 months    Recent Outpatient Visits           4 months ago Type 2 diabetes mellitus with diabetic neuropathy, without long-term current use of insulin ([place])   [place], Stephen L, RPH-CPP   4 months ago Type 2 diabetes mellitus with diabetic neuropathy, without long-term current use of insulin [facility])   [place], Annie Main L, RPH-CPP   5 months ago Type 2 diabetes mellitus with diabetic neuropathy, without long-term current use of insulin [facility])   [place], [place] W, NP   4 years ago Constipation, unspecified constipation type   Primary Care at [facility], Gelene Mink, PA-C   7 years ago HAND PAIN, RIGHT   [facility] at [facility], Laurey Arrow, MD

## 2020-12-21 ENCOUNTER — Emergency Department (HOSPITAL_COMMUNITY): Payer: Self-pay

## 2020-12-21 ENCOUNTER — Emergency Department (HOSPITAL_COMMUNITY)
Admission: EM | Admit: 2020-12-21 | Discharge: 2020-12-21 | Disposition: A | Discharge status: Home / Self Care | Payer: Self-pay | Attending: Emergency Medicine | Admitting: Emergency Medicine

## 2020-12-21 ENCOUNTER — Other Ambulatory Visit: Payer: Self-pay

## 2020-12-21 ENCOUNTER — Encounter (HOSPITAL_COMMUNITY): Payer: Self-pay | Admitting: Emergency Medicine

## 2020-12-21 DIAGNOSIS — E1140 Type 2 diabetes mellitus with diabetic neuropathy, unspecified: Secondary | ICD-10-CM | POA: Insufficient documentation

## 2020-12-21 DIAGNOSIS — F17210 Nicotine dependence, cigarettes, uncomplicated: Secondary | ICD-10-CM | POA: Insufficient documentation

## 2020-12-21 DIAGNOSIS — Z7984 Long term (current) use of oral hypoglycemic drugs: Secondary | ICD-10-CM | POA: Insufficient documentation

## 2020-12-21 DIAGNOSIS — M79675 Pain in left toe(s): Secondary | ICD-10-CM | POA: Insufficient documentation

## 2020-12-21 DIAGNOSIS — W228XXA Striking against or struck by other objects, initial encounter: Secondary | ICD-10-CM | POA: Insufficient documentation

## 2020-12-21 DIAGNOSIS — E039 Hypothyroidism, unspecified: Secondary | ICD-10-CM | POA: Insufficient documentation

## 2020-12-21 DIAGNOSIS — Z79899 Other long term (current) drug therapy: Secondary | ICD-10-CM | POA: Insufficient documentation

## 2020-12-21 NOTE — ED Triage Notes (Addendum)
Pt states that she hit her L 4thtoe on the bed 3 nights ago and now has pain and swelling. Alert and oriented.

## 2020-12-21 NOTE — Discharge Instructions (Addendum)
As we discussed it appears that from being constantly moist there is the very early stages of skin breakdown in between your toes.  Please make sure you are keeping these areas clean and dry.  Your x-rays today did not show any evidence of a broken bone and I suspect that you have a bad bruise with swelling or have injured some of the soft tissues and connective tissues in your foot. Please wear the postop shoe as needed to help pain. Please elevate above your heart whenever possible. Please take Tylenol or as directed below.  If that is not controlling your pain you may use Advil also at the lowest doses required.  Lee schedule a follow-up appointment with your primary care doctor early next week. If the redness worsens clearly, you develop fevers or have any new or concerning symptoms please do not hesitate to return to the emergency room in the meantime.   Please take Ibuprofen (Advil, motrin) and Tylenol (acetaminophen) to relieve your pain.    You may take up to 600 MG (3 pills) of normal strength ibuprofen every 8 hours as needed.   You make take tylenol, up to 1,000 mg (two extra strength pills) every 8 hours as needed.   It is safe to take ibuprofen and tylenol at the same time as they work differently.   Do not take more than 3,000 mg tylenol in a 24 hour period (not more than one dose every 8 hours.  Please check all medication labels as many medications such as pain and cold medications may contain tylenol.  Do not drink alcohol while taking these medications.  Do not take other NSAID'S while taking ibuprofen (such as aleve or naproxen).  Please take ibuprofen with food to decrease stomach upset.

## 2020-12-21 NOTE — Progress Notes (Signed)
Orthopedic Tech Progress Note Patient Details:  Melissa Harmon [birth year] [id number]  Ortho Devices Type of Ortho Device: Postop shoe/boot Ortho Device/Splint Location: left Ortho Device/Splint Interventions: Application   Post Interventions Patient Tolerated: Well Instructions Provided: Care of device  [place] Pink 12/21/2020, 6:13 PM

## 2020-12-21 NOTE — ED Provider Notes (Signed)
[facility] DEPT Provider Note   CSN: [id number] Arrival date & time: 12/21/20  1526     History Chief Complaint  Patient presents with   Toe Pain    Melissa Harmon is a 65 y.o. female with a past medical history of DM, HLD, fibromyalgia.  Who presents today for evaluation of pain in the left toe.  She states that about 3 days ago she hit her left foot on a bed frame.  She noted some mild redness around the foot today.  Her pain is worse over the third and fourth toes.  She does have a history of diabetes.  Reports that she has been soaking her foot in Epsom salt with mild relief and putting Neosporin on her toes.  She denies any known wounds.  No fevers and otherwise feels well.  HPI     Past Medical History:  Diagnosis Date   Bronchitis    Carpal tunnel syndrome on both sides    Family history of breast cancer    Family history of colonic polyps    Family history of lung cancer    Family history of ovarian cancer    Family history of stomach cancer    Fibromyalgia    GERD (gastroesophageal reflux disease)    History of chronic bronchitis    History of Hashimoto thyroiditis    s/p  bilateral thryoidectomy 03/ 2001   Hyperlipidemia    Hypothyroidism, postsurgical followed by pcp   08-04-1999 s/p  bilateral thyroidectomy for multinodular goitar (per path hashimoto's thyroiditis)   Irritable bowel syndrome with constipation    MDD (major depressive disorder)    Pelvic pain    Right ovarian cyst    Type 2 diabetes mellitus ([place])    followed by pcp  (08-24-2019 pt stated checks blood sugar dialy in am,  fasting-- 159-175)   Wears contact lenses    Wears dentures    upper    Patient Active Problem List   Diagnosis Date Noted   Diabetes mellitus with neuropathy ([place]) 06/07/2020   Genetic testing 05/22/2020   Family history of colonic polyps    Family history of ovarian cancer    Family history of stomach cancer    Family history of lung  cancer    Family history of breast cancer    Adjustment disorder with anxious mood 09/18/2017   MDD (major depressive disorder), recurrent episode, moderate ([place]) 09/18/2017   Insomnia due to other mental disorder 09/18/2017   DYSURIA 10/25/2009   ABDOMINAL PAIN, RIGHT UPPER QUADRANT 10/25/2009   NECK PAIN, LEFT 08/23/2009   HAND PAIN, RIGHT 08/23/2009   ELBOW PAIN, BILATERAL 05/17/2009   TOBACCO USER 05/13/2009   DIABETES MELLITUS, TYPE II, WITHOUT COMPLICATIONS 29/52/8413   DYSLIPIDEMIA 12/14/2008   SHORTNESS OF BREATH 12/14/2008   RECTAL BLEEDING 04/22/2007   COLONIC POLYPS, HX OF 04/22/2007   BLOOD IN STOOL 04/15/2007   PITYRIASIS ROSEA 08/09/2006   HYPOTHYROIDISM, UNSPECIFIED 07/08/2006   GASTROESOPHAGEAL REFLUX, NO ESOPHAGITIS 07/08/2006   FIBROMYALGIA, FIBROMYOSITIS 07/08/2006    Past Surgical History:  Procedure Laterality Date   [place]  last one 05-25-2018   LAPAROSCOPIC UNILATERAL SALPINGO OOPHERECTOMY Bilateral 09/01/2019   Procedure: LAPAROSCOPIC BILATERAL SALPINGO OOPHORECTOMY;  Surgeon: Sherlyn Hay, DO;  Location: [facility];  Service: Gynecology;  Laterality: Bilateral;   THYROIDECTOMY Bilateral 08-04-1999  dr Bubba Camp  @[facility]    subtotal   UPPER GASTROINTESTINAL ENDOSCOPY  OB History   No obstetric history on file.     Family History  Problem Relation Age of Onset   Diabetes Mother    Heart disease Mother    Stroke Mother    Bipolar disorder Father    Colon polyps Father        unknown number   Schizophrenia Brother    Ovarian cancer Sister 66       or other gynecologic cancer   Diverticulitis Maternal Aunt    Stomach cancer Maternal Grandmother        dx ?   Heart attack Maternal Grandmother    Colon polyps Other 25       >80 polyps (6th degree relative - mother's cousin's grandson)   Heart disease Brother    Heart disease Half-Brother    Lung cancer Cousin        dx 84s (maternal first  cousin)   Breast cancer Cousin        dx 60s/70s (maternal first cousin)   Esophageal cancer Neg Hx    Rectal cancer Neg Hx     Social History   Tobacco Use   Smoking status: Every Day    Packs/day: 0.25    Years: 25.00    Pack years: 6.25    Types: Cigarettes   Smokeless tobacco: Never   Tobacco comments:    6 cig per day  Vaping Use   Vaping Use: Never used  Substance Use Topics   Alcohol use: Yes    Alcohol/week: 6.0 standard drinks    Types: 6 Cans of beer per week   Drug use: Never    Home Medications Prior to Admission medications   Medication Sig Start Date End Date Taking? Authorizing Provider  albuterol (VENTOLIN HFA) 108 (90 Base) MCG/ACT inhaler Inhale 2 puffs into the lungs every 6 (six) hours as needed for wheezing. 06/07/20   Gildardo Pounds, NP  amitriptyline (ELAVIL) 10 MG tablet Take 1 tablet (10 mg total) by mouth at bedtime. 08/12/20 09/11/20  Gildardo Pounds, NP  atorvastatin (LIPITOR) 40 MG tablet Take 1 tablet (40 mg total) by mouth daily. 06/07/20 09/05/20  Gildardo Pounds, NP  Blood Glucose Monitoring Suppl (ONETOUCH VERIO) w/Device KIT Use as instructed. Check blood glucose level by fingerstick twice per day. E11.65 06/07/20   Gildardo Pounds, NP  docusate sodium (COLACE) 100 MG capsule Take 100 mg by mouth 2 (two) times daily as needed for mild constipation.    [provider]  fluconazole (DIFLUCAN) 150 MG tablet Take 1 tablet (150 mg total) by mouth daily. 06/18/20   Rodriguez-Southworth, Sunday Spillers, PA-C  glipiZIDE (GLUCOTROL XL) 5 MG 24 hr tablet Take 1 tablet by mouth once daily with breakfast 10/24/20   Charlott Rakes, MD  glucose blood test strip Use as instructed. Inject into the skin twice daily. E11.65 06/07/20   Gildardo Pounds, NP  ibuprofen (ADVIL) 600 MG tablet Take 1 tablet (600 mg total) by mouth every 6 (six) hours as needed for moderate pain or cramping. 09/01/19   Banga, Bonnee Quin, DO  Lancet Devices (ONE TOUCH DELICA LANCING DEV)  MISC Use as instructed. Inject into the skin twice daily E11.65 06/07/20   Gildardo Pounds, NP  levothyroxine (SYNTHROID) 137 MCG tablet Take 137 mcg by mouth every morning. 05/01/20   [provider]  LINZESS 145 MCG CAPS capsule TAKE 1 CAPSULE BY MOUTH ONCE DAILY BEFORE BREAKFAST 09/10/20   Mauri Pole, MD  [facility]  15 MG tablet Take 1 tablet by mouth once a day  take daily for two weeks, and then only as needed afterwards. 05/23/20   [provider]  nitrofurantoin, macrocrystal-monohydrate, (MACROBID) 100 MG capsule Take 1 capsule (100 mg total) by mouth 2 (two) times daily. 06/18/20   Rodriguez-Southworth, Sunday Spillers, PA-C  pantoprazole (PROTONIX) 40 MG tablet Take 1 tablet (40 mg total) by mouth daily. 06/07/20 09/05/20  Gildardo Pounds, NP  tizanidine (ZANAFLEX) 2 MG capsule Take 1 capsule (2 mg total) by mouth 3 (three) times daily. 09/15/20   Hazel Sams, PA-C  omeprazole (PRILOSEC) 20 MG capsule Take 1 capsule (20 mg total) by mouth daily. Patient not taking: Reported on 03/13/2015 12/26/14 03/13/15  Serita Grit, MD    Allergies    Gabapentin, Metformin and related, Sulfamethoxazole, and Other  Review of Systems   Review of Systems  Constitutional:  Negative for chills and fever.  Musculoskeletal:        Pain in foot  Skin:  Positive for color change. Negative for rash and wound.  Allergic/Immunologic: Negative for immunocompromised state.  All other systems reviewed and are negative.  Physical Exam Updated Vital Signs BP (!) 148/77 (BP Location: Right Arm)   Pulse 76   Temp 98.5 F (36.9 C) (Oral)   Resp 16   Ht 5' 5"  (1.651 m)   Wt 76.7 kg   SpO2 99%   BMI 28.12 kg/m   Physical Exam Vitals and nursing note reviewed.  Constitutional:      General: She is not in acute distress.    Appearance: She is not ill-appearing.  HENT:     Head: Normocephalic.  Cardiovascular:     Rate and Rhythm: Normal rate.     Comments: 2+ left dp/pt pulse Pulmonary:      Effort: Pulmonary effort is normal. No respiratory distress.  Musculoskeletal:     Comments: TTP over the left 3rd and 4th toes.  No edema or creptitis.   Skin:    Comments: Scant breakdown and maceration in between the left 3rd and 4th toes.  Questionable redness but no edema  Neurological:     Mental Status: She is alert.       ED Results / Procedures / Treatments   Labs (all labs ordered are listed, but only abnormal results are displayed) Labs Reviewed - No data to display  EKG None  Radiology DG Foot Complete Left  Result Date: 12/21/2020 CLINICAL DATA:  Pain in the third and fourth toes after hitting foot on a bed 3 days ago. EXAM: LEFT FOOT - COMPLETE 3+ VIEW COMPARISON:  None. FINDINGS: There is no evidence of fracture or dislocation. There is no evidence of arthropathy or other focal bone abnormality. Soft tissues are unremarkable. IMPRESSION: Negative. Electronically Signed   By: Zerita Boers M.D.   On: 12/21/2020 17:06    Procedures Procedures   Medications Ordered in ED Medications - No data to display  ED Course  I have reviewed the triage vital signs and the nursing notes.  Pertinent labs & imaging results that were available during my care of the patient were reviewed by me and considered in my medical decision making (see chart for details).    MDM Rules/Calculators/A&P                          Patient is a 65 year old woman who presents today for evaluation of pain in her left foot after  she hit her left foot on the bed about 3 nights ago.  X-rays obtained without fracture or other abnormality.  She does have some questionable erythema around the base of the painful toes.  She has very mild skin maceration in between her toes.  Recommended conservative care including keeping the area clean and dry.  She is given a postop shoe.  Work note given, OTC meds as needed for pain. She does not clearly have any signs of infection at this time, recommended close  outpatient follow-up.  Return precautions were discussed with patient who states their understanding.  At the time of discharge patient denied any unaddressed complaints or concerns.  Patient is agreeable for discharge home.  Note: Portions of this report may have been transcribed using voice recognition software. Every effort was made to ensure accuracy; however, inadvertent computerized transcription errors may be present   Final Clinical Impression(s) / ED Diagnoses Final diagnoses:  Pain of toe of left foot    Rx / DC Orders ED Discharge Orders     None        Ollen Gross 12/22/20 0024    Drenda Freeze, MD 12/22/20 1501

## 2021-04-11 ENCOUNTER — Other Ambulatory Visit: Payer: Self-pay | Admitting: Nurse Practitioner

## 2021-04-11 DIAGNOSIS — Z1231 Encounter for screening mammogram for malignant neoplasm of breast: Secondary | ICD-10-CM

## 2021-04-21 ENCOUNTER — Other Ambulatory Visit: Payer: Self-pay

## 2021-04-21 DIAGNOSIS — Z1231 Encounter for screening mammogram for malignant neoplasm of breast: Secondary | ICD-10-CM

## 2021-05-13 ENCOUNTER — Other Ambulatory Visit: Payer: Self-pay

## 2021-05-13 ENCOUNTER — Ambulatory Visit: Payer: Self-pay | Admitting: *Deleted

## 2021-05-13 VITALS — BP 140/88 | Wt 166.2 lb

## 2021-05-13 DIAGNOSIS — Z01419 Encounter for gynecological examination (general) (routine) without abnormal findings: Secondary | ICD-10-CM

## 2021-05-13 NOTE — Progress Notes (Signed)
[name] is a 66 y.o. No obstetric history on file. female who presents to [facility] clinic today with no complaints.    Pap Smear: Pap smear completed today. Last Pap smear was 11/03/2016 at the [facility] clinic and was normal. Per patient has no history of an abnormal Pap smear. Last Pap smear result is available in Epic.   Physical exam: Breasts Breasts symmetrical. No skin abnormalities bilateral breasts. No nipple retraction bilateral breasts. No nipple discharge bilateral breasts. No lymphadenopathy. No lumps palpated bilateral breasts. No complaints of pain or tenderness on exam.  Pelvic/Bimanual Ext Genitalia No lesions, no swelling and no discharge observed on external genitalia.        Vagina Vagina pink and normal texture. No lesions or discharge observed in vagina.        Cervix Cervix is present. Cervix pink and of normal texture. No discharge observed.    Uterus Uterus is present and palpable. Uterus in normal position and normal size.        Adnexae Bilateral ovaries present and palpable. No tenderness on palpation.         Rectovaginal No rectal exam completed today since patient had no rectal complaints. No skin abnormalities observed on exam.     Smoking History: Patient is a current smoker. Discussed smoking cessation with patient. Referred to the [facility] Quitline and gave resources to the free smoking cessation classes at [facility].   Patient Navigation: Patient education provided. Access to services provided for patient through [facility] program.   Colorectal Cancer Screening: Per patient has had colonoscopy completed on 03/26/2020 that a repeat colonoscopy is recommended in one year for follow-up. Explained the importance of follow-up with patient and told her about the [facility] Application.  No complaints today.    Breast and Cervical Cancer Risk Assessment: Patient has family history of a maternal cousin having breast cancer.  Patient has no known genetic mutations or history of radiation treatment to the chest before age 54. Patient does not have history of cervical dysplasia, immunocompromised, or DES exposure in-utero.  Risk Assessment     Risk Scores       05/13/2021   Last edited by: Royston Bake, CMA   5-year risk: 1.6 %   Lifetime risk: 6.3 %            A: B[facility]CP exam with pap smear No complaints.  P: Referred patient to [facility] for a screening mammogram. Appointment scheduled Friday, May 16, 2021 at [place].  Loletta Parish, RN 05/13/2021 8:43 AM

## 2021-05-13 NOTE — Patient Instructions (Signed)
Explained breast self awareness with DANELLA PHILSON. Pap smear completed today. Let her know B[facility]CP will cover Pap smears and HPV typing every 5 years unless has a history of abnormal Pap smears. Referred patient to [facility] for a screening mammogram. Appointment scheduled Friday, May 16, 2021 at [place]. Patient aware of appointment and will be there. Let patient know will follow up with her within the next couple weeks with results of her Pap smear by phone. Informed patient that [facility] will follow-up with her within next couple of weeks with results of her mammogram by letter or phone. Discussed smoking cessation with patient. Referred to the [facility] Quitline and gave resources to the free smoking cessation classes at [facility]. Jenee Spaugh Al verbalized understanding.  Tiny Chaudhary, Arvil Chaco, RN 8:44 AM

## 2021-05-15 ENCOUNTER — Telehealth: Payer: Self-pay

## 2021-05-15 LAB — CYTOLOGY - PAP
Comment: NEGATIVE
Diagnosis: NEGATIVE
High risk HPV: NEGATIVE

## 2021-05-15 NOTE — Addendum Note (Signed)
Addended by: Demetrius Revel on: 05/15/2021 12:34 PM   Modules accepted: Orders

## 2021-05-15 NOTE — Telephone Encounter (Signed)
Called patient to give pap smear results. Informed patient that pap smear was normal and HPV was negative. Based on this result and patient's age any future pap smears will be at PCP's discretion.

## 2021-07-08 ENCOUNTER — Encounter: Payer: Self-pay | Admitting: Gastroenterology

## 2021-07-28 ENCOUNTER — Other Ambulatory Visit: Payer: Self-pay

## 2021-07-28 ENCOUNTER — Ambulatory Visit: Payer: Self-pay | Admitting: Podiatrist

## 2021-07-31 NOTE — Progress Notes (Deleted)
? ?Office Visit Note ? ?Patient: Melissa Harmon             ?Date of Birth: May 28, 1955           ?MRN: [id number]             ?PCP: Delford Field, FNP ?Referring: Delford Field, FNP ?Visit Date: 08/14/2021 ?Occupation: '@GUAROCC'$ @ ? ?Subjective:  ?No chief complaint on file. ? ? ?History of Present Illness: Melissa Harmon is a 66 y.o. female ***  ? ?Activities of Daily Living:  ?Patient reports morning stiffness for *** {minute/hour:19697}.   ?Patient {ACTIONS;DENIES/REPORTS:21021675::"Denies"} nocturnal pain.  ?Difficulty dressing/grooming: {ACTIONS;DENIES/REPORTS:21021675::"Denies"} ?Difficulty climbing stairs: {ACTIONS;DENIES/REPORTS:21021675::"Denies"} ?Difficulty getting out of chair: {ACTIONS;DENIES/REPORTS:21021675::"Denies"} ?Difficulty using hands for taps, buttons, cutlery, and/or writing: {ACTIONS;DENIES/REPORTS:21021675::"Denies"} ? ?No Rheumatology ROS completed.  ? ?PMFS History:  ?Patient Active Problem List  ? Diagnosis Date Noted  ? Diabetes mellitus with neuropathy ([place]) 06/07/2020  ? Genetic testing 05/22/2020  ? Family history of colonic polyps   ? Family history of ovarian cancer   ? Family history of stomach cancer   ? Family history of lung cancer   ? Family history of breast cancer   ? Adjustment disorder with anxious mood 09/18/2017  ? MDD (major depressive disorder), recurrent episode, moderate ([place]) 09/18/2017  ? Insomnia due to other mental disorder 09/18/2017  ? DYSURIA 10/25/2009  ? ABDOMINAL PAIN, RIGHT UPPER QUADRANT 10/25/2009  ? NECK PAIN, LEFT 08/23/2009  ? HAND PAIN, RIGHT 08/23/2009  ? ELBOW PAIN, BILATERAL 05/17/2009  ? TOBACCO USER 05/13/2009  ? DIABETES MELLITUS, TYPE II, WITHOUT COMPLICATIONS 28/36/6294  ? DYSLIPIDEMIA 12/14/2008  ? SHORTNESS OF BREATH 12/14/2008  ? RECTAL BLEEDING 04/22/2007  ? COLONIC POLYPS, HX OF 04/22/2007  ? BLOOD IN STOOL 04/15/2007  ? PITYRIASIS ROSEA 08/09/2006  ? HYPOTHYROIDISM, UNSPECIFIED 07/08/2006  ? GASTROESOPHAGEAL REFLUX, NO  ESOPHAGITIS 07/08/2006  ? FIBROMYALGIA, FIBROMYOSITIS 07/08/2006  ?  ?Past Medical History:  ?Diagnosis Date  ? Bronchitis   ? Carpal tunnel syndrome on both sides   ? Family history of breast cancer   ? Family history of colonic polyps   ? Family history of lung cancer   ? Family history of ovarian cancer   ? Family history of stomach cancer   ? Fibromyalgia   ? GERD (gastroesophageal reflux disease)   ? History of chronic bronchitis   ? History of Hashimoto thyroiditis   ? s/p  bilateral thryoidectomy 03/ 2001  ? Hyperlipidemia   ? Hypothyroidism, postsurgical followed by pcp  ? 08-04-1999 s/p  bilateral thyroidectomy for multinodular goitar (per path hashimoto's thyroiditis)  ? Irritable bowel syndrome with constipation   ? MDD (major depressive disorder)   ? Pelvic pain   ? Right ovarian cyst   ? Type 2 diabetes mellitus ([place])   ? followed by pcp  (08-24-2019 pt stated checks blood sugar dialy in am,  fasting-- 159-175)  ? Wears contact lenses   ? Wears dentures   ? upper  ?  ?Family History  ?Problem Relation Age of Onset  ? Diabetes Mother   ? Heart disease Mother   ? Stroke Mother   ? Bipolar disorder Father   ? Colon polyps Father   ?     unknown number  ? Schizophrenia Brother   ? Ovarian cancer Sister 23  ?     or other gynecologic cancer  ? Diverticulitis Maternal Aunt   ? Stomach cancer Maternal Grandmother   ?     dx ?  ?  Heart attack Maternal Grandmother   ? Colon polyps Other 25  ?     >80 polyps (6th degree relative - mother's cousin's grandson)  ? Heart disease Brother   ? Heart disease Half-Brother   ? Lung cancer Cousin   ?     dx 50s (maternal first cousin)  ? Breast cancer Cousin   ?     dx 60s/70s (maternal first cousin)  ? Esophageal cancer Neg Hx   ? Rectal cancer Neg Hx   ? ?Past Surgical History:  ?Procedure Laterality Date  ? [place]  ? COLONOSCOPY  last one 05-25-2018  ? LAPAROSCOPIC UNILATERAL SALPINGO OOPHERECTOMY Bilateral 09/01/2019  ? Procedure: LAPAROSCOPIC BILATERAL  SALPINGO OOPHORECTOMY;  Surgeon: Sherlyn Hay, DO;  Location: [facility];  Service: Gynecology;  Laterality: Bilateral;  ? THYROIDECTOMY Bilateral 08-04-1999  dr Bubba Camp  '@[facility]'$   ? subtotal  ? UPPER GASTROINTESTINAL ENDOSCOPY    ? ?Social History  ? ?Social History Narrative  ? Social Hx:  ? Current living situation- lives in [place] alone  ? Born in [place] and raised in [place] by mom and dad  ? Siblings 3 living siblings but total 5 (pt is number the youngest)  ? Schooling- HS grad  ? 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 Married- divorced 1 yr ago but was separated for several years  ? Kids- 3  ? Legal issues- denies  ? ?Immunization History  ?Administered Date(s) Administered  ? Hepb-cpg 06/21/2018, 07/21/2018  ? Influenza Whole 04/22/2007, 03/08/2009  ? PFIZER(Purple Top)SARS-COV-2 Vaccination 07/21/2019, 08/11/2019  ? Td 06/27/2008  ?  ? ?Objective: ?Vital Signs: There were no vitals taken for this visit.  ? ?Physical Exam  ? ?Musculoskeletal Exam: *** ? ?CDAI Exam: ?CDAI Score: -- ?Patient Global: --; Provider Global: -- ?Swollen: --; Tender: -- ?Joint Exam 08/14/2021  ? ?No joint exam has been documented for this visit  ? ?There is currently no information documented on the homunculus. Go to the Rheumatology activity and complete the homunculus joint exam. ? ?Investigation: ?No additional findings. ? ?Imaging: ?No results found. ? ?Recent Labs: ?Lab Results  ?Component Value Date  ? WBC 9.6 05/26/2016  ? HGB 16.0 (H) 09/01/2019  ? PLT 382 03/13/2015  ? NA 141 06/07/2020  ? K 4.6 06/07/2020  ? CL 105 06/07/2020  ? CO2 23 06/07/2020  ? GLUCOSE 160 (H) 06/07/2020  ? BUN 8 06/07/2020  ? CREATININE 0.78 06/07/2020  ? BILITOT 0.5 11/14/2018  ? ALKPHOS 69 11/14/2018  ? AST 16 11/14/2018  ? ALT 30 11/14/2018  ? PROT 6.9 11/14/2018  ? ALBUMIN 4.0 11/14/2018  ? CALCIUM 9.5 06/07/2020  ? GFRAA 94 06/07/2020  ? ? ?Speciality Comments: No specialty comments  available. ? ?Procedures:  ?No procedures performed ?Allergies: Gabapentin, Metformin and related, Sulfamethoxazole, and Other  ? ?Assessment / Plan:     ?Visit Diagnoses: Pain in both hands ? ?History of rheumatoid arthritis ? ?Orders: ?No orders of the defined types were placed in this encounter. ? ?No orders of the defined types were placed in this encounter. ? ? ?Face-to-face time spent with patient was *** minutes. Greater than 50% of time was spent in counseling and coordination of care. ? ?Follow-Up Instructions: No follow-ups on file. ? ? ?Ofilia Neas, PA-C ? ?Note - This record has been created using [employer].  ?Chart creation errors have been sought, but may not always  ?have been located. Such creation errors do  not reflect on  ?the standard of medical care. ? ?

## 2021-08-14 ENCOUNTER — Ambulatory Visit: Payer: Self-pay | Admitting: Rheumatology

## 2021-08-14 DIAGNOSIS — Z720 Tobacco use: Secondary | ICD-10-CM

## 2021-08-14 DIAGNOSIS — F4322 Adjustment disorder with anxiety: Secondary | ICD-10-CM

## 2021-08-14 DIAGNOSIS — E785 Hyperlipidemia, unspecified: Secondary | ICD-10-CM

## 2021-08-14 DIAGNOSIS — Z801 Family history of malignant neoplasm of trachea, bronchus and lung: Secondary | ICD-10-CM

## 2021-08-14 DIAGNOSIS — M797 Fibromyalgia: Secondary | ICD-10-CM

## 2021-08-14 DIAGNOSIS — Z8041 Family history of malignant neoplasm of ovary: Secondary | ICD-10-CM

## 2021-08-14 DIAGNOSIS — Z800 Family history of malignant neoplasm of digestive organs: Secondary | ICD-10-CM

## 2021-08-14 DIAGNOSIS — Z86010 Personal history of colonic polyps: Secondary | ICD-10-CM

## 2021-08-14 DIAGNOSIS — Z8739 Personal history of other diseases of the musculoskeletal system and connective tissue: Secondary | ICD-10-CM

## 2021-08-14 DIAGNOSIS — Z8639 Personal history of other endocrine, nutritional and metabolic disease: Secondary | ICD-10-CM

## 2021-08-14 DIAGNOSIS — L42 Pityriasis rosea: Secondary | ICD-10-CM

## 2021-08-14 DIAGNOSIS — Z8371 Family history of colonic polyps: Secondary | ICD-10-CM

## 2021-08-14 DIAGNOSIS — F331 Major depressive disorder, recurrent, moderate: Secondary | ICD-10-CM

## 2021-08-14 DIAGNOSIS — Z803 Family history of malignant neoplasm of breast: Secondary | ICD-10-CM

## 2021-08-14 DIAGNOSIS — Z8719 Personal history of other diseases of the digestive system: Secondary | ICD-10-CM

## 2021-08-14 DIAGNOSIS — F5105 Insomnia due to other mental disorder: Secondary | ICD-10-CM

## 2021-08-14 DIAGNOSIS — M79641 Pain in right hand: Secondary | ICD-10-CM

## 2021-10-23 ENCOUNTER — Ambulatory Visit: Payer: Self-pay | Admitting: Rheumatology

## 2021-11-26 DIAGNOSIS — N949 Unspecified condition associated with female genital organs and menstrual cycle: Secondary | ICD-10-CM | POA: Insufficient documentation

## 2021-11-26 DIAGNOSIS — E1165 Type 2 diabetes mellitus with hyperglycemia: Secondary | ICD-10-CM | POA: Insufficient documentation

## 2021-11-26 DIAGNOSIS — L209 Atopic dermatitis, unspecified: Secondary | ICD-10-CM | POA: Insufficient documentation

## 2021-11-26 DIAGNOSIS — F419 Anxiety disorder, unspecified: Secondary | ICD-10-CM | POA: Insufficient documentation

## 2021-11-26 DIAGNOSIS — K581 Irritable bowel syndrome with constipation: Secondary | ICD-10-CM | POA: Insufficient documentation

## 2021-12-04 ENCOUNTER — Other Ambulatory Visit: Payer: Self-pay | Admitting: Nurse Practitioner

## 2021-12-04 DIAGNOSIS — E1140 Type 2 diabetes mellitus with diabetic neuropathy, unspecified: Secondary | ICD-10-CM

## 2021-12-27 ENCOUNTER — Other Ambulatory Visit: Payer: Self-pay | Admitting: Nurse Practitioner

## 2021-12-27 DIAGNOSIS — E1140 Type 2 diabetes mellitus with diabetic neuropathy, unspecified: Secondary | ICD-10-CM

## 2022-01-02 ENCOUNTER — Other Ambulatory Visit: Payer: Self-pay | Admitting: Nurse Practitioner

## 2022-01-02 DIAGNOSIS — E1140 Type 2 diabetes mellitus with diabetic neuropathy, unspecified: Secondary | ICD-10-CM

## 2022-01-02 NOTE — Telephone Encounter (Signed)
Requested medication (s) are due for refill today: no  Requested medication (s) are on the active medication list: yes  Last refill:  06/07/21  Future visit scheduled: yes  Notes to clinic:  Unable to refill per protocol, request was refused do to pt needing OV, possible duplicate.     Requested Prescriptions  Pending Prescriptions Disp Refills   ONETOUCH VERIO test strip [Pharmacy Med Name: OneTouch Verio In Vitro Strip] 56 each 0    Sig: USE 1 STRIP TO [place] DAILY     Endocrinology: Diabetes - Testing Supplies Failed - 01/02/2022 10:48 AM      Failed - Valid encounter within last 12 months    Recent Outpatient Visits           1 year ago Type 2 diabetes mellitus with diabetic neuropathy, without long-term current use of insulin ([place])   [place], Annie Main L, RPH-CPP   1 year ago Type 2 diabetes mellitus with diabetic neuropathy, without long-term current use of insulin ([place])   [place], Jarome Matin, RPH-CPP   1 year ago Type 2 diabetes mellitus with diabetic neuropathy, without long-term current use of insulin [facility])   [place], [place] W, NP   5 years ago Constipation, unspecified constipation type   [facility], Gelene Mink, PA-C   8 years ago HAND PAIN, RIGHT   [facility] at [facility], Laurey Arrow, MD       Future Appointments             In 2 months Deveshwar, Abel Presto, MD [facility]umat[facility] Of [place] [place]

## 2022-01-23 ENCOUNTER — Ambulatory Visit: Payer: Commercial Managed Care - HMO | Admitting: Podiatry

## 2022-01-29 ENCOUNTER — Encounter (HOSPITAL_COMMUNITY): Payer: Self-pay

## 2022-01-29 ENCOUNTER — Emergency Department (HOSPITAL_COMMUNITY): Payer: Commercial Managed Care - HMO

## 2022-01-29 ENCOUNTER — Emergency Department (HOSPITAL_COMMUNITY)
Admission: EM | Admit: 2022-01-29 | Discharge: 2022-01-29 | Disposition: A | Discharge status: Home / Self Care | Payer: Commercial Managed Care - HMO | Attending: Emergency Medicine | Admitting: Emergency Medicine

## 2022-01-29 ENCOUNTER — Other Ambulatory Visit: Payer: Self-pay

## 2022-01-29 DIAGNOSIS — Y92410 Unspecified street and highway as the place of occurrence of the external cause: Secondary | ICD-10-CM | POA: Insufficient documentation

## 2022-01-29 DIAGNOSIS — F17210 Nicotine dependence, cigarettes, uncomplicated: Secondary | ICD-10-CM | POA: Diagnosis not present

## 2022-01-29 DIAGNOSIS — E039 Hypothyroidism, unspecified: Secondary | ICD-10-CM | POA: Insufficient documentation

## 2022-01-29 DIAGNOSIS — E119 Type 2 diabetes mellitus without complications: Secondary | ICD-10-CM | POA: Insufficient documentation

## 2022-01-29 DIAGNOSIS — S46812A Strain of other muscles, fascia and tendons at shoulder and upper arm level, left arm, initial encounter: Secondary | ICD-10-CM | POA: Insufficient documentation

## 2022-01-29 DIAGNOSIS — S4992XA Unspecified injury of left shoulder and upper arm, initial encounter: Secondary | ICD-10-CM | POA: Diagnosis present
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
## 2022-01-29 NOTE — Discharge Instructions (Signed)
You were evaluated in the Emergency Department and after careful evaluation, we did not find any emergent condition requiring admission or further testing in the hospital.  Your exam/testing today is overall reassuring.  Symptoms seem to be due to muscle strain or spasm.  Recommend rest, Tylenol and Motrin at home for discomfort.  Please return to the Emergency Department if you experience any worsening of your condition.   Thank you for allowing [place] to be a part of your care.
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
## 2022-02-06 ENCOUNTER — Encounter: Payer: Self-pay | Admitting: Podiatry

## 2022-02-06 ENCOUNTER — Ambulatory Visit: Payer: Commercial Managed Care - HMO | Admitting: Podiatry

## 2022-02-06 DIAGNOSIS — Z794 Long term (current) use of insulin: Secondary | ICD-10-CM | POA: Diagnosis not present

## 2022-02-06 DIAGNOSIS — E1140 Type 2 diabetes mellitus with diabetic neuropathy, unspecified: Secondary | ICD-10-CM

## 2022-02-06 DIAGNOSIS — M792 Neuralgia and neuritis, unspecified: Secondary | ICD-10-CM

## 2022-02-06 DIAGNOSIS — E119 Type 2 diabetes mellitus without complications: Secondary | ICD-10-CM

## 2022-02-06 MED ORDER — AMMONIUM LACTATE 12 % EX LOTN: 1.0000 | TOPICAL_LOTION | CUTANEOUS | 0 refills | Status: DC | PRN

## 2022-02-06 MED ORDER — PREGABALIN 75 MG PO CAPS: 75.0000 mg | ORAL_CAPSULE | Freq: Two times a day (BID) | ORAL | 0 refills | Status: DC
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
## 2022-02-06 NOTE — Progress Notes (Signed)
Subjective: Melissa Harmon presents today referred by Delford Field, FNP for diabetic foot evaluation.  Patient relates many year history of diabetes.  Patient denies any history of foot wounds.  Patient hashistory of numbness, tingling, burning, pins/needles sensations.  Past Medical History:  Diagnosis Date   Bronchitis    Carpal tunnel syndrome on both sides    Family history of breast cancer    Family history of colonic polyps    Family history of lung cancer    Family history of ovarian cancer    Family history of stomach cancer    Fibromyalgia    GERD (gastroesophageal reflux disease)    History of chronic bronchitis    History of Hashimoto thyroiditis    s/p  bilateral thryoidectomy 03/ 2001   Hyperlipidemia    Hypothyroidism, postsurgical followed by pcp   08-04-1999 s/p  bilateral thyroidectomy for multinodular goitar (per path hashimoto's thyroiditis)   Irritable bowel syndrome with constipation    MDD (major depressive disorder)    Pelvic pain    Right ovarian cyst    Type 2 diabetes mellitus ([place])    followed by pcp  (08-24-2019 pt stated checks blood sugar dialy in am,  fasting-- 159-175)   Wears contact lenses    Wears dentures    upper    Patient Active Problem List   Diagnosis Date Noted   Diabetes mellitus with neuropathy ([place]) 06/07/2020   Genetic testing 05/22/2020   Family history of colonic polyps    Family history of ovarian cancer    Family history of stomach cancer    Family history of lung cancer    Family history of breast cancer    Adjustment disorder with anxious mood 09/18/2017   MDD (major depressive disorder), recurrent episode, moderate ([place]) 09/18/2017   Insomnia due to other mental disorder 09/18/2017   DYSURIA 10/25/2009   ABDOMINAL PAIN, RIGHT UPPER QUADRANT 10/25/2009   NECK PAIN, LEFT 08/23/2009   HAND PAIN, RIGHT 08/23/2009   ELBOW PAIN, BILATERAL 05/17/2009   TOBACCO USER 05/13/2009   DIABETES MELLITUS, TYPE II,  WITHOUT COMPLICATIONS 16/02/9603   DYSLIPIDEMIA 12/14/2008   SHORTNESS OF BREATH 12/14/2008   RECTAL BLEEDING 04/22/2007   COLONIC POLYPS, HX OF 04/22/2007   BLOOD IN STOOL 04/15/2007   PITYRIASIS ROSEA 08/09/2006   HYPOTHYROIDISM, UNSPECIFIED 07/08/2006   GASTROESOPHAGEAL REFLUX, NO ESOPHAGITIS 07/08/2006   FIBROMYALGIA, FIBROMYOSITIS 07/08/2006    Past Surgical History:  Procedure Laterality Date   [place]  last one 05-25-2018   LAPAROSCOPIC UNILATERAL SALPINGO OOPHERECTOMY Bilateral 09/01/2019   Procedure: LAPAROSCOPIC BILATERAL SALPINGO OOPHORECTOMY;  Surgeon: Sherlyn Hay, DO;  Location: [facility];  Service: Gynecology;  Laterality: Bilateral;   THYROIDECTOMY Bilateral 08-04-1999  dr Bubba Camp  @[facility]    subtotal   UPPER GASTROINTESTINAL ENDOSCOPY      Current Outpatient Medications on File Prior to Visit  Medication Sig Dispense Refill   albuterol (VENTOLIN HFA) 108 (90 Base) MCG/ACT inhaler Inhale 2 puffs into the lungs every 6 (six) hours as needed for wheezing. 18 g 1   amitriptyline (ELAVIL) 10 MG tablet Take 1 tablet (10 mg total) by mouth at bedtime. 30 tablet 3   atorvastatin (LIPITOR) 40 MG tablet Take 1 tablet (40 mg total) by mouth daily. 90 tablet 1   Blood Glucose Monitoring Suppl (ONETOUCH VERIO) w/Device KIT Use as instructed. Check blood glucose level by fingerstick twice per day. E11.65 1 kit 0   cetirizine (  ZYRTEC) 10 MG tablet Take 10 mg by mouth daily.     docusate sodium (COLACE) 100 MG capsule Take 100 mg by mouth 2 (two) times daily as needed for mild constipation.     fluconazole (DIFLUCAN) 150 MG tablet Take 1 tablet (150 mg total) by mouth daily. (Patient not taking: Reported on 05/13/2021) 2 tablet 0   glipiZIDE (GLUCOTROL XL) 5 MG 24 hr tablet Take 1 tablet by mouth once daily with breakfast 30 tablet 0   glucose blood test strip Use as instructed. Inject into the skin twice daily. E11.65 100 each 12    ibuprofen (ADVIL) 600 MG tablet Take 1 tablet (600 mg total) by mouth every 6 (six) hours as needed for moderate pain or cramping. (Patient not taking: Reported on 05/13/2021) 40 tablet 1   Lancet Devices (ONE TOUCH DELICA LANCING DEV) MISC Use as instructed. Inject into the skin twice daily E11.65 1 each prn   levothyroxine (SYNTHROID) 137 MCG tablet Take 137 mcg by mouth every morning.     levothyroxine (SYNTHROID) 175 MCG tablet Take 175 mcg by mouth daily.     LINZESS 145 MCG CAPS capsule TAKE 1 CAPSULE BY MOUTH ONCE DAILY BEFORE BREAKFAST 30 capsule 0   MOBIC 15 MG tablet Take 1 tablet by mouth once a day  take daily for two weeks, and then only as needed afterwards. (Patient not taking: Reported on 05/13/2021)     nitrofurantoin, macrocrystal-monohydrate, (MACROBID) 100 MG capsule Take 1 capsule (100 mg total) by mouth 2 (two) times daily. (Patient not taking: Reported on 05/13/2021) 10 capsule 0   pantoprazole (PROTONIX) 40 MG tablet Take 1 tablet (40 mg total) by mouth daily. 90 tablet 2   tizanidine (ZANAFLEX) 2 MG capsule Take 1 capsule (2 mg total) by mouth 3 (three) times daily. (Patient not taking: Reported on 05/13/2021) 21 capsule 0   [DISCONTINUED] omeprazole (PRILOSEC) 20 MG capsule Take 1 capsule (20 mg total) by mouth daily. (Patient not taking: Reported on 03/13/2015) 28 capsule 1   Current Facility-Administered Medications on File Prior to Visit  Medication Dose Route Frequency Provider Last Rate Last Admin   0.9 %  sodium chloride infusion  500 mL Intravenous Once Nandigam, Venia Minks, MD         Allergies  Allergen Reactions   Gabapentin Other (See Comments)   Metformin And Related Other (See Comments)    GI symptoms; Constipation   Sulfamethoxazole Hives   Other Rash    Social History   Occupational History   Not on file  Tobacco Use   Smoking status: Every Day    Packs/day: 0.25    Years: 25.00    Total pack years: 6.25    Types: Cigarettes   Smokeless tobacco: Never    Tobacco comments:    6 cig per day  Vaping Use   Vaping Use: Never used  Substance and Sexual Activity   Alcohol use: Yes    Alcohol/week: 6.0 standard drinks of alcohol    Types: 6 Cans of beer per week   Drug use: Never   Sexual activity: Not Currently    Birth control/protection: Post-menopausal    Family History  Problem Relation Age of Onset   Diabetes Mother    Heart disease Mother    Stroke Mother    Bipolar disorder Father    Colon polyps Father        unknown number   Schizophrenia Brother    Ovarian cancer Sister 71  or other gynecologic cancer   Diverticulitis Maternal Aunt    Stomach cancer Maternal Grandmother        dx ?   Heart attack Maternal Grandmother    Colon polyps Other 25       >80 polyps (6th degree relative - mother's cousin's grandson)   Heart disease Brother    Heart disease Half-Brother    Lung cancer Cousin        dx 12s (maternal first cousin)   Breast cancer Cousin        dx 40s/70s (maternal first cousin)   Esophageal cancer Neg Hx    Rectal cancer Neg Hx     Immunization History  Administered Date(s) Administered   Hepb-cpg 06/21/2018, 07/21/2018   Influenza Whole 04/22/2007, 03/08/2009   PFIZER(Purple Top)SARS-COV-2 Vaccination 07/21/2019, 08/11/2019, 04/15/2020   Td 06/27/2008    Review of systems: Positive Findings in bold print.  Constitutional:  chills, fatigue, fever, sweats, weight change Communication: Optometrist, sign Ecologist, hand writing, iPad/Android device Head: headaches, head injury Eyes: changes in vision, eye pain, glaucoma, cataracts, macular degeneration, diplopia, glare,  light sensitivity, eyeglasses or contacts, blindness Ears nose mouth throat: hearing impaired, hearing aids,  ringing in ears, deaf, sign language,  vertigo, nosebleeds,  rhinitis,  cold sores, snoring, swollen glands Cardiovascular: HTN, edema, arrhythmia, pacemaker in place, defibrillator in place, chest pain/tightness,  chronic anticoagulation, blood clot, heart failure, MI Peripheral Vascular: leg cramps, varicose veins, blood clots, lymphedema, varicosities Respiratory:  asthma, difficulty breathing, denies congestion, SOB, wheezing, cough, emphysema Gastrointestinal: change in appetite or weight, abdominal pain, constipation, diarrhea, nausea, vomiting, vomiting blood, change in bowel habits, abdominal pain, jaundice, rectal bleeding, hemorrhoids, GERD Genitourinary:  nocturia,  pain on urination, polyuria,  blood in urine, Foley catheter, urinary urgency, ESRD on hemodialysis Musculoskeletal: amputation, cramping, stiff joints, painful joints, decreased joint motion, fractures, OA, gout, hemiplegia, paraplegia, uses cane, wheelchair bound, uses walker, uses rollator Skin: +changes in toenails, color change, dryness, itching, mole changes,  rash, wound(s) Neurological: headaches, numbness in feet, paresthesias in feet, burning in feet, fainting,  seizures, change in speech, migraines, memory problems/poor historian, cerebral palsy, weakness, paralysis, CVA, TIA Endocrine: diabetes, hypothyroidism, hyperthyroidism,  goiter, dry mouth, flushing, heat intolerance, cold intolerance,  excessive thirst, denies polyuria,  nocturia Hematological:  easy bleeding, excessive bleeding, easy bruising, enlarged lymph nodes, on long term blood thinner, history of past transusions Allergy/immunological:  hives, eczema, frequent infections, multiple drug allergies, seasonal allergies, transplant recipient, multiple food allergies Psychiatric:  anxiety, depression, mood disorder, suicidal ideations, hallucinations, insomnia  Objective: There were no vitals filed for this visit. Vascular Examination: Capillary refill time less than 3 seconds x 10 digits.  Dorsalis pedis pulses palpable 2 out of 4.  Posterior tibial pulses 2 out of 4.  Digital hair not present x 10 digits.  Skin temperature gradient WNL  b/l.  Dermatological Examination: Skin with normal turgor, texture and tone b/l  Toenails 1-5 b/l discolored, thick, dystrophic with subungual debris and pain with palpation to nailbeds due to thickness of nails.  Musculoskeletal: Muscle strength 5/5 to all LE muscle groups.  Neurological: Sensation intact with 10 gram monofilament.  Vibratory sensation intact.  Assessment: NIDDM Encounter for diabetic foot examination Neuropathic pain  Plan: Discussed diabetic foot care principles. Literature dispensed on today. Patient to continue soft, supportive shoe gear daily. Patient to report any pedal injuries to medical professional immediately. Follow up one year. Patient/POA to call should there be a concern in the interim. Lyrica  was sent for neuropathic pain.

## 2022-02-20 ENCOUNTER — Ambulatory Visit (INDEPENDENT_AMBULATORY_CARE_PROVIDER_SITE_OTHER): Payer: Commercial Managed Care - HMO

## 2022-02-20 ENCOUNTER — Encounter: Payer: Self-pay | Admitting: Rheumatology

## 2022-02-20 ENCOUNTER — Ambulatory Visit: Payer: Commercial Managed Care - HMO | Attending: Rheumatology | Admitting: Rheumatology

## 2022-02-20 VITALS — BP 152/84 | HR 73 | Resp 14 | Ht 63.0 in | Wt 156.0 lb

## 2022-02-20 DIAGNOSIS — M79641 Pain in right hand: Secondary | ICD-10-CM

## 2022-02-20 DIAGNOSIS — M79672 Pain in left foot: Secondary | ICD-10-CM | POA: Diagnosis not present

## 2022-02-20 DIAGNOSIS — Z8659 Personal history of other mental and behavioral disorders: Secondary | ICD-10-CM

## 2022-02-20 DIAGNOSIS — M79671 Pain in right foot: Secondary | ICD-10-CM

## 2022-02-20 DIAGNOSIS — R5383 Other fatigue: Secondary | ICD-10-CM | POA: Diagnosis not present

## 2022-02-20 DIAGNOSIS — Z8719 Personal history of other diseases of the digestive system: Secondary | ICD-10-CM

## 2022-02-20 DIAGNOSIS — Z803 Family history of malignant neoplasm of breast: Secondary | ICD-10-CM

## 2022-02-20 DIAGNOSIS — Z8041 Family history of malignant neoplasm of ovary: Secondary | ICD-10-CM

## 2022-02-20 DIAGNOSIS — Z8261 Family history of arthritis: Secondary | ICD-10-CM

## 2022-02-20 DIAGNOSIS — E785 Hyperlipidemia, unspecified: Secondary | ICD-10-CM

## 2022-02-20 DIAGNOSIS — M069 Rheumatoid arthritis, unspecified: Secondary | ICD-10-CM

## 2022-02-20 DIAGNOSIS — Z800 Family history of malignant neoplasm of digestive organs: Secondary | ICD-10-CM

## 2022-02-20 DIAGNOSIS — M79642 Pain in left hand: Secondary | ICD-10-CM

## 2022-02-20 DIAGNOSIS — Z860100 Personal history of colon polyps, unspecified: Secondary | ICD-10-CM

## 2022-02-20 DIAGNOSIS — F99 Mental disorder, not otherwise specified: Secondary | ICD-10-CM

## 2022-02-20 DIAGNOSIS — Z720 Tobacco use: Secondary | ICD-10-CM

## 2022-02-20 DIAGNOSIS — E119 Type 2 diabetes mellitus without complications: Secondary | ICD-10-CM

## 2022-02-20 DIAGNOSIS — Z801 Family history of malignant neoplasm of trachea, bronchus and lung: Secondary | ICD-10-CM

## 2022-02-20 DIAGNOSIS — M797 Fibromyalgia: Secondary | ICD-10-CM

## 2022-02-20 DIAGNOSIS — M2550 Pain in unspecified joint: Secondary | ICD-10-CM

## 2022-02-20 DIAGNOSIS — Z83719 Family history of colon polyps, unspecified: Secondary | ICD-10-CM

## 2022-02-20 DIAGNOSIS — Z8639 Personal history of other endocrine, nutritional and metabolic disease: Secondary | ICD-10-CM

## 2022-02-20 DIAGNOSIS — F5105 Insomnia due to other mental disorder: Secondary | ICD-10-CM

## 2022-02-21 LAB — COMPLETE METABOLIC PANEL WITHOUT GFR
AG Ratio: 1.4 (calc) (ref 1.0–2.5)
ALT: 38 U/L — ABNORMAL HIGH (ref 6–29)
AST: 30 U/L (ref 10–35)
Albumin: 4.2 g/dL (ref 3.6–5.1)
Alkaline phosphatase (APISO): 95 U/L (ref 37–153)
BUN: 13 mg/dL (ref 7–25)
CO2: 28 mmol/L (ref 20–32)
Calcium: 9.5 mg/dL (ref 8.6–10.4)
Chloride: 104 mmol/L (ref 98–110)
Creat: 0.76 mg/dL (ref 0.50–1.05)
Globulin: 3 g/dL (ref 1.9–3.7)
Glucose, Bld: 155 mg/dL — ABNORMAL HIGH (ref 65–99)
Potassium: 4.4 mmol/L (ref 3.5–5.3)
Sodium: 139 mmol/L (ref 135–146)
Total Bilirubin: 0.4 mg/dL (ref 0.2–1.2)
Total Protein: 7.2 g/dL (ref 6.1–8.1)
eGFR: 87 mL/min/1.73m2

## 2022-02-21 LAB — SEDIMENTATION RATE: Sed Rate: 6 mm/h (ref 0–30)

## 2022-02-21 LAB — CBC WITH DIFFERENTIAL/PLATELET
Absolute Monocytes: 493 cells/uL (ref 200–950)
Basophils Absolute: 60 cells/uL (ref 0–200)
Basophils Relative: 0.7 %
Eosinophils Absolute: 77 cells/uL (ref 15–500)
Eosinophils Relative: 0.9 %
HCT: 43.7 % (ref 35.0–45.0)
Hemoglobin: 14.4 g/dL (ref 11.7–15.5)
Lymphs Abs: 3222 cells/uL (ref 850–3900)
MCH: 30.3 pg (ref 27.0–33.0)
MCHC: 33 g/dL (ref 32.0–36.0)
MCV: 91.8 fL (ref 80.0–100.0)
MPV: 11 fL (ref 7.5–12.5)
Monocytes Relative: 5.8 %
Neutro Abs: 4650 cells/uL (ref 1500–7800)
Neutrophils Relative %: 54.7 %
Platelets: 257 10*3/uL (ref 140–400)
RBC: 4.76 10*6/uL (ref 3.80–5.10)
RDW: 12.6 % (ref 11.0–15.0)
Total Lymphocyte: 37.9 %
WBC: 8.5 10*3/uL (ref 3.8–10.8)

## 2022-02-21 LAB — CYCLIC CITRUL PEPTIDE ANTIBODY, IGG: Cyclic Citrullin Peptide Ab: <16 U

## 2022-02-21 LAB — CK: Total CK: 217 U/L — ABNORMAL HIGH (ref 29–143)

## 2022-02-21 LAB — RHEUMATOID FACTOR: Rheumatoid fact SerPl-aCnc: <14 IU/mL (ref <14)

## 2022-02-22 NOTE — Progress Notes (Signed)
I will discuss results at the follow-up visit.

## 2022-03-12 NOTE — Progress Notes (Deleted)
Office Visit Note  Patient: Melissa Harmon             Date of Birth: [birth year]           MRN: [id number]             PCP: Delford Field, FNP Referring: Delford Field, FNP Visit Date: 03/26/2022 Occupation: '@GUAROCC'$ @  Subjective:  No chief complaint on file.   History of Present Illness: Melissa Harmon is a 66 y.o. female ***   Activities of Daily Living:  Patient reports morning stiffness for *** {minute/hour:19697}.   Patient {ACTIONS;DENIES/REPORTS:21021675::"Denies"} nocturnal pain.  Difficulty dressing/grooming: {ACTIONS;DENIES/REPORTS:21021675::"Denies"} Difficulty climbing stairs: {ACTIONS;DENIES/REPORTS:21021675::"Denies"} Difficulty getting out of chair: {ACTIONS;DENIES/REPORTS:21021675::"Denies"} Difficulty using hands for taps, buttons, cutlery, and/or writing: {ACTIONS;DENIES/REPORTS:21021675::"Denies"}  No Rheumatology ROS completed.   PMFS History:  Patient Active Problem List   Diagnosis Date Noted   Diabetes mellitus with neuropathy ([place]) 06/07/2020   Genetic testing 05/22/2020   Family history of colonic polyps    Family history of ovarian cancer    Family history of stomach cancer    Family history of lung cancer    Family history of breast cancer    Adjustment disorder with anxious mood 09/18/2017   MDD (major depressive disorder), recurrent episode, moderate ([place]) 09/18/2017   Insomnia due to other mental disorder 09/18/2017   DYSURIA 10/25/2009   ABDOMINAL PAIN, RIGHT UPPER QUADRANT 10/25/2009   NECK PAIN, LEFT 08/23/2009   HAND PAIN, RIGHT 08/23/2009   ELBOW PAIN, BILATERAL 05/17/2009   TOBACCO USER 05/13/2009   DIABETES MELLITUS, TYPE II, WITHOUT COMPLICATIONS 38/32/9191   DYSLIPIDEMIA 12/14/2008   SHORTNESS OF BREATH 12/14/2008   RECTAL BLEEDING 04/22/2007   COLONIC POLYPS, HX OF 04/22/2007   BLOOD IN STOOL 04/15/2007   PITYRIASIS ROSEA 08/09/2006   HYPOTHYROIDISM, UNSPECIFIED 07/08/2006   GASTROESOPHAGEAL REFLUX, NO  ESOPHAGITIS 07/08/2006   FIBROMYALGIA, FIBROMYOSITIS 07/08/2006    Past Medical History:  Diagnosis Date   Bronchitis    Carpal tunnel syndrome on both sides    Family history of breast cancer    Family history of colonic polyps    Family history of lung cancer    Family history of ovarian cancer    Family history of stomach cancer    Fibromyalgia    GERD (gastroesophageal reflux disease)    History of chronic bronchitis    History of Hashimoto thyroiditis    s/p  bilateral thryoidectomy 03/ 2001   Hyperlipidemia    Hypothyroidism, postsurgical followed by pcp   08-04-1999 s/p  bilateral thyroidectomy for multinodular goitar (per path hashimoto's thyroiditis)   Irritable bowel syndrome with constipation    MDD (major depressive disorder)    Pelvic pain    Right ovarian cyst    Type 2 diabetes mellitus ([place])    followed by pcp  (08-24-2019 pt stated checks blood sugar dialy in am,  fasting-- 159-175)   Wears contact lenses    Wears dentures    upper    Family History  Problem Relation Age of Onset   Diabetes Mother    Heart disease Mother    Stroke Mother    Bipolar disorder Father    Colon polyps Father        unknown number   Schizophrenia Brother    Ovarian cancer Sister 41       or other gynecologic cancer   Diverticulitis Maternal Aunt    Stomach cancer Maternal Grandmother        dx ?  Heart attack Maternal Grandmother    Colon polyps Other 25       >80 polyps (6th degree relative - mother's cousin's grandson)   Heart disease Brother    Heart disease Half-Brother    Lung cancer Cousin        dx 54s (maternal first cousin)   Breast cancer Cousin        dx 85s/70s (maternal first cousin)   Esophageal cancer Neg Hx    Rectal cancer Neg Hx    Past Surgical History:  Procedure Laterality Date   CESAREAN SECTION  1986   COLONOSCOPY  last one 05-25-2018   LAPAROSCOPIC UNILATERAL SALPINGO OOPHERECTOMY Bilateral 09/01/2019   Procedure: LAPAROSCOPIC BILATERAL  SALPINGO OOPHORECTOMY;  Surgeon: Sherlyn Hay, DO;  Location: [facility];  Service: Gynecology;  Laterality: Bilateral;   THYROIDECTOMY Bilateral 08-04-1999  dr Bubba Camp  '@[facility]'$    subtotal   UPPER GASTROINTESTINAL ENDOSCOPY     Social History   Social History Narrative   Social Hx:   Current living situation- lives in [place] alone   Born in [place] and raised in [place] by mom and dad   Siblings 3 living siblings but total 5 (pt is number the youngest)   75- HS grad   Employed- Psychologist, 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   Physical Exam   Musculoskeletal Exam: ***  CDAI Exam: CDAI Score: -- Patient Global: --; Provider Global: -- Swollen: --; Tender: -- Joint Exam 03/26/2022   No joint exam has been documented for this visit   There is currently no information documented on the homunculus. Go to the Rheumatology activity and complete the homunculus joint exam.  Investigation: No additional findings.  Imaging: XR Foot 2 Views Left  Result Date: 02/20/2022 MTP, PIP and DIP narrowing was noted.  No intertarsal, tibiotalar or subtalar joint space narrowing was noted.  Inferior and posterior calcaneal spurs were noted. Impression: These findings are consistent with early osteoarthritis of the foot.  XR Foot 2 Views Right  Result Date: 02/20/2022 MTP, PIP and DIP narrowing was noted.  No intertarsal, tibiotalar or subtalar joint space narrowing was noted.  Inferior calcaneal spur was noted. Impression: These findings are consistent with early  osteoarthritis of the foot.  XR Hand 2 View Left  Result Date: 02/20/2022 [facility], PIP and DIP narrowing was noted.  No MCP, intercarpal or radiocarpal joint space narrowing was noted.  No erosive changes were noted. Impression: These findings are consistent with osteoarthritis of the hand.  XR Hand 2 View Right  Result Date: 02/20/2022 [facility], PIP and DIP narrowing was noted.  No MCP, intercarpal or radiocarpal joint space narrowing was noted.  No erosive changes were noted. Impression: These findings are consistent with osteoarthritis of the hand.   Recent Labs: Lab Results  Component Value Date   WBC 8.5 02/20/2022   HGB 14.4 02/20/2022   PLT 257 02/20/2022   NA 139 02/20/2022   K 4.4 02/20/2022   CL 104 02/20/2022   CO2 28 02/20/2022   GLUCOSE 155 (H) 02/20/2022   BUN 13 02/20/2022  CREATININE 0.76 02/20/2022   BILITOT 0.4 02/20/2022   ALKPHOS 69 11/14/2018   AST 30 02/20/2022   ALT 38 (H) 02/20/2022   PROT 7.2 02/20/2022   ALBUMIN 4.0 11/14/2018   CALCIUM 9.5 02/20/2022   GFRAA 94 06/07/2020   CK217, sed rate 6, RF negative, anti-CCP negative  Speciality Comments: No specialty comments available.  Procedures:  No procedures performed Allergies: Gabapentin, Metformin and related, Sulfamethoxazole, and Other   Assessment / Plan:     Visit Diagnoses: No diagnosis found.  Orders: No orders of the defined types were placed in this encounter.  No orders of the defined types were placed in this encounter.   Face-to-face time spent with patient was *** minutes. Greater than 50% of time was spent in counseling and coordination of care.  Follow-Up Instructions: No follow-ups on file.   Bo Merino, MD  Note - This record has been created using Editor, commissioning.  Chart creation errors have been sought, but may not always  have been located. Such creation errors do not reflect on  the standard of medical care.

## 2022-03-26 ENCOUNTER — Ambulatory Visit: Payer: Commercial Managed Care - HMO | Admitting: Rheumatology

## 2022-03-26 DIAGNOSIS — M19042 Primary osteoarthritis, left hand: Secondary | ICD-10-CM

## 2022-03-26 DIAGNOSIS — E119 Type 2 diabetes mellitus without complications: Secondary | ICD-10-CM

## 2022-03-26 DIAGNOSIS — E785 Hyperlipidemia, unspecified: Secondary | ICD-10-CM

## 2022-03-26 DIAGNOSIS — Z8659 Personal history of other mental and behavioral disorders: Secondary | ICD-10-CM

## 2022-03-26 DIAGNOSIS — F99 Mental disorder, not otherwise specified: Secondary | ICD-10-CM

## 2022-03-26 DIAGNOSIS — R5383 Other fatigue: Secondary | ICD-10-CM

## 2022-03-26 DIAGNOSIS — Z86010 Personal history of colonic polyps: Secondary | ICD-10-CM

## 2022-03-26 DIAGNOSIS — Z8719 Personal history of other diseases of the digestive system: Secondary | ICD-10-CM

## 2022-03-26 DIAGNOSIS — M19071 Primary osteoarthritis, right ankle and foot: Secondary | ICD-10-CM

## 2022-03-26 DIAGNOSIS — Z720 Tobacco use: Secondary | ICD-10-CM

## 2022-03-26 DIAGNOSIS — Z8639 Personal history of other endocrine, nutritional and metabolic disease: Secondary | ICD-10-CM

## 2022-03-26 DIAGNOSIS — M797 Fibromyalgia: Secondary | ICD-10-CM

## 2022-03-26 DIAGNOSIS — Z8261 Family history of arthritis: Secondary | ICD-10-CM

## 2022-04-23 ENCOUNTER — Other Ambulatory Visit: Payer: Self-pay | Admitting: Nurse Practitioner

## 2022-04-23 DIAGNOSIS — K219 Gastro-esophageal reflux disease without esophagitis: Secondary | ICD-10-CM
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
## 2022-05-05 ENCOUNTER — Encounter: Payer: Self-pay | Admitting: Gastroenterology

## 2022-05-07 ENCOUNTER — Ambulatory Visit (AMBULATORY_SURGERY_CENTER): Payer: Commercial Managed Care - HMO | Admitting: *Deleted

## 2022-05-07 VITALS — Ht 63.0 in | Wt 161.0 lb

## 2022-05-07 DIAGNOSIS — Z86010 Personal history of colonic polyps: Secondary | ICD-10-CM

## 2022-05-07 MED ORDER — PLENVU 140 G PO SOLR: 1.0000 | ORAL | 0 refills | Status: DC

## 2022-05-07 NOTE — Progress Notes (Signed)
No egg or soy allergy known to patient  No issues known to pt with past sedation with any surgeries or procedures Patient denies ever being told they had issues or difficulty with intubation  No FH of Malignant Hyperthermia Pt is not on diet pills Pt is not on  home 02  Pt is not on blood thinners  Pt denies issues with constipation  Pt is not on dialysis Pt denies any upcoming cardiac testing Pt encouraged to use to use Singlecare or Goodrx to reduce cost  Patient's chart reviewed by Osvaldo Angst CNRA prior to previsit and patient appropriate for the [place].  Previsit completed and red dot placed by patient's name on their procedure day (on provider's schedule).  . Pre-visit done over phone Instructions sent by mail

## 2022-05-11 ENCOUNTER — Other Ambulatory Visit: Payer: Self-pay | Admitting: Podiatry

## 2022-05-12 ENCOUNTER — Ambulatory Visit: Payer: Commercial Managed Care - HMO | Attending: Rheumatology | Admitting: Rheumatology

## 2022-05-12 ENCOUNTER — Other Ambulatory Visit: Payer: Self-pay | Admitting: Podiatry

## 2022-05-12 ENCOUNTER — Encounter: Payer: Self-pay | Admitting: Rheumatology

## 2022-05-12 VITALS — BP 144/84 | HR 68 | Resp 16 | Ht 63.0 in | Wt 158.0 lb

## 2022-05-12 DIAGNOSIS — M19072 Primary osteoarthritis, left ankle and foot: Secondary | ICD-10-CM

## 2022-05-12 DIAGNOSIS — Z720 Tobacco use: Secondary | ICD-10-CM

## 2022-05-12 DIAGNOSIS — E119 Type 2 diabetes mellitus without complications: Secondary | ICD-10-CM

## 2022-05-12 DIAGNOSIS — M19041 Primary osteoarthritis, right hand: Secondary | ICD-10-CM

## 2022-05-12 DIAGNOSIS — F5105 Insomnia due to other mental disorder: Secondary | ICD-10-CM

## 2022-05-12 DIAGNOSIS — Z8659 Personal history of other mental and behavioral disorders: Secondary | ICD-10-CM

## 2022-05-12 DIAGNOSIS — M797 Fibromyalgia: Secondary | ICD-10-CM | POA: Diagnosis not present

## 2022-05-12 DIAGNOSIS — Z8261 Family history of arthritis: Secondary | ICD-10-CM | POA: Diagnosis not present

## 2022-05-12 DIAGNOSIS — Z86010 Personal history of colonic polyps: Secondary | ICD-10-CM

## 2022-05-12 DIAGNOSIS — Z8639 Personal history of other endocrine, nutritional and metabolic disease: Secondary | ICD-10-CM

## 2022-05-12 DIAGNOSIS — M19042 Primary osteoarthritis, left hand: Secondary | ICD-10-CM

## 2022-05-12 DIAGNOSIS — M19071 Primary osteoarthritis, right ankle and foot: Secondary | ICD-10-CM

## 2022-05-12 DIAGNOSIS — Z8719 Personal history of other diseases of the digestive system: Secondary | ICD-10-CM

## 2022-05-12 DIAGNOSIS — F99 Mental disorder, not otherwise specified: Secondary | ICD-10-CM

## 2022-05-12 DIAGNOSIS — E785 Hyperlipidemia, unspecified: Secondary | ICD-10-CM

## 2022-05-12 NOTE — Patient Instructions (Signed)
Hand Exercises Hand exercises can be helpful for almost anyone. These exercises can strengthen the hands, improve flexibility and movement, and increase blood flow to the hands. These results can make work and daily tasks easier. Hand exercises can be especially helpful for people who have joint pain from arthritis or have nerve damage from overuse (carpal tunnel syndrome). These exercises can also help people who have injured a hand. Exercises Most of these hand exercises are gentle stretching and motion exercises. It is usually safe to do them often throughout the day. Warming up your hands before exercise may help to reduce stiffness. You can do this with gentle massage or by placing your hands in warm water for 10-15 minutes. It is normal to feel some stretching, pulling, tightness, or mild discomfort as you begin new exercises. This will gradually improve. Stop an exercise right away if you feel sudden, severe pain or your pain gets worse. Ask your health care provider which exercises are best for you. Knuckle bend or "claw" fist  Stand or sit with your arm, hand, and all five fingers pointed straight up. Make sure to keep your wrist straight during the exercise. Gently bend your fingers down toward your palm until the tips of your fingers are touching the top of your palm. Keep your big knuckle straight and just bend the small knuckles in your fingers. Hold this position for __________ seconds. Straighten (extend) your fingers back to the starting position. Repeat this exercise 5-10 times with each hand. Full finger fist  Stand or sit with your arm, hand, and all five fingers pointed straight up. Make sure to keep your wrist straight during the exercise. Gently bend your fingers into your palm until the tips of your fingers are touching the middle of your palm. Hold this position for __________ seconds. Extend your fingers back to the starting position, stretching every joint fully. Repeat  this exercise 5-10 times with each hand. Straight fist Stand or sit with your arm, hand, and all five fingers pointed straight up. Make sure to keep your wrist straight during the exercise. Gently bend your fingers at the big knuckle, where your fingers meet your hand, and the middle knuckle. Keep the knuckle at the tips of your fingers straight and try to touch the bottom of your palm. Hold this position for __________ seconds. Extend your fingers back to the starting position, stretching every joint fully. Repeat this exercise 5-10 times with each hand. Tabletop  Stand or sit with your arm, hand, and all five fingers pointed straight up. Make sure to keep your wrist straight during the exercise. Gently bend your fingers at the big knuckle, where your fingers meet your hand, as far down as you can while keeping the small knuckles in your fingers straight. Think of forming a tabletop with your fingers. Hold this position for __________ seconds. Extend your fingers back to the starting position, stretching every joint fully. Repeat this exercise 5-10 times with each hand. Finger spread  Place your hand flat on a table with your palm facing down. Make sure your wrist stays straight as you do this exercise. Spread your fingers and thumb apart from each other as far as you can until you feel a gentle stretch. Hold this position for __________ seconds. Bring your fingers and thumb tight together again. Hold this position for __________ seconds. Repeat this exercise 5-10 times with each hand. Making circles  Stand or sit with your arm, hand, and all five fingers pointed   straight up. Make sure to keep your wrist straight during the exercise. Make a circle by touching the tip of your thumb to the tip of your index finger. Hold for __________ seconds. Then open your hand wide. Repeat this motion with your thumb and each finger on your hand. Repeat this exercise 5-10 times with each hand. Thumb  motion  Sit with your forearm resting on a table and your wrist straight. Your thumb should be facing up toward the ceiling. Keep your fingers relaxed as you move your thumb. Lift your thumb up as high as you can toward the ceiling. Hold for __________ seconds. Bend your thumb across your palm as far as you can, reaching the tip of your thumb for the small finger (pinkie) side of your palm. Hold for __________ seconds. Repeat this exercise 5-10 times with each hand. Grip strengthening  Hold a stress ball or other soft ball in the middle of your hand. Slowly increase the pressure, squeezing the ball as much as you can without causing pain. Think of bringing the tips of your fingers into the middle of your palm. All of your finger joints should bend when doing this exercise. Hold your squeeze for __________ seconds, then relax. Repeat this exercise 5-10 times with each hand. Contact a health care provider if: Your hand pain or discomfort gets much worse when you do an exercise. Your hand pain or discomfort does not improve within 2 hours after you exercise. If you have any of these problems, stop doing these exercises right away. Do not do them again unless your health care provider says that you can. Get help right away if: You develop sudden, severe hand pain or swelling. If this happens, stop doing these exercises right away. Do not do them again unless your health care provider says that you can. This information is not intended to replace advice given to you by your health care provider. Make sure you discuss any questions you have with your health care provider. Document Revised: 08/15/2020 Document Reviewed: 08/15/2020 Elsevier Patient Education  2023 [employer]  

## 2022-05-13 ENCOUNTER — Other Ambulatory Visit: Payer: Self-pay | Admitting: Nurse Practitioner

## 2022-05-13 ENCOUNTER — Other Ambulatory Visit: Payer: Self-pay | Admitting: Podiatry

## 2022-05-13 DIAGNOSIS — E1140 Type 2 diabetes mellitus with diabetic neuropathy, unspecified: Secondary | ICD-10-CM

## 2022-05-13 MED ORDER — AMMONIUM LACTATE 12 % EX LOTN: 1.0000 | TOPICAL_LOTION | CUTANEOUS | 0 refills | Status: DC | PRN

## 2022-05-15 ENCOUNTER — Encounter: Payer: Self-pay | Admitting: Podiatry

## 2022-05-15 ENCOUNTER — Ambulatory Visit (INDEPENDENT_AMBULATORY_CARE_PROVIDER_SITE_OTHER): Payer: Commercial Managed Care - HMO | Admitting: Podiatry

## 2022-05-15 DIAGNOSIS — M79674 Pain in right toe(s): Secondary | ICD-10-CM

## 2022-05-15 DIAGNOSIS — M79675 Pain in left toe(s): Secondary | ICD-10-CM

## 2022-05-15 DIAGNOSIS — B351 Tinea unguium: Secondary | ICD-10-CM

## 2022-05-15 DIAGNOSIS — Z794 Long term (current) use of insulin: Secondary | ICD-10-CM | POA: Diagnosis not present

## 2022-05-15 DIAGNOSIS — M792 Neuralgia and neuritis, unspecified: Secondary | ICD-10-CM

## 2022-05-15 DIAGNOSIS — E1140 Type 2 diabetes mellitus with diabetic neuropathy, unspecified: Secondary | ICD-10-CM | POA: Diagnosis not present

## 2022-05-15 MED ORDER — AMMONIUM LACTATE 12 % EX LOTN: 1.0000 | TOPICAL_LOTION | CUTANEOUS | 0 refills | Status: DC | PRN

## 2022-05-15 NOTE — Progress Notes (Signed)
This patient returns to my office for at risk foot care.  This patient requires this care by a professional since this patient will be at risk due to having diabetes.  This patient is unable to cut nails herself since the patient cannot reach her nails.These nails are painful walking and wearing shoes.  This patient presents for at risk foot care today.  General Appearance  Alert, conversant and in no acute stress.  Vascular  Dorsalis pedis and posterior tibial  pulses are palpable  bilaterally.  Capillary return is within normal limits  bilaterally. Temperature is within normal limits  bilaterally.  Neurologic  Senn-Weinstein monofilament wire test within normal limits  bilaterally. Muscle power within normal limits bilaterally.  Nails Thick disfigured discolored nails with subungual debris  from hallux to fifth toes bilaterally. No evidence of bacterial infection or drainage bilaterally.  Orthopedic  No limitations of motion  feet .  No crepitus or effusions noted.  No bony pathology or digital deformities noted.  Skin  normotropic skin with no porokeratosis noted bilaterally.  No signs of infections or ulcers noted.     Onychomycosis  Pain in right toes  Pain in left toes  Consent was obtained for treatment procedures.   Mechanical debridement of nails 1-5  bilaterally performed with a nail nipper.  Filed with dremel without incident. Prescribe amlactin.   Return office visit   3 months                   Told patient to return for periodic foot care and evaluation due to potential at risk complications.   Gardiner Barefoot DPM

## 2022-05-22 ENCOUNTER — Other Ambulatory Visit: Payer: Self-pay | Admitting: Nurse Practitioner

## 2022-05-22 DIAGNOSIS — E1140 Type 2 diabetes mellitus with diabetic neuropathy, unspecified: Secondary | ICD-10-CM

## 2022-06-05 ENCOUNTER — Encounter: Payer: Self-pay | Admitting: Gastroenterology

## 2022-06-09 ENCOUNTER — Ambulatory Visit: Payer: Commercial Managed Care - HMO | Attending: Nurse Practitioner | Admitting: Nurse Practitioner

## 2022-06-09 ENCOUNTER — Encounter: Payer: Self-pay | Admitting: Nurse Practitioner

## 2022-06-09 VITALS — BP 133/75 | HR 87 | Ht 63.0 in | Wt 155.0 lb

## 2022-06-09 DIAGNOSIS — E039 Hypothyroidism, unspecified: Secondary | ICD-10-CM

## 2022-06-09 DIAGNOSIS — K219 Gastro-esophageal reflux disease without esophagitis: Secondary | ICD-10-CM | POA: Diagnosis not present

## 2022-06-09 DIAGNOSIS — E1140 Type 2 diabetes mellitus with diabetic neuropathy, unspecified: Secondary | ICD-10-CM

## 2022-06-09 MED ORDER — GLIPIZIDE ER 5 MG PO TB24: 5.0000 mg | ORAL_TABLET | Freq: Every day | ORAL | 1 refills | Status: DC

## 2022-06-09 MED ORDER — PANTOPRAZOLE SODIUM 40 MG PO TBEC: 40.0000 mg | DELAYED_RELEASE_TABLET | Freq: Every day | ORAL | 1 refills | Status: DC

## 2022-06-09 NOTE — Progress Notes (Signed)
Needs new meter

## 2022-06-09 NOTE — Progress Notes (Signed)
Assessment & Plan:  [name] was seen today for diabetes.  Diagnoses and all orders for this visit:  Type 2 diabetes mellitus with diabetic neuropathy, without long-term current use of insulin ([place]) -     Ambulatory referral to Ophthalmology -     glipiZIDE (GLUCOTROL XL) 5 MG 24 hr tablet; Take 1 tablet (5 mg total) by mouth daily with breakfast. -     CMP14+EGFR Continue blood sugar control as discussed in office today, low carbohydrate diet, and regular physical exercise as tolerated, 150 minutes per week (30 min each day, 5 days per week, or 50 min 3 days per week). Keep blood sugar logs with fasting goal of 90-130 mg/dl, post prandial (after you eat) less than 180.  For Hypoglycemia: BS <60 and Hyperglycemia BS >400; contact the clinic ASAP. Annual eye exams and foot exams are recommended.   Hypothyroidism, unspecified type -     Thyroid Panel With TSH  GERD without esophagitis -     pantoprazole (PROTONIX) 40 MG tablet; Take 1 tablet (40 mg total) by mouth daily. INSTRUCTIONS: Avoid GERD Triggers: acidic, spicy or fried foods, caffeine, coffee, sodas,  alcohol and chocolate.     Patient has been counseled on age-appropriate routine health concerns for screening and prevention. These are reviewed and up-to-date. Referrals have been placed accordingly. Immunizations are up-to-date or declined.    Subjective:   Chief Complaint  Patient presents with   Diabetes   HPI Melissa Harmon 67 y.o. female presents to office today for follow up to DM. I have not seen her in this office in 2 years.   Patient has been counseled on age-appropriate routine health concerns for screening and prevention. These are reviewed and up-to-date. Referrals have been placed accordingly. Immunizations are up-to-date or declined.    MAMMOGRAM: Overdue. Referral placed PAP: UTD 04-2020   She has a history of IBS, Hypothyroidism, DM, OA, Fibromyalgia  DM 2 Not at goal. She could not take metformin due to  side effects. Will refill glipizide XL 5 mg today.  Lab Results  Component Value Date   HGBA1C 8.4 (A) 06/07/2020  LDL not at goal with atorvastatin 40 mg daily.  Lab Results  Component Value Date   LDLCALC 185 (H) 05/20/2009      Hypothyroidism  Thyroid level is normal. She endorses symptoms of nausea and dizziness but also states she has not been drinking enough water. Will refill synthroid today.  Lab Results  Component Value Date   TSH 1.460 06/09/2022     Review of Systems  Constitutional:  Negative for fever, malaise/fatigue and weight loss.  HENT: Negative.  Negative for nosebleeds.   Eyes: Negative.  Negative for blurred vision, double vision and photophobia.  Respiratory: Negative.  Negative for cough and shortness of breath.   Cardiovascular: Negative.  Negative for chest pain, palpitations and leg swelling.  Gastrointestinal: Negative.  Negative for heartburn, nausea and vomiting.  Musculoskeletal: Negative.  Negative for myalgias.  Neurological: Negative.  Negative for dizziness, focal weakness, seizures and headaches.  Psychiatric/Behavioral: Negative.  Negative for suicidal ideas.     Past Medical History:  Diagnosis Date   Bronchitis    Carpal tunnel syndrome on both sides    Family history of breast cancer    Family history of colonic polyps    Family history of lung cancer    Family history of ovarian cancer    Family history of stomach cancer    Fibromyalgia  GERD (gastroesophageal reflux disease)    History of chronic bronchitis    History of Hashimoto thyroiditis    s/p  bilateral thryoidectomy 03/ 2001   Hyperlipidemia    Hypothyroidism, postsurgical followed by pcp   08-04-1999 s/p  bilateral thyroidectomy for multinodular goitar (per path hashimoto's thyroiditis)   Irritable bowel syndrome with constipation    MDD (major depressive disorder)    Pelvic pain    Right ovarian cyst    Type 2 diabetes mellitus ([place])    followed by pcp   (08-24-2019 pt stated checks blood sugar dialy in am,  fasting-- 159-175)   Wears contact lenses    Wears dentures    upper    Past Surgical History:  Procedure Laterality Date   [place]   COLONOSCOPY  last one 05-25-2018   LAPAROSCOPIC UNILATERAL SALPINGO OOPHERECTOMY Bilateral 09/01/2019   Procedure: LAPAROSCOPIC BILATERAL SALPINGO OOPHORECTOMY;  Surgeon: Sherlyn Hay, DO;  Location: [facility];  Service: Gynecology;  Laterality: Bilateral;   THYROIDECTOMY Bilateral 08-04-1999  dr Bubba Camp  '@[facility]'$    subtotal   UPPER GASTROINTESTINAL ENDOSCOPY      Family History  Problem Relation Age of Onset   Diabetes Mother    Heart disease Mother    Stroke Mother    Colon cancer Father    Bipolar disorder Father    Colon polyps Father        unknown number   Ovarian cancer Sister 38       or other gynecologic cancer   Schizophrenia Brother    Heart disease Brother    Diverticulitis Maternal Aunt    Stomach cancer Maternal Grandmother        dx ?   Heart attack Maternal Grandmother    Lung cancer Cousin        dx 48s (maternal first cousin)   Breast cancer Cousin        dx 60s/70s (maternal first cousin)   Heart disease Half-Brother    Colon polyps Other 25       >80 polyps (6th degree relative - mother's cousin's grandson)   Esophageal cancer Neg Hx    Rectal cancer Neg Hx     Social History Reviewed with no changes to be made today.   Outpatient Medications Prior to Visit  Medication Sig Dispense Refill   albuterol (VENTOLIN HFA) 108 (90 Base) MCG/ACT inhaler Inhale 2 puffs into the lungs every 6 (six) hours as needed for wheezing. 18 g 1   ammonium lactate (AMLACTIN) 12 % lotion Apply 1 Application topically as needed for dry skin. 400 g 0   atorvastatin (LIPITOR) 40 MG tablet Take 40 mg by mouth daily.     Blood Glucose Monitoring Suppl (ONETOUCH VERIO) w/Device KIT Use as instructed. Check blood glucose level by fingerstick twice per day.  E11.65 1 kit 0   docusate sodium (COLACE) 100 MG capsule Take 100 mg by mouth 2 (two) times daily as needed for mild constipation.     glucose blood test strip Use as instructed. Inject into the skin twice daily. E11.65 100 each 12   ibuprofen (ADVIL) 600 MG tablet Take 1 tablet (600 mg total) by mouth every 6 (six) hours as needed for moderate pain or cramping. 40 tablet 1   Lancet Devices (ONE TOUCH DELICA LANCING DEV) MISC Use as instructed. Inject into the skin twice daily E11.65 1 each prn   levothyroxine (SYNTHROID) 175 MCG tablet Take 175 mcg by mouth daily.  Lidocaine 5 % CREA Apply topically.     PEG-KCl-NaCl-NaSulf-Na Asc-C (PLENVU) 140 g SOLR Take 1 kit by mouth as directed. 1 each 0   glipiZIDE (GLUCOTROL XL) 5 MG 24 hr tablet Take 1 tablet by mouth once daily with breakfast 30 tablet 0   pantoprazole (PROTONIX) 40 MG tablet Take 40 mg by mouth daily.     Facility-Administered Medications Prior to Visit  Medication Dose Route Frequency Provider Last Rate Last Admin   0.9 %  sodium chloride infusion  500 mL Intravenous Once Nandigam, Venia Minks, MD        Allergies  Allergen Reactions   Gabapentin Other (See Comments)   Metformin And Related Other (See Comments)    GI symptoms; Constipation   Sulfamethoxazole Hives   Other Rash       Objective:    BP 133/75   Pulse 87   Ht '5\' 3"'$  (1.6 m)   Wt 155 lb (70.3 kg)   SpO2 97%   BMI 27.46 kg/m  Wt Readings from Last 3 Encounters:  06/09/22 155 lb (70.3 kg)  05/12/22 158 lb (71.7 kg)  05/07/22 161 lb (73 kg)    Physical Exam Vitals and nursing note reviewed.  Constitutional:      Appearance: She is well-developed.  HENT:     Head: Normocephalic and atraumatic.  Cardiovascular:     Rate and Rhythm: Normal rate and regular rhythm.     Heart sounds: Normal heart sounds. No murmur heard.    No friction rub. No gallop.  Pulmonary:     Effort: Pulmonary effort is normal. No tachypnea or respiratory distress.      Breath sounds: Normal breath sounds. No decreased breath sounds, wheezing, rhonchi or rales.  Chest:     Chest wall: No tenderness.  Abdominal:     General: Bowel sounds are normal.     Palpations: Abdomen is soft.  Musculoskeletal:        General: Normal range of motion.     Cervical back: Normal range of motion.  Skin:    General: Skin is warm and dry.  Neurological:     Mental Status: She is alert and oriented to person, place, and time.     Coordination: Coordination normal.  Psychiatric:        Behavior: Behavior normal. Behavior is cooperative.        Thought Content: Thought content normal.        Judgment: Judgment normal.          Patient has been counseled extensively about nutrition and exercise as well as the importance of adherence with medications and regular follow-up. The patient was given clear instructions to go to ER or return to medical center if symptoms don't improve, worsen or new problems develop. The patient verbalized understanding.   Follow-up: Return in about 4 weeks (around 07/07/2022) for [place], FNP-BC [facility] and [facility] [place], [place]   06/12/2022, 6:55 PM

## 2022-06-10 LAB — THYROID PANEL WITH TSH
Free Thyroxine Index: 2.8 (ref 1.2–4.9)
T3 Uptake Ratio: 31 % (ref 24–39)
T4, Total: 9.1 ug/dL (ref 4.5–12.0)
TSH: 1.46 u[IU]/mL (ref 0.450–4.500)

## 2022-06-10 LAB — CMP14+EGFR
ALT: 50 IU/L — ABNORMAL HIGH (ref 0–32)
AST: 35 IU/L (ref 0–40)
Albumin/Globulin Ratio: 1.6 (ref 1.2–2.2)
Albumin: 4.2 g/dL (ref 3.9–4.9)
Alkaline Phosphatase: 125 IU/L — ABNORMAL HIGH (ref 44–121)
BUN/Creatinine Ratio: 20 (ref 12–28)
BUN: 15 mg/dL (ref 8–27)
Bilirubin Total: 0.5 mg/dL (ref 0.0–1.2)
CO2: 22 mmol/L (ref 20–29)
Calcium: 9.1 mg/dL (ref 8.7–10.3)
Chloride: 103 mmol/L (ref 96–106)
Creatinine, Ser: 0.74 mg/dL (ref 0.57–1.00)
Globulin, Total: 2.7 g/dL (ref 1.5–4.5)
Glucose: 162 mg/dL — ABNORMAL HIGH (ref 70–99)
Potassium: 3.9 mmol/L (ref 3.5–5.2)
Sodium: 139 mmol/L (ref 134–144)
Total Protein: 6.9 g/dL (ref 6.0–8.5)
eGFR: 90 mL/min/{1.73_m2} (ref >59)

## 2022-06-12 ENCOUNTER — Encounter: Payer: Self-pay | Admitting: Nurse Practitioner

## 2022-06-15 ENCOUNTER — Encounter: Payer: Self-pay | Admitting: Gastroenterology

## 2022-06-15 ENCOUNTER — Ambulatory Visit (AMBULATORY_SURGERY_CENTER): Payer: Commercial Managed Care - HMO | Admitting: Gastroenterology

## 2022-06-15 VITALS — BP 138/80 | HR 76 | Temp 96.2°F | Resp 23 | Ht 63.0 in | Wt 161.0 lb

## 2022-06-15 DIAGNOSIS — D125 Benign neoplasm of sigmoid colon: Secondary | ICD-10-CM

## 2022-06-15 DIAGNOSIS — D128 Benign neoplasm of rectum: Secondary | ICD-10-CM

## 2022-06-15 DIAGNOSIS — D120 Benign neoplasm of cecum: Secondary | ICD-10-CM

## 2022-06-15 DIAGNOSIS — Z09 Encounter for follow-up examination after completed treatment for conditions other than malignant neoplasm: Secondary | ICD-10-CM

## 2022-06-15 DIAGNOSIS — Z86010 Personal history of colonic polyps: Secondary | ICD-10-CM | POA: Diagnosis not present

## 2022-06-15 DIAGNOSIS — D122 Benign neoplasm of ascending colon: Secondary | ICD-10-CM

## 2022-06-15 MED ORDER — SODIUM CHLORIDE 0.9 % IV SOLN: 500.0000 mL | Freq: Once | INTRAVENOUS | Status: DC

## 2022-06-15 NOTE — Progress Notes (Unsigned)
[facility] History and Physical   Primary Care Physician:  Gildardo Pounds, NP   Reason for Procedure:  History of adenomatous colon polyps  Plan:    Surveillance colonoscopy with possible interventions as needed     HPI: Melissa Harmon is a very pleasant 66 y.o. female here for surveillance colonoscopy. Denies any nausea, vomiting, abdominal pain, melena or bright red blood per rectum  The risks and benefits as well as alternatives of endoscopic procedure(s) have been discussed and reviewed. All questions answered. The patient agrees to proceed.    Past Medical History:  Diagnosis Date   Bronchitis    Carpal tunnel syndrome on both sides    Family history of breast cancer    Family history of colonic polyps    Family history of lung cancer    Family history of ovarian cancer    Family history of stomach cancer    Fibromyalgia    GERD (gastroesophageal reflux disease)    History of chronic bronchitis    History of Hashimoto thyroiditis    s/p  bilateral thryoidectomy 03/ 2001   Hyperlipidemia    Hypothyroidism, postsurgical followed by pcp   08-04-1999 s/p  bilateral thyroidectomy for multinodular goitar (per path hashimoto's thyroiditis)   Irritable bowel syndrome with constipation    MDD (major depressive disorder)    Pelvic pain    Right ovarian cyst    Type 2 diabetes mellitus ([place])    followed by pcp  (08-24-2019 pt stated checks blood sugar dialy in am,  fasting-- 159-175)   Wears contact lenses    Wears dentures    upper    Past Surgical History:  Procedure Laterality Date   [place]  last one 05-25-2018   LAPAROSCOPIC UNILATERAL SALPINGO OOPHERECTOMY Bilateral 09/01/2019   Procedure: LAPAROSCOPIC BILATERAL SALPINGO OOPHORECTOMY;  Surgeon: Sherlyn Hay, DO;  Location: [facility];  Service: Gynecology;  Laterality: Bilateral;   THYROIDECTOMY Bilateral 08-04-1999  dr Bubba Camp  '@[facility]    subtotal    UPPER GASTROINTESTINAL ENDOSCOPY      Prior to Admission medications   Medication Sig Start Date End Date Taking? Authorizing Provider  ammonium lactate (AMLACTIN) 12 % lotion Apply 1 Application topically as needed for dry skin. 05/15/22  Yes Gardiner Barefoot, DPM  atorvastatin (LIPITOR) 40 MG tablet Take 40 mg by mouth daily.   Yes [provider]  Blood Glucose Monitoring Suppl (ONETOUCH VERIO) w/Device KIT Use as instructed. Check blood glucose level by fingerstick twice per day. E11.65 06/07/20  Yes Gildardo Pounds, NP  glipiZIDE (GLUCOTROL XL) 5 MG 24 hr tablet Take 1 tablet (5 mg total) by mouth daily with breakfast. 06/09/22  Yes Gildardo Pounds, NP  levothyroxine (SYNTHROID) 175 MCG tablet Take 175 mcg by mouth daily. 01/13/22  Yes [provider]  naproxen (NAPROSYN) 250 MG tablet Take 250 mg by mouth 2 (two) times daily with a meal.   Yes [provider]  pantoprazole (PROTONIX) 40 MG tablet Take 1 tablet (40 mg total) by mouth daily. 06/09/22  Yes Gildardo Pounds, NP  albuterol (VENTOLIN HFA) 108 (90 Base) MCG/ACT inhaler Inhale 2 puffs into the lungs every 6 (six) hours as needed for wheezing. 06/07/20   Gildardo Pounds, NP  docusate sodium (COLACE) 100 MG capsule Take 100 mg by mouth 2 (two) times daily as needed for mild constipation.    [provider]  glucose blood test strip Use as  instructed. Inject into the skin twice daily. E11.65 06/07/20   Gildardo Pounds, NP  ibuprofen (ADVIL) 600 MG tablet Take 1 tablet (600 mg total) by mouth every 6 (six) hours as needed for moderate pain or cramping. 09/01/19   Banga, Bonnee Quin, DO  Lancet Devices (ONE TOUCH DELICA LANCING DEV) MISC Use as instructed. Inject into the skin twice daily E11.65 06/07/20   Gildardo Pounds, NP  Lidocaine 5 % CREA Apply topically. 01/30/22   [provider]  omeprazole (PRILOSEC) 20 MG capsule Take 1 capsule (20 mg total) by mouth daily. 12/26/14 06/09/22  Serita Grit,  MD    Current Outpatient Medications  Medication Sig Dispense Refill   ammonium lactate (AMLACTIN) 12 % lotion Apply 1 Application topically as needed for dry skin. 400 g 0   atorvastatin (LIPITOR) 40 MG tablet Take 40 mg by mouth daily.     Blood Glucose Monitoring Suppl (ONETOUCH VERIO) w/Device KIT Use as instructed. Check blood glucose level by fingerstick twice per day. E11.65 1 kit 0   glipiZIDE (GLUCOTROL XL) 5 MG 24 hr tablet Take 1 tablet (5 mg total) by mouth daily with breakfast. 90 tablet 1   levothyroxine (SYNTHROID) 175 MCG tablet Take 175 mcg by mouth daily.     naproxen (NAPROSYN) 250 MG tablet Take 250 mg by mouth 2 (two) times daily with a meal.     pantoprazole (PROTONIX) 40 MG tablet Take 1 tablet (40 mg total) by mouth daily. 90 tablet 1   albuterol (VENTOLIN HFA) 108 (90 Base) MCG/ACT inhaler Inhale 2 puffs into the lungs every 6 (six) hours as needed for wheezing. 18 g 1   docusate sodium (COLACE) 100 MG capsule Take 100 mg by mouth 2 (two) times daily as needed for mild constipation.     glucose blood test strip Use as instructed. Inject into the skin twice daily. E11.65 100 each 12   ibuprofen (ADVIL) 600 MG tablet Take 1 tablet (600 mg total) by mouth every 6 (six) hours as needed for moderate pain or cramping. 40 tablet 1   Lancet Devices (ONE TOUCH DELICA LANCING DEV) MISC Use as instructed. Inject into the skin twice daily E11.65 1 each prn   Lidocaine 5 % CREA Apply topically.     Current Facility-Administered Medications  Medication Dose Route Frequency Provider Last Rate Last Admin   0.9 %  sodium chloride infusion  500 mL Intravenous Once Nianna Igo V, MD       0.9 %  sodium chloride infusion  500 mL Intravenous Once Mauri Pole, MD        Allergies as of 06/15/2022 - Review Complete 06/15/2022  Allergen Reaction Noted   Gabapentin Other (See Comments) 03/11/2020   Metformin and related Other (See Comments) 07/16/2020   Sulfamethoxazole  Hives 12/10/2005   Other Rash 03/11/2020    Family History  Problem Relation Age of Onset   Diabetes Mother    Heart disease Mother    Stroke Mother    Colon cancer Father    Bipolar disorder Father    Colon polyps Father        unknown number   Ovarian cancer Sister 68       or other gynecologic cancer   Schizophrenia Brother    Heart disease Brother    Diverticulitis Maternal Aunt    Stomach cancer Maternal Grandmother        dx ?   Heart attack Maternal Grandmother  Lung cancer Cousin        dx 64s (maternal first cousin)   Breast cancer Cousin        dx 60s/70s (maternal first cousin)   Heart disease Half-Brother    Colon polyps Other 25       >80 polyps (6th degree relative - mother's cousin's grandson)   Esophageal cancer Neg Hx    Rectal cancer Neg Hx     Social History   Socioeconomic History   Marital status: Single    Spouse name: Not on file   Number of children: Not on file   Years of education: Not on file   Highest education level: Not on file  Occupational History   Not on file  Tobacco Use   Smoking status: Every Day    Packs/day: 0.25    Years: 25.00    Total pack years: 6.25    Types: Cigarettes    Passive exposure: Current   Smokeless tobacco: Never   Tobacco comments:    6 cig per day  Vaping Use   Vaping Use: Never used  Substance and Sexual Activity   Alcohol use: Yes    Alcohol/week: 6.0 standard drinks of alcohol    Types: 6 Cans of beer per week    Comment: occ   Drug use: Never   Sexual activity: Not Currently    Birth control/protection: Post-menopausal  Other Topics Concern   Not on file  Social History Narrative   Social Hx:   Current living situation- lives in [place] alone   Born in [place] and raised in [place] by mom and dad   Siblings 3 living siblings but total 5 (pt is number the youngest)   57- HS grad   Employed- Psychologist, 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   Married- divorced 1 yr ago but was  separated for several years   Kids- 3   Legal issues- denies   Social Determinants of Radio broadcast assistant Strain: Not on file  Food Insecurity: No Food Insecurity (05/13/2021)   Hunger Vital Sign    Worried About Running Out of Food in the Last Year: Never true    Ran Out of Food in the Last Year: Never true  Transportation Needs: No Transportation Needs (05/13/2021)   PRAPARE - Hydrologist (Medical): No    Lack of Transportation (Non-Medical): No  Physical Activity: Not on file  Stress: Not on file  Social Connections: Not on file  Intimate Partner Violence: Not on file    Review of Systems:  All other review of systems negative except as mentioned in the HPI.  Physical Exam: Vital signs in last 24 hours: Blood Pressure 139/72   Pulse 80   Temperature (Abnormal) 96.2 F (35.7 C)   Height '5\' 3"'$  (1.6 m)   Weight 161 lb (73 kg)   Oxygen Saturation 97%   Body Mass Index 28.52 kg/m  General:   Alert, NAD Lungs:  Clear .   Heart:  Regular rate and rhythm Abdomen:  Soft, nontender and nondistended. Neuro/Psych:  Alert and cooperative. Normal mood and affect. A and O x 3  Reviewed labs, radiology imaging, old records and pertinent past GI work up  Patient is appropriate for planned procedure(s) and anesthesia in an ambulatory setting   K. Denzil Magnuson , MD [phone]

## 2022-06-15 NOTE — Op Note (Signed)
Page Patient Name: Melissa Harmon Procedure Date: 06/15/2022 1:53 PM MRN: [id number] Endoscopist: Mauri Pole , MD, [id number] Age: 67 Referring MD:  Date of Birth: [birth year] Gender: Female Account #: [id number] Procedure:                Colonoscopy Indications:              High risk colon cancer surveillance: Personal                            history of colonic polyps, High risk colon cancer                            surveillance: Personal history of multiple (3 or                            more) adenomas Medicines:                Monitored Anesthesia Care Procedure:                Pre-Anesthesia Assessment:                           - Prior to the procedure, a History and Physical                            was performed, and patient medications and                            allergies were reviewed. The patient's tolerance of                            previous anesthesia was also reviewed. The risks                            and benefits of the procedure and the sedation                            options and risks were discussed with the patient.                            All questions were answered, and informed consent                            was obtained. Prior Anticoagulants: The patient has                            taken no anticoagulant or antiplatelet agents. ASA                            Grade Assessment: II - A patient with mild systemic                            disease. After reviewing the risks and benefits,  the patient was deemed in satisfactory condition to                            undergo the procedure.                           After obtaining informed consent, the colonoscope                            was passed under direct vision. Throughout the                            procedure, the patient's blood pressure, pulse, and                            oxygen saturations were monitored continuously.  The                            [id number]-HQ190L Colonoscope [id number] was introduced                            through the anus and advanced to the the cecum,                            identified by appendiceal orifice and ileocecal                            valve. The colonoscopy was performed without                            difficulty. The patient tolerated the procedure                            well. The quality of the bowel preparation was                            excellent. The ileocecal valve, appendiceal                            orifice, and rectum were photographed. Scope In: 2:07:50 PM Scope Out: 2:30:24 PM Scope Withdrawal Time: 0 hours 13 minutes 52 seconds  Total Procedure Duration: 0 hours 22 minutes 34 seconds  Findings:                 The perianal and digital rectal examinations were                            normal.                           A 1 mm polyp was found in the cecum. The polyp was                            sessile. The polyp was removed with a cold biopsy  forceps. Resection and retrieval were complete.                           Three sessile polyps were found in the rectum,                            sigmoid colon and ascending colon. The polyps were                            4 to 6 mm in size. These polyps were removed with a                            cold snare. Resection and retrieval were complete.                           Scattered small-mouthed diverticula were found in                            the sigmoid colon and descending colon.                           Non-bleeding internal hemorrhoids were found during                            retroflexion. The hemorrhoids were medium-sized. Complications:            No immediate complications. Estimated Blood Loss:     Estimated blood loss was minimal. Impression:               - One 1 mm polyp in the cecum, removed with a cold                            biopsy forceps.  Resected and retrieved.                           - Three 4 to 6 mm polyps in the rectum, in the                            sigmoid colon and in the ascending colon, removed                            with a cold snare. Resected and retrieved.                           - Diverticulosis in the sigmoid colon and in the                            descending colon.                           - Non-bleeding internal hemorrhoids. Recommendation:           - Patient has a contact number available for  emergencies. The signs and symptoms of potential                            delayed complications were discussed with the                            patient. Return to normal activities tomorrow.                            Written discharge instructions were provided to the                            patient.                           - Resume previous diet.                           - Continue present medications.                           - Await pathology results.                           - Repeat colonoscopy in 3 - 5 years for                            surveillance based on pathology results. Mauri Pole, MD 06/15/2022 2:36:31 PM This report has been signed electronically.

## 2022-06-15 NOTE — Progress Notes (Unsigned)
Sedate, gd SR, tolerated procedure well, VSS, report to RN 

## 2022-06-15 NOTE — Progress Notes (Unsigned)
Called to room to assist during endoscopic procedure.  Patient ID and intended procedure confirmed with present staff. Received instructions for my participation in the procedure from the performing physician.  

## 2022-06-15 NOTE — Progress Notes (Unsigned)
Pt's states no medical or surgical changes since previsit or office visit. 

## 2022-06-15 NOTE — Patient Instructions (Signed)
Await pathology results.  Handouts on polyps, diverticulosis, and hemorrhoids provided.  YOU HAD AN ENDOSCOPIC PROCEDURE TODAY AT [facility]:   Refer to the procedure report that was given to you for any specific questions about what was found during the examination.  If the procedure report does not answer your questions, please call your gastroenterologist to clarify.  If you requested that your care partner not be given the details of your procedure findings, then the procedure report has been included in a sealed envelope for you to review at your convenience later.  YOU SHOULD EXPECT: Some feelings of bloating in the abdomen. Passage of more gas than usual.  Walking can help get rid of the air that was put into your GI tract during the procedure and reduce the bloating. If you had a lower endoscopy (such as a colonoscopy or flexible sigmoidoscopy) you may notice spotting of blood in your stool or on the toilet paper. If you underwent a bowel prep for your procedure, you may not have a normal bowel movement for a few days.  Please Note:  You might notice some irritation and congestion in your nose or some drainage.  This is from the oxygen used during your procedure.  There is no need for concern and it should clear up in a day or so.  SYMPTOMS TO REPORT IMMEDIATELY:  Following lower endoscopy (colonoscopy or flexible sigmoidoscopy):  Excessive amounts of blood in the stool  Significant tenderness or worsening of abdominal pains  Swelling of the abdomen that is new, acute  Fever of 100F or higher   For urgent or emergent issues, a gastroenterologist can be reached at any hour by calling [phone]. Do not use MyChart messaging for urgent concerns.    DIET:  We do recommend a small meal at first, but then you may proceed to your regular diet.  Drink plenty of fluids but you should avoid alcoholic beverages for 24 hours.  ACTIVITY:  You should plan to take it easy  for the rest of today and you should NOT DRIVE or use heavy machinery until tomorrow (because of the sedation medicines used during the test).    FOLLOW UP: Our staff will call the number listed on your records the next business day following your procedure.  We will call around 7:15- 8:00 am to check on you and address any questions or concerns that you may have regarding the information given to you following your procedure. If we do not reach you, we will leave a message.     If any biopsies were taken you will be contacted by phone or by letter within the next 1-3 weeks.  Please call us at [phone] if you have not heard about the biopsies in 3 weeks.    SIGNATURES/CONFIDENTIALITY: You and/or your care partner have signed paperwork which will be entered into your electronic medical record.  These signatures attest to the fact that that the information above on your After Visit Summary has been reviewed and is understood.  Full responsibility of the confidentiality of this discharge information lies with you and/or your care-partner.

## 2022-06-16 ENCOUNTER — Telehealth: Payer: Self-pay

## 2022-06-16 ENCOUNTER — Encounter: Payer: Self-pay | Admitting: Gastroenterology

## 2022-06-16 NOTE — Telephone Encounter (Signed)
  Follow up Call-     06/15/2022   12:58 PM 03/26/2020    7:38 AM  Call back number  Post procedure Call Back phone  # [phone] cell  Permission to leave phone message Yes Yes     Patient questions:  Do you have a fever, pain , or abdominal swelling? No. Pain Score  0 *  Have you tolerated food without any problems? Yes.    Have you been able to return to your normal activities? Yes.    Do you have any questions about your discharge instructions: Diet   No. Medications  No. Follow up visit  No.  Do you have questions or concerns about your Care? No.  Actions: * If pain score is 4 or above: No action needed, pain <4.

## 2022-06-25 ENCOUNTER — Encounter: Payer: Self-pay | Admitting: Gastroenterology

## 2022-07-13 ENCOUNTER — Ambulatory Visit: Payer: Commercial Managed Care - HMO | Attending: Nurse Practitioner | Admitting: Nurse Practitioner

## 2022-07-13 ENCOUNTER — Encounter: Payer: Self-pay | Admitting: Nurse Practitioner

## 2022-07-13 VITALS — BP 149/85 | HR 72 | Ht 64.0 in | Wt 154.8 lb

## 2022-07-13 DIAGNOSIS — Z720 Tobacco use: Secondary | ICD-10-CM

## 2022-07-13 DIAGNOSIS — E1140 Type 2 diabetes mellitus with diabetic neuropathy, unspecified: Secondary | ICD-10-CM | POA: Diagnosis not present

## 2022-07-13 LAB — POCT GLYCOSYLATED HEMOGLOBIN (HGB A1C): HbA1c, POC (controlled diabetic range): 7 % (ref 0.0–7.0)

## 2022-07-13 LAB — GLUCOSE, POCT (MANUAL RESULT ENTRY): POC Glucose: 148 mg/dl — AB (ref 70–99)

## 2022-07-13 MED ORDER — ALBUTEROL SULFATE HFA 108 (90 BASE) MCG/ACT IN AERS: 2.0000 | INHALATION_SPRAY | Freq: Four times a day (QID) | RESPIRATORY_TRACT | 1 refills | Status: DC | PRN

## 2022-07-13 NOTE — Progress Notes (Signed)
Assessment & Plan:  [name] was seen today for diabetes.  Diagnoses and all orders for this visit:  Type 2 diabetes mellitus with diabetic neuropathy, without long-term current use of insulin (HCC) Continue glipizide -     POCT glucose (manual entry) -     POCT glycosylated hemoglobin (Hb A1C) -     CMP14+EGFR -     Microalbumin / creatinine urine ratio  Tobacco user -     albuterol (VENTOLIN HFA) 108 (90 Base) MCG/ACT inhaler; Inhale 2 puffs into the lungs every 6 (six) hours as needed for wheezing.    Patient has been counseled on age-appropriate routine health concerns for screening and prevention. These are reviewed and up-to-date. Referrals have been placed accordingly. Immunizations are up-to-date or declined.    Subjective:   Chief Complaint  Patient presents with   Diabetes   HPI Melissa Harmon 67 y.o. female presents to office today for follow up to DM  Patient has been counseled on age-appropriate routine health concerns for screening and prevention. These are reviewed and up-to-date. Referrals have been placed accordingly. Immunizations are up-to-date or declined.    MAMMOGRAM: UTD.  PAP: UTD 04-2020   She has a history of IBS, Hypothyroidism, DM, OA, Fibromyalgia  DM 2 Improving. She is currently prescribed glipizide XL 5 mg. Doing well. Could not tolerate metformin so this was stopped. Lab Results  Component Value Date   HGBA1C 7.0 07/13/2022    Review of Systems  Constitutional:  Negative for fever, malaise/fatigue and weight loss.  HENT: Negative.  Negative for nosebleeds.   Eyes: Negative.  Negative for blurred vision, double vision and photophobia.  Respiratory: Negative.  Negative for cough and shortness of breath.   Cardiovascular: Negative.  Negative for chest pain, palpitations and leg swelling.  Gastrointestinal: Negative.  Negative for heartburn, nausea and vomiting.  Musculoskeletal: Negative.  Negative for myalgias.  Neurological: Negative.   Negative for dizziness, focal weakness, seizures and headaches.  Psychiatric/Behavioral: Negative.  Negative for suicidal ideas.     Past Medical History:  Diagnosis Date   Bronchitis    Carpal tunnel syndrome on both sides    Family history of breast cancer    Family history of colonic polyps    Family history of lung cancer    Family history of ovarian cancer    Family history of stomach cancer    Fibromyalgia    GERD (gastroesophageal reflux disease)    History of chronic bronchitis    History of Hashimoto thyroiditis    s/p  bilateral thryoidectomy 03/ 2001   Hyperlipidemia    Hypothyroidism, postsurgical followed by pcp   08-04-1999 s/p  bilateral thyroidectomy for multinodular goitar (per path hashimoto's thyroiditis)   Irritable bowel syndrome with constipation    MDD (major depressive disorder)    Pelvic pain    Right ovarian cyst    Type 2 diabetes mellitus ([place])    followed by pcp  (08-24-2019 pt stated checks blood sugar dialy in am,  fasting-- 159-175)   Wears contact lenses    Wears dentures    upper    Past Surgical History:  Procedure Laterality Date   [place]  last one 05-25-2018   LAPAROSCOPIC UNILATERAL SALPINGO OOPHERECTOMY Bilateral 09/01/2019   Procedure: LAPAROSCOPIC BILATERAL SALPINGO OOPHORECTOMY;  Surgeon: Sherlyn Hay, DO;  Location: [facility];  Service: Gynecology;  Laterality: Bilateral;   THYROIDECTOMY Bilateral 08-04-1999  dr Bubba Camp  '@[facility]'$   subtotal   UPPER GASTROINTESTINAL ENDOSCOPY      Family History  Problem Relation Age of Onset   Diabetes Mother    Heart disease Mother    Stroke Mother    Colon cancer Father    Bipolar disorder Father    Colon polyps Father        unknown number   Ovarian cancer Sister 42       or other gynecologic cancer   Schizophrenia Brother    Heart disease Brother    Diverticulitis Maternal Aunt    Stomach cancer Maternal Grandmother        dx ?    Heart attack Maternal Grandmother    Lung cancer Cousin        dx 73s (maternal first cousin)   Breast cancer Cousin        dx 60s/70s (maternal first cousin)   Heart disease Half-Brother    Colon polyps Other 25       >80 polyps (6th degree relative - mother's cousin's grandson)   Esophageal cancer Neg Hx    Rectal cancer Neg Hx     Social History Reviewed with no changes to be made today.   Outpatient Medications Prior to Visit  Medication Sig Dispense Refill   ammonium lactate (AMLACTIN) 12 % lotion Apply 1 Application topically as needed for dry skin. 400 g 0   atorvastatin (LIPITOR) 40 MG tablet Take 40 mg by mouth daily.     Blood Glucose Monitoring Suppl (ONETOUCH VERIO) w/Device KIT Use as instructed. Check blood glucose level by fingerstick twice per day. E11.65 1 kit 0   docusate sodium (COLACE) 100 MG capsule Take 100 mg by mouth 2 (two) times daily as needed for mild constipation.     glipiZIDE (GLUCOTROL XL) 5 MG 24 hr tablet Take 1 tablet (5 mg total) by mouth daily with breakfast. 90 tablet 1   glucose blood test strip Use as instructed. Inject into the skin twice daily. E11.65 100 each 12   ibuprofen (ADVIL) 600 MG tablet Take 1 tablet (600 mg total) by mouth every 6 (six) hours as needed for moderate pain or cramping. 40 tablet 1   Lancet Devices (ONE TOUCH DELICA LANCING DEV) MISC Use as instructed. Inject into the skin twice daily E11.65 1 each prn   levothyroxine (SYNTHROID) 175 MCG tablet Take 175 mcg by mouth daily.     Lidocaine 5 % CREA Apply topically.     naproxen (NAPROSYN) 250 MG tablet Take 250 mg by mouth 2 (two) times daily with a meal.     pantoprazole (PROTONIX) 40 MG tablet Take 1 tablet (40 mg total) by mouth daily. 90 tablet 1   albuterol (VENTOLIN HFA) 108 (90 Base) MCG/ACT inhaler Inhale 2 puffs into the lungs every 6 (six) hours as needed for wheezing. 18 g 1   Facility-Administered Medications Prior to Visit  Medication Dose Route Frequency  Provider Last Rate Last Admin   0.9 %  sodium chloride infusion  500 mL Intravenous Once Nandigam, Venia Minks, MD        Allergies  Allergen Reactions   Gabapentin Other (See Comments)   Metformin And Related Other (See Comments)    GI symptoms; Constipation   Sulfamethoxazole Hives   Other Rash       Objective:    BP (!) 149/85   Pulse 72   Ht '5\' 4"'$  (1.626 m)   Wt 154 lb 12.8 oz (70.2 kg)   SpO2 95%  BMI 26.57 kg/m  Wt Readings from Last 3 Encounters:  07/13/22 154 lb 12.8 oz (70.2 kg)  06/15/22 161 lb (73 kg)  06/09/22 155 lb (70.3 kg)    Physical Exam Vitals and nursing note reviewed.  Constitutional:      Appearance: She is well-developed.  HENT:     Head: Normocephalic and atraumatic.  Cardiovascular:     Rate and Rhythm: Normal rate and regular rhythm.     Heart sounds: Normal heart sounds. No murmur heard.    No friction rub. No gallop.  Pulmonary:     Effort: Pulmonary effort is normal. No tachypnea or respiratory distress.     Breath sounds: Normal breath sounds. No decreased breath sounds, wheezing, rhonchi or rales.  Chest:     Chest wall: No tenderness.  Abdominal:     General: Bowel sounds are normal.     Palpations: Abdomen is soft.  Musculoskeletal:        General: Normal range of motion.     Cervical back: Normal range of motion.  Skin:    General: Skin is warm and dry.  Neurological:     Mental Status: She is alert and oriented to person, place, and time.     Coordination: Coordination normal.  Psychiatric:        Behavior: Behavior normal. Behavior is cooperative.        Thought Content: Thought content normal.        Judgment: Judgment normal.          Patient has been counseled extensively about nutrition and exercise as well as the importance of adherence with medications and regular follow-up. The patient was given clear instructions to go to ER or return to medical center if symptoms don't improve, worsen or new problems develop.  The patient verbalized understanding.   Follow-up: Return in about 3 months (around 10/13/2022).   Gildardo Pounds, FNP-BC [facility] [place], [place]   07/13/2022, 12:01 PM

## 2022-07-14 LAB — MICROALBUMIN / CREATININE URINE RATIO
Creatinine, Urine: 87.2 mg/dL
Microalb/Creat Ratio: 14 mg/g creat (ref 0–29)
Microalbumin, Urine: 12.4 ug/mL

## 2022-07-14 LAB — CMP14+EGFR
ALT: 43 IU/L — ABNORMAL HIGH (ref 0–32)
AST: 32 IU/L (ref 0–40)
Albumin/Globulin Ratio: 1.6 (ref 1.2–2.2)
Albumin: 4.1 g/dL (ref 3.9–4.9)
Alkaline Phosphatase: 130 IU/L — ABNORMAL HIGH (ref 44–121)
BUN/Creatinine Ratio: 14 (ref 12–28)
BUN: 10 mg/dL (ref 8–27)
Bilirubin Total: 0.4 mg/dL (ref 0.0–1.2)
CO2: 26 mmol/L (ref 20–29)
Calcium: 9.3 mg/dL (ref 8.7–10.3)
Chloride: 104 mmol/L (ref 96–106)
Creatinine, Ser: 0.7 mg/dL (ref 0.57–1.00)
Globulin, Total: 2.6 g/dL (ref 1.5–4.5)
Glucose: 111 mg/dL — ABNORMAL HIGH (ref 70–99)
Potassium: 4.3 mmol/L (ref 3.5–5.2)
Sodium: 142 mmol/L (ref 134–144)
Total Protein: 6.7 g/dL (ref 6.0–8.5)
eGFR: 97 mL/min/{1.73_m2} (ref >59)

## 2022-07-27 ENCOUNTER — Other Ambulatory Visit: Payer: Self-pay | Admitting: Nurse Practitioner

## 2022-07-27 DIAGNOSIS — E1140 Type 2 diabetes mellitus with diabetic neuropathy, unspecified: Secondary | ICD-10-CM

## 2022-07-27 NOTE — Telephone Encounter (Signed)
Medication Refill - Medication: Lancet Devices (ONE TOUCH DELICA LANCING DEV) MISC   (patient states she has two left)   Has the patient contacted their pharmacy? Yes.   Preferred Pharmacy (with phone number or street name):  [facility] [place], [place] [address] Phone: [phone]  Fax: [phone]   Has the patient been seen for an appointment in the last year OR does the patient have an upcoming appointment? Yes.    Agent: Please be advised that RX refills may take up to 3 business days. We ask that you follow-up with your pharmacy.

## 2022-07-28 MED ORDER — ONETOUCH DELICA LANCING DEV MISC: 99 refills | Status: DC

## 2022-07-28 NOTE — Telephone Encounter (Signed)
Requested Prescriptions  Pending Prescriptions Disp Refills   Lancet Devices (ONE TOUCH DELICA LANCING DEV) MISC 1 each prn    Sig: Use as instructed. Inject into the skin twice daily E11.65     Endocrinology: Diabetes - Testing Supplies Passed - 07/27/2022  4:01 PM      Passed - Valid encounter within last 12 months    Recent Outpatient Visits           2 weeks ago Type 2 diabetes mellitus with diabetic neuropathy, without long-term current use of insulin [facility])   [place], [place] W, NP   1 month ago Type 2 diabetes mellitus with diabetic neuropathy, without long-term current use of insulin [facility])   [place], [place] W, NP   1 year ago Type 2 diabetes mellitus with diabetic neuropathy, without long-term current use of insulin [facility])   [place], [place] L, RPH-CPP   2 years ago Type 2 diabetes mellitus with diabetic neuropathy, without long-term current use of insulin [facility])   [place], [place] L, RPH-CPP   2 years ago Type 2 diabetes mellitus with diabetic neuropathy, without long-term current use of insulin [facility])   [place], Vernia Buff, NP       Future Appointments             In 2 months Gildardo Pounds, NP [place]   In 4 months Bo Merino, MD [facility]

## 2022-07-29 NOTE — Telephone Encounter (Addendum)
Pt stated we sent in the wrong things stated she picked up the thing that you stick the needles in and stated I don't need the strips; I need lancets. I am unsure if we made a mistake or the pharmacy.   Please advise.

## 2022-08-04 ENCOUNTER — Telehealth: Payer: Self-pay | Admitting: Nurse Practitioner

## 2022-08-04 DIAGNOSIS — E1165 Type 2 diabetes mellitus with hyperglycemia: Secondary | ICD-10-CM

## 2022-08-04 MED ORDER — ONETOUCH DELICA LANCETS 33G MISC: 2 refills | Status: DC

## 2022-08-04 NOTE — Telephone Encounter (Signed)
The patient previously requested lancets in which she last got from a different provider but she received the wrong thing as she received the device in which you put the lancets in not the lancets. Please assist patient further as she uses  [place], [place] Phone: [phone]  Fax: [phone]

## 2022-08-04 NOTE — Addendum Note (Signed)
Addended by: Abbie Sons L on: 08/04/2022 04:39 PM   Modules accepted: Orders

## 2022-08-04 NOTE — Telephone Encounter (Signed)
Patient states that she is requesting a refill for lancets. A refill for lancet devices (One touch delica lancing dev) misc was sent to [facility] on 07/28/22. Patient states that was not what she needed and is requesting only the lancets. Lancets not on current medication list, routing for approval.

## 2022-08-05 ENCOUNTER — Other Ambulatory Visit: Payer: Self-pay | Admitting: Family Medicine

## 2022-08-05 DIAGNOSIS — E1165 Type 2 diabetes mellitus with hyperglycemia: Secondary | ICD-10-CM

## 2022-08-05 MED ORDER — ACCU-CHEK SOFTCLIX LANCETS MISC: 2 refills | Status: DC

## 2022-08-05 MED ORDER — ACCU-CHEK GUIDE VI STRP: ORAL_STRIP | 2 refills | Status: DC

## 2022-08-05 MED ORDER — ACCU-CHEK GUIDE W/DEVICE KIT: PACK | 0 refills | Status: DC

## 2022-08-14 ENCOUNTER — Other Ambulatory Visit: Payer: Self-pay | Admitting: Podiatry

## 2022-08-14 ENCOUNTER — Ambulatory Visit (INDEPENDENT_AMBULATORY_CARE_PROVIDER_SITE_OTHER): Payer: Medicaid Other | Admitting: Podiatry

## 2022-08-14 ENCOUNTER — Encounter: Payer: Self-pay | Admitting: Podiatry

## 2022-08-14 DIAGNOSIS — E1140 Type 2 diabetes mellitus with diabetic neuropathy, unspecified: Secondary | ICD-10-CM

## 2022-08-14 DIAGNOSIS — M79674 Pain in right toe(s): Secondary | ICD-10-CM | POA: Diagnosis not present

## 2022-08-14 DIAGNOSIS — B351 Tinea unguium: Secondary | ICD-10-CM | POA: Diagnosis not present

## 2022-08-14 DIAGNOSIS — Z794 Long term (current) use of insulin: Secondary | ICD-10-CM

## 2022-08-14 DIAGNOSIS — M79675 Pain in left toe(s): Secondary | ICD-10-CM

## 2022-08-14 MED ORDER — AMMONIUM LACTATE 12 % EX LOTN: 1.0000 | TOPICAL_LOTION | CUTANEOUS | 0 refills | Status: DC | PRN

## 2022-08-14 NOTE — Progress Notes (Signed)

## 2022-08-15 ENCOUNTER — Other Ambulatory Visit: Payer: Self-pay | Admitting: Podiatry

## 2022-08-17 ENCOUNTER — Other Ambulatory Visit: Payer: Self-pay | Admitting: Nurse Practitioner

## 2022-08-17 ENCOUNTER — Other Ambulatory Visit: Payer: Self-pay

## 2022-08-17 MED ORDER — LEVOTHYROXINE SODIUM 175 MCG PO TABS
175.0000 ug | ORAL_TABLET | Freq: Every day | ORAL | 1 refills | Status: DC
  Filled 2022-08-17: qty 90, 90d supply, fill #0

## 2022-08-17 MED ORDER — AMMONIUM LACTATE 12 % EX LOTN: 1.0000 | TOPICAL_LOTION | CUTANEOUS | 0 refills | Status: DC | PRN

## 2022-08-25 ENCOUNTER — Other Ambulatory Visit: Payer: Self-pay | Admitting: Nurse Practitioner

## 2022-08-25 LAB — HM DIABETES EYE EXAM

## 2022-08-26 ENCOUNTER — Telehealth: Payer: Self-pay | Admitting: Nurse Practitioner

## 2022-08-26 MED ORDER — NAPROXEN 250 MG PO TABS: 250.0000 mg | ORAL_TABLET | Freq: Two times a day (BID) | ORAL | 0 refills | Status: DC

## 2022-08-26 MED ORDER — LEVOTHYROXINE SODIUM 175 MCG PO TABS: 175.0000 ug | ORAL_TABLET | Freq: Every day | ORAL | 1 refills | Status: DC

## 2022-08-26 NOTE — Telephone Encounter (Signed)
Patient returned the call and confirmed [facility] the preferred pharmacy, see TE 08/26/22.

## 2022-08-26 NOTE — Telephone Encounter (Signed)
Copied from CRM [id number]. Topic: General - Other >> Aug 26, 2022 10:31 AM Everette C wrote: Reason for CRM: The patient has been directed to contact their PCP and request that their levothyroxine (SYNTHROID) 175 MCG tablet [[id number]] prescription be resubmitted   [facility] [address] [place], [place] - [address] [place] [address] Phone: [phone] Fax: [phone] Hours: Not open 24 hours  Please contact the patient's pharmacy further when possible

## 2022-08-26 NOTE — Telephone Encounter (Signed)
Rx sent by provider

## 2022-08-26 NOTE — Telephone Encounter (Signed)
Addressed this in the refill encounter on 08/25/22, sent to [facility] today.

## 2022-08-26 NOTE — Telephone Encounter (Signed)
Patient called, left VM to call back to verify her pharmacy. Call below says she uses [facility] and that is the only pharmacy listed in preferred pharmacy list. Just need to make sure Melissa Harmon is the preferred and not [facility] [place], since she lives in [place].

## 2022-08-26 NOTE — Telephone Encounter (Signed)
Disp Refills Start End   levothyroxine (SYNTHROID) 175 MCG tablet       Please note this script, not sure if in error, pt has existing TE where sent on 4/16 by Meredeth Ide to [facility]. It is not a preferred pharmacy???? She uses the [facility] on file and does not know where [facility] pharmacy is located. Does not live in [place]. [facility] [place], [place] - [address] Phone: [phone]  Fax: [phone]

## 2022-08-26 NOTE — Telephone Encounter (Signed)
Medication Refill - Medication: levothyroxine (SYNTHROID) 175 MCG tablet  Has the patient contacted their pharmacy? No. Pt is calling back to confirm that the correct Pharmacy is [facility].    Preferred Pharmacy (with phone number or street name):  [facility] - [place], [place] - [address] Phone: [phone]  Fax: [phone]     Has the patient been seen for an appointment in the last year OR does the patient have an upcoming appointment? Yes.    Agent: Please be advised that RX refills may take up to 3 business days. We ask that you follow-up with your pharmacy.

## 2022-08-26 NOTE — Telephone Encounter (Signed)
Requested medication (s) are due for refill today: Yes  Requested medication (s) are on the active medication list: Yes  Last refill:  06/15/22  Future visit scheduled:Yes  Notes to clinic:  Unable to refill per protocol, last refill by another provider.      Requested Prescriptions  Pending Prescriptions Disp Refills   naproxen (NAPROSYN) 250 MG tablet      Sig: Take 1 tablet (250 mg total) by mouth 2 (two) times daily with a meal.     Analgesics:  NSAIDS Failed - 08/26/2022  9:14 AM      Failed - Manual Review: Labs are only required if the patient has taken medication for more than 8 weeks.      Passed - Cr in normal range and within 360 days    Creat  Date Value Ref Range Status  02/20/2022 0.76 0.50 - 1.05 mg/dL Final   Creatinine, Ser  Date Value Ref Range Status  07/13/2022 0.70 0.57 - 1.00 mg/dL Final         Passed - HGB in normal range and within 360 days    Hemoglobin  Date Value Ref Range Status  02/20/2022 14.4 11.7 - 15.5 g/dL Final         Passed - PLT in normal range and within 360 days    Platelets  Date Value Ref Range Status  02/20/2022 257 140 - 400 Thousand/uL Final   Platelet Count, POC  Date Value Ref Range Status  05/26/2016 304 142 - 424 K/uL Final         Passed - HCT in normal range and within 360 days    HCT  Date Value Ref Range Status  02/20/2022 43.7 35.0 - 45.0 % Final         Passed - eGFR is 30 or above and within 360 days    GFR calc Af Amer  Date Value Ref Range Status  06/07/2020 94 >59 mL/min/1.73 Final    Comment:    **In accordance with recommendations from the NKF-ASN Task force,**   [facility] is in the process of updating its eGFR calculation to the   2021 CKD-EPI creatinine equation that estimates kidney function   without a race variable.    GFR calc non Af Amer  Date Value Ref Range Status  06/07/2020 82 >59 mL/min/1.73 Final   GFR  Date Value Ref Range Status  11/14/2018 96.56 >60.00 mL/min Final   eGFR   Date Value Ref Range Status  07/13/2022 97 >59 mL/min/1.73 Final         Passed - Patient is not pregnant      Passed - Valid encounter within last 12 months    Recent Outpatient Visits           1 month ago Type 2 diabetes mellitus with diabetic neuropathy, without long-term current use of insulin (HCC)   [place] [facility] [place], [place] W, NP   2 months ago Type 2 diabetes mellitus with diabetic neuropathy, without long-term current use of insulin [facility])   [place] [facility] [place], [place] W, NP   2 years ago Type 2 diabetes mellitus with diabetic neuropathy, without long-term current use of insulin [facility])   [place] [facility] [place], [place] L, RPH-CPP   2 years ago Type 2 diabetes mellitus with diabetic neuropathy, without long-term current use of insulin (HCC)   [facility]  [place] Chalet, RPH-CPP   2 years ago Type 2 diabetes mellitus with diabetic neuropathy, without long-term current use of insulin [facility])   [place] [facility] Claiborne Rigg, NP       Future Appointments             In 1 month Claiborne Rigg, NP [facility]   In 3 months Deveshwar, Janalyn Rouse, MD [facility]            Signed Prescriptions Disp Refills   levothyroxine (SYNTHROID) 175 MCG tablet 90 tablet 1    Sig: Take 1 tablet (175 mcg total) by mouth daily.     Endocrinology:  Hypothyroid Agents Passed - 08/26/2022  9:14 AM      Passed - TSH in normal range and within 360 days    TSH  Date Value Ref Range Status  06/09/2022 1.460 0.450 - 4.500 uIU/mL Final         Passed - Valid encounter within last 12 months    Recent Outpatient Visits           1 month ago Type 2 diabetes mellitus with diabetic neuropathy, without long-term current use of insulin [facility])   [place] [facility] [place], [place] W, NP   2 months ago Type 2 diabetes mellitus with diabetic neuropathy, without long-term current use of insulin [facility])   [place] [facility] [place], [place] W, NP   2 years ago Type 2 diabetes mellitus with diabetic neuropathy, without long-term current use of insulin [facility])   [place] [facility] [place], [place] L, RPH-CPP   2 years ago Type 2 diabetes mellitus with diabetic neuropathy, without long-term current use of insulin [facility])   [place] [facility] [place], [place] L, RPH-CPP   2 years ago Type 2 diabetes mellitus with diabetic neuropathy, without long-term current use of insulin [facility])   [place] [facility] [place], Shea Stakes, NP       Future Appointments             In 1 month Claiborne Rigg, NP [facility]   In 3 months Pollyann Savoy, MD [facility]

## 2022-08-26 NOTE — Telephone Encounter (Signed)
Need to contact patient to confirm pharmacy to send Rx requests.

## 2022-08-28 ENCOUNTER — Telehealth: Payer: Self-pay

## 2022-08-28 ENCOUNTER — Other Ambulatory Visit: Payer: Self-pay

## 2022-08-28 NOTE — Telephone Encounter (Signed)
Copied from CRM [id number]. Topic: General - Inquiry >> Aug 28, 2022 10:38 AM Carrielelia G wrote: Reason for CRM:  patient calling back stating that she can not pick up her medication in [place] because it is still active at: [facility] [address], [place] [address]  Phone: [phone]  Fax: [phone] DEA #: [id number] [facility] pharmacy will not give patient medication until the [facility] pharmacy request is deactivated.

## 2022-08-28 NOTE — Telephone Encounter (Signed)
Issue resolved. Patient aware

## 2022-08-29 ENCOUNTER — Other Ambulatory Visit: Payer: Self-pay | Admitting: Family Medicine

## 2022-08-31 MED ORDER — ACCU-CHEK GUIDE W/DEVICE KIT: PACK | 0 refills | Status: DC

## 2022-09-01 ENCOUNTER — Ambulatory Visit: Payer: Self-pay | Admitting: *Deleted

## 2022-09-01 ENCOUNTER — Other Ambulatory Visit: Payer: Self-pay | Admitting: Pharmacist

## 2022-09-01 MED ORDER — ACCU-CHEK GUIDE W/DEVICE KIT: PACK | 0 refills | Status: DC

## 2022-09-01 MED ORDER — ACCU-CHEK SOFTCLIX LANCETS MISC: 2 refills | Status: DC

## 2022-09-01 MED ORDER — ACCU-CHEK GUIDE VI STRP: ORAL_STRIP | 6 refills | Status: DC

## 2022-09-01 NOTE — Telephone Encounter (Signed)
Pharmacy [facility] calling to request clarification / reorder of accu chek softclix lancets  and test strips to state to check blood glucose 2 times a day. Pharmacy staff Corrie Dandy reports patient states she is checking BS 2 times daily and not once daily as ordered. Do not want patient to run out of supplies early. In review of chart looks like 3 orders were placed for accu chek guide w/ device kits ordered. Is this correct? Please advise.     Reason for Disposition  [1] Pharmacy calling with prescription question AND [2] triager unable to answer question  Answer Assessment - Initial Assessment Questions 1. NAME of MEDICINE: "What medicine(s) are you calling about?"     Accu check soft clix lancets and test strips  2. QUESTION: "What is your question?" (e.g., double dose of medicine, side effect)     Pharmacy calling to request order change due to patient checking blood sugar 2 times daily and not 1 time daily. 3. PRESCRIBER: "Who prescribed the medicine?" Reason: if prescribed by specialist, call should be referred to that group.     PCP 4. SYMPTOMS: "Do you have any symptoms?" If Yes, ask: "What symptoms are you having?"  "How bad are the symptoms (e.g., mild, moderate, severe)     na 5. PREGNANCY:  "Is there any chance that you are pregnant?" "When was your last menstrual period?"     na  Protocols used: Medication Question Call-A-AH

## 2022-09-21 ENCOUNTER — Ambulatory Visit: Payer: Commercial Managed Care - HMO | Admitting: Nurse Practitioner

## 2022-10-03 ENCOUNTER — Other Ambulatory Visit: Payer: Self-pay | Admitting: Family Medicine

## 2022-10-05 MED ORDER — NAPROXEN 250 MG PO TABS: 250.0000 mg | ORAL_TABLET | Freq: Two times a day (BID) | ORAL | 0 refills | Status: DC

## 2022-10-08 ENCOUNTER — Other Ambulatory Visit: Payer: Self-pay | Admitting: Family Medicine

## 2022-10-08 MED ORDER — ACCU-CHEK GUIDE W/DEVICE KIT: PACK | 0 refills | Status: DC

## 2022-10-12 ENCOUNTER — Encounter: Payer: Self-pay | Admitting: Nurse Practitioner

## 2022-10-12 ENCOUNTER — Ambulatory Visit: Payer: Medicaid Other | Attending: Nurse Practitioner | Admitting: Nurse Practitioner

## 2022-10-12 VITALS — BP 128/79 | HR 76 | Ht 63.5 in | Wt 150.6 lb

## 2022-10-12 DIAGNOSIS — R748 Abnormal levels of other serum enzymes: Secondary | ICD-10-CM | POA: Diagnosis not present

## 2022-10-12 DIAGNOSIS — Z7984 Long term (current) use of oral hypoglycemic drugs: Secondary | ICD-10-CM

## 2022-10-12 DIAGNOSIS — L209 Atopic dermatitis, unspecified: Secondary | ICD-10-CM | POA: Diagnosis not present

## 2022-10-12 DIAGNOSIS — E1140 Type 2 diabetes mellitus with diabetic neuropathy, unspecified: Secondary | ICD-10-CM

## 2022-10-12 DIAGNOSIS — E1165 Type 2 diabetes mellitus with hyperglycemia: Secondary | ICD-10-CM | POA: Diagnosis not present

## 2022-10-12 LAB — POCT GLYCOSYLATED HEMOGLOBIN (HGB A1C): HbA1c, POC (controlled diabetic range): 6.6 % (ref 0.0–7.0)

## 2022-10-12 LAB — GLUCOSE, POCT (MANUAL RESULT ENTRY): POC Glucose: 160 mg/dl — AB (ref 70–99)

## 2022-10-12 MED ORDER — TRIAMCINOLONE ACETONIDE 0.025 % EX OINT
1.0000 | TOPICAL_OINTMENT | Freq: Two times a day (BID) | CUTANEOUS | 1 refills | Status: DC
Start: 2022-10-12 — End: 2023-01-12

## 2022-10-12 NOTE — Progress Notes (Signed)
Assessment & Plan:  Melissa Harmon was seen today for diabetes.  Diagnoses and all orders for this visit:  Type 2 diabetes mellitus with hyperglycemia, without long-term current use of insulin (HCC) -     POCT glucose (manual entry) -     POCT glycosylated hemoglobin (Hb A1C)  Elevated liver enzymes -     Hepatic Function Panel  Atopic dermatitis, unspecified type -     triamcinolone (KENALOG) 0.025 % ointment; Apply 1 Application topically 2 (two) times daily.    Patient has been counseled on age-appropriate routine health concerns for screening and prevention. These are reviewed and up-to-date. Referrals have been placed accordingly. Immunizations are up-to-date or declined.    Subjective:   Chief Complaint  Patient presents with   Diabetes   HPI Melissa Harmon 67 y.o. female presents to office today for follow-up to diabetes.  Patient has been counseled on age-appropriate routine health concerns for screening and prevention. These are reviewed and up-to-date. Referrals have been placed accordingly. Immunizations are up-to-date or declined.    MAMMOGRAM: UTD.  PAP: UTD 04-2020   She has a history of IBS, Hypothyroidism, DM, OA, Fibromyalgia  DM 2 Well-controlled.  Due to hypoglycemia she is now only taking half a glipizide which is 2.5 mg daily.  Cannot tolerate metformin due to GI upset. Lab Results  Component Value Date   HGBA1C 6.6 10/12/2022    Lab Results  Component Value Date   HGBA1C 7.0 07/13/2022  LDL not at goal with high intensity statin.  She does endorse drinking alcohol. Lab Results  Component Value Date   LDLCALC 185 (H) 05/20/2009     Hypothyroidism Thyroid levels at goal with levothyroxine 175 mg daily.  She denies any signs of hypo or hyperthyroidism Lab Results  Component Value Date   TSH 1.460 06/09/2022     Review of Systems  Constitutional:  Negative for fever, malaise/fatigue and weight loss.  HENT: Negative.  Negative for nosebleeds.    Eyes: Negative.  Negative for blurred vision, double vision and photophobia.  Respiratory:  Positive for cough (smokers cough) and sputum production. Negative for hemoptysis, shortness of breath and wheezing.   Cardiovascular: Negative.  Negative for chest pain, palpitations and leg swelling.  Gastrointestinal: Negative.  Negative for heartburn, nausea and vomiting.  Musculoskeletal: Negative.  Negative for myalgias.  Skin:  Positive for itching and rash.       Notes an area of dry patchy skin near the left shoulder.  Neurological: Negative.  Negative for dizziness, focal weakness, seizures and headaches.  Psychiatric/Behavioral: Negative.  Negative for suicidal ideas.     Past Medical History:  Diagnosis Date   Bronchitis    Carpal tunnel syndrome on both sides    Family history of breast cancer    Family history of colonic polyps    Family history of lung cancer    Family history of ovarian cancer    Family history of stomach cancer    Fibromyalgia    GERD (gastroesophageal reflux disease)    History of chronic bronchitis    History of Hashimoto thyroiditis    s/p  bilateral thryoidectomy 03/ 2001   Hyperlipidemia    Hypothyroidism, postsurgical followed by pcp   08-04-1999 s/p  bilateral thyroidectomy for multinodular goitar (per path hashimoto's thyroiditis)   Irritable bowel syndrome with constipation    MDD (major depressive disorder)    Pelvic pain    Right ovarian cyst    Type 2 diabetes mellitus (  HCC)    followed by pcp  (08-24-2019 pt stated checks blood sugar dialy in am,  fasting-- 159-175)   Wears contact lenses    Wears dentures    upper    Past Surgical History:  Procedure Laterality Date   CESAREAN SECTION  1986   COLONOSCOPY  last one 05-25-2018   LAPAROSCOPIC UNILATERAL SALPINGO OOPHERECTOMY Bilateral 09/01/2019   Procedure: LAPAROSCOPIC BILATERAL SALPINGO OOPHORECTOMY;  Surgeon: Edwinna Areola, DO;  Location: [facility];  Service:  Gynecology;  Laterality: Bilateral;   THYROIDECTOMY Bilateral 08-04-1999  dr Lurene Shadow  @[facility]    subtotal   UPPER GASTROINTESTINAL ENDOSCOPY      Family History  Problem Relation Age of Onset   Diabetes Mother    Heart disease Mother    Stroke Mother    Colon cancer Father    Bipolar disorder Father    Colon polyps Father        unknown number   Ovarian cancer Sister 1       or other gynecologic cancer   Schizophrenia Brother    Heart disease Brother    Diverticulitis Maternal Aunt    Stomach cancer Maternal Grandmother        dx ?   Heart attack Maternal Grandmother    Lung cancer Cousin        dx 12s (maternal first cousin)   Breast cancer Cousin        dx 60s/70s (maternal first cousin)   Heart disease Half-Brother    Colon polyps Other 25       >80 polyps (6th degree relative - mother's cousin's grandson)   Esophageal cancer Neg Hx    Rectal cancer Neg Hx     Social History Reviewed with no changes to be made today.   Outpatient Medications Prior to Visit  Medication Sig Dispense Refill   Accu-Chek Softclix Lancets lancets Use to check blood sugar twice daily. E11.65 100 each 2   albuterol (VENTOLIN HFA) 108 (90 Base) MCG/ACT inhaler Inhale 2 puffs into the lungs every 6 (six) hours as needed for wheezing. 18 g 1   ammonium lactate (AMLACTIN) 12 % lotion Apply 1 Application topically as needed for dry skin. 400 g 0   atorvastatin (LIPITOR) 40 MG tablet Take 40 mg by mouth daily.     Blood Glucose Monitoring Suppl (ACCU-CHEK GUIDE) w/Device KIT Use to check blood sugar twice daily. E11.65 1 kit 0   docusate sodium (COLACE) 100 MG capsule Take 100 mg by mouth 2 (two) times daily as needed for mild constipation.     glipiZIDE (GLUCOTROL XL) 5 MG 24 hr tablet Take 1 tablet (5 mg total) by mouth daily with breakfast. (Patient taking differently: Take 2.5 mg by mouth daily with breakfast.) 90 tablet 1   glucose blood (ACCU-CHEK GUIDE) test strip Use to check blood sugar once  daily. E11.65 100 each 2   glucose blood (ACCU-CHEK GUIDE) test strip Use to check blood sugar twice daily. 100 each 6   levothyroxine (SYNTHROID) 175 MCG tablet Take 1 tablet (175 mcg total) by mouth daily. 90 tablet 1   Lidocaine 5 % CREA Apply topically.     naproxen (NAPROSYN) 250 MG tablet Take 1 tablet (250 mg total) by mouth 2 (two) times daily with a meal. 60 tablet 0   pantoprazole (PROTONIX) 40 MG tablet Take 1 tablet (40 mg total) by mouth daily. 90 tablet 1   Facility-Administered Medications Prior to Visit  Medication Dose Route  Frequency Provider Last Rate Last Admin   0.9 %  sodium chloride infusion  500 mL Intravenous Once Nandigam, Eleonore Chiquito, MD        Allergies  Allergen Reactions   Gabapentin Other (See Comments)   Metformin And Related Other (See Comments)    GI symptoms; Constipation   Sulfamethoxazole Hives   Other Rash       Objective:    BP 128/79   Pulse 76   Ht 5' 3.5" (1.613 m)   Wt 150 lb 9.6 oz (68.3 kg)   SpO2 99%   BMI 26.26 kg/m  Wt Readings from Last 3 Encounters:  10/12/22 150 lb 9.6 oz (68.3 kg)  07/13/22 154 lb 12.8 oz (70.2 kg)  06/15/22 161 lb (73 kg)    Physical Exam Vitals and nursing note reviewed.  Constitutional:      Appearance: She is well-developed.  HENT:     Head: Normocephalic and atraumatic.  Cardiovascular:     Rate and Rhythm: Normal rate and regular rhythm.     Heart sounds: Normal heart sounds. No murmur heard.    No friction rub. No gallop.  Pulmonary:     Effort: Pulmonary effort is normal. No tachypnea or respiratory distress.     Breath sounds: Normal breath sounds. No decreased breath sounds, wheezing, rhonchi or rales.  Chest:     Chest wall: No tenderness.  Abdominal:     General: Bowel sounds are normal.     Palpations: Abdomen is soft.  Musculoskeletal:        General: Normal range of motion.     Cervical back: Normal range of motion.  Skin:    General: Skin is warm and dry.  Neurological:      Mental Status: She is alert and oriented to person, place, and time.     Coordination: Coordination normal.  Psychiatric:        Behavior: Behavior normal. Behavior is cooperative.        Thought Content: Thought content normal.        Judgment: Judgment normal.          Patient has been counseled extensively about nutrition and exercise as well as the importance of adherence with medications and regular follow-up. The patient was given clear instructions to go to ER or return to medical center if symptoms don't improve, worsen or new problems develop. The patient verbalized understanding.   Follow-up: Return in about 3 months (around 01/12/2023).   Claiborne Rigg, FNP-BC [facility] [place], [place] [phone]   10/12/2022, 12:28 PM

## 2022-10-13 LAB — HEPATIC FUNCTION PANEL
ALT: 56 IU/L — ABNORMAL HIGH (ref 0–32)
AST: 31 IU/L (ref 0–40)
Albumin: 4.1 g/dL (ref 3.9–4.9)
Alkaline Phosphatase: 150 IU/L — ABNORMAL HIGH (ref 44–121)
Bilirubin Total: 0.3 mg/dL (ref 0.0–1.2)
Bilirubin, Direct: <0.1 mg/dL (ref 0.00–0.40)
Total Protein: 6.7 g/dL (ref 6.0–8.5)

## 2022-10-15 ENCOUNTER — Other Ambulatory Visit: Payer: Self-pay | Admitting: Nurse Practitioner

## 2022-10-15 DIAGNOSIS — R748 Abnormal levels of other serum enzymes: Secondary | ICD-10-CM

## 2022-11-16 ENCOUNTER — Ambulatory Visit (INDEPENDENT_AMBULATORY_CARE_PROVIDER_SITE_OTHER): Payer: Medicare Other | Admitting: Podiatry

## 2022-11-16 ENCOUNTER — Encounter: Payer: Self-pay | Admitting: Podiatry

## 2022-11-16 DIAGNOSIS — M79674 Pain in right toe(s): Secondary | ICD-10-CM | POA: Diagnosis not present

## 2022-11-16 DIAGNOSIS — M79675 Pain in left toe(s): Secondary | ICD-10-CM | POA: Diagnosis not present

## 2022-11-16 DIAGNOSIS — B351 Tinea unguium: Secondary | ICD-10-CM | POA: Diagnosis not present

## 2022-11-16 DIAGNOSIS — E1140 Type 2 diabetes mellitus with diabetic neuropathy, unspecified: Secondary | ICD-10-CM

## 2022-11-16 DIAGNOSIS — Z794 Long term (current) use of insulin: Secondary | ICD-10-CM

## 2022-11-16 NOTE — Progress Notes (Signed)

## 2022-11-17 NOTE — Progress Notes (Signed)
Office Visit Note  Patient: Melissa Harmon             Date of Birth: 1955/12/28           MRN: [id number]             PCP: Claiborne Rigg, NP Referring: Donato Schultz, FNP Visit Date: 11/30/2022 Occupation: @GUAROCC @  Subjective:  Left shoulder pain  History of Present Illness: Melissa Harmon is a 67 y.o. female with osteoarthritis and fibromyalgia syndrome.  She returns today after her last visit on May 12, 2022.  She has been followed by Dr. Stacie Acres, podiatrist for her feet discomfort.  She has been seeing Dr. Collene Schlichter for nail dystrophy, onychomycosis and fifth toe hallux deformity.  She has been using topical AmLactin lotion.  She continues to have some stiffness in her hands.  She states off-and-on she has been experiencing discomfort in her left arm.  The pain is worse when she sleeps on her left side.  She states she woke up with a stiffness in her left arm today.  She has intermittent flares of fibromyalgia.    Activities of Daily Living:  Patient reports morning stiffness for 1 hour.   Patient Reports nocturnal pain.  Difficulty dressing/grooming: Reports Difficulty climbing stairs: Reports Difficulty getting out of chair: Denies Difficulty using hands for taps, buttons, cutlery, and/or writing: Reports  Review of Systems  Constitutional:  Positive for fatigue.  HENT:  Negative for mouth sores and mouth dryness.   Eyes:  Negative for dryness.  Respiratory:  Negative for difficulty breathing.   Cardiovascular:  Negative for chest pain and palpitations.  Gastrointestinal:  Negative for blood in stool, constipation and diarrhea.  Endocrine: Negative for increased urination.  Genitourinary:  Negative for involuntary urination.  Musculoskeletal:  Positive for joint pain, joint pain, myalgias, morning stiffness, muscle tenderness and myalgias. Negative for gait problem, joint swelling and muscle weakness.  Skin:  Negative for color change, rash, hair loss and sensitivity to  sunlight.  Allergic/Immunologic: Negative for susceptible to infections.  Neurological:  Negative for dizziness and headaches.  Hematological:  Negative for swollen glands.  Psychiatric/Behavioral:  Negative for depressed mood and sleep disturbance. The patient is nervous/anxious.     PMFS History:  Patient Active Problem List   Diagnosis Date Noted   Pain due to onychomycosis of toenails of both feet 05/15/2022   Anxiety 11/26/2021   Atopic dermatitis 11/26/2021   Irritable bowel syndrome with constipation 11/26/2021   Type 2 diabetes mellitus with hyperglycemia (HCC) 11/26/2021   Unspecified condition associated with female genital organs and menstrual cycle 11/26/2021   Diabetes mellitus with neuropathy (HCC) 06/07/2020   Diabetic neuropathy (HCC) 06/07/2020   Genetic testing 05/22/2020   Family history of colonic polyps    Family history of ovarian cancer    Family history of stomach cancer    Family history of lung cancer    Family history of breast cancer    Adjustment disorder with anxious mood 09/18/2017   Insomnia due to other mental disorder 09/18/2017   DYSURIA 10/25/2009   ABDOMINAL PAIN, RIGHT UPPER QUADRANT 10/25/2009   NECK PAIN, LEFT 08/23/2009   HAND PAIN, RIGHT 08/23/2009   ELBOW PAIN, BILATERAL 05/17/2009   Tobacco user 05/13/2009   DIABETES MELLITUS, TYPE II, WITHOUT COMPLICATIONS 12/14/2008   DYSLIPIDEMIA 12/14/2008   SHORTNESS OF BREATH 12/14/2008   RECTAL BLEEDING 04/22/2007   COLONIC POLYPS, HX OF 04/22/2007   BLOOD IN STOOL 04/15/2007  PITYRIASIS ROSEA 08/09/2006   HYPOTHYROIDISM, UNSPECIFIED 07/08/2006   Esophageal reflux 07/08/2006   FIBROMYALGIA, FIBROMYOSITIS 07/08/2006    Past Medical History:  Diagnosis Date   Bronchitis    Carpal tunnel syndrome on both sides    Family history of breast cancer    Family history of colonic polyps    Family history of lung cancer    Family history of ovarian cancer    Family history of stomach cancer     Fibromyalgia    GERD (gastroesophageal reflux disease)    History of chronic bronchitis    History of Hashimoto thyroiditis    s/p  bilateral thryoidectomy 03/ 2001   Hyperlipidemia    Hypothyroidism, postsurgical followed by pcp   08-04-1999 s/p  bilateral thyroidectomy for multinodular goitar (per path hashimoto's thyroiditis)   Irritable bowel syndrome with constipation    MDD (major depressive disorder)    Pelvic pain    Right ovarian cyst    Type 2 diabetes mellitus (HCC)    followed by pcp  (08-24-2019 pt stated checks blood sugar dialy in am,  fasting-- 159-175)   Wears contact lenses    Wears dentures    upper    Family History  Problem Relation Age of Onset   Diabetes Mother    Heart disease Mother    Stroke Mother    Colon cancer Father    Bipolar disorder Father    Colon polyps Father        unknown number   Ovarian cancer Sister 43       or other gynecologic cancer   Schizophrenia Brother    Heart disease Brother    Diverticulitis Maternal Aunt    Stomach cancer Maternal Grandmother        dx ?   Heart attack Maternal Grandmother    Lung cancer Cousin        dx 28s (maternal first cousin)   Breast cancer Cousin        dx 60s/70s (maternal first cousin)   Heart disease Half-Brother    Colon polyps Other 25       >80 polyps (6th degree relative - mother's cousin's grandson)   Esophageal cancer Neg Hx    Rectal cancer Neg Hx    Past Surgical History:  Procedure Laterality Date   CESAREAN SECTION  1986   COLONOSCOPY  last one 05-25-2018   LAPAROSCOPIC UNILATERAL SALPINGO OOPHERECTOMY Bilateral 09/01/2019   Procedure: LAPAROSCOPIC BILATERAL SALPINGO OOPHORECTOMY;  Surgeon: Edwinna Areola, DO;  Location: [facility];  Service: Gynecology;  Laterality: Bilateral;   THYROIDECTOMY Bilateral 08-04-1999  dr Lurene Shadow  @[facility]    subtotal   UPPER GASTROINTESTINAL ENDOSCOPY     Social History   Social History Narrative   Social Hx:   Current  living situation- lives in [place] alone   Born in [place] and raised in [place] by mom and dad   Siblings 3 living siblings but total 5 (pt is number the youngest)   Schooling- HS grad   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   Married- divorced 1 yr ago but was separated for several years   Kids- 3   Legal issues- denies   Immunization History  Administered Date(s) Administered   Hep B, Unspecified 06/21/2018, 07/21/2018   Hepatitis B, ADULT 06/20/2018   Hepb-cpg 06/21/2018, 07/21/2018   Influenza Inj Mdck Quad Pf 02/22/2018, 02/25/2018   Influenza Split 03/10/2012, 01/04/2013, 03/25/2014, 02/09/2020   Influenza Whole 04/22/2007, 03/08/2009  Influenza, Seasonal, Injecte, Preservative Fre 04/02/2021   Influenza,inj,Quad PF,6+ Mos 03/16/2018   PFIZER(Purple Top)SARS-COV-2 Vaccination 07/21/2019, 08/11/2019, 04/15/2020   Polio, Unspecified 11/22/1958, 01/17/1959, 08/22/1959, 10/21/1967   Smallpox 01/31/1959   Td 06/27/2008   Td (Adult),unspecified 10/21/1967   Tdap 06/27/2008, 11/12/2020   Zoster Recombinant(Shingrix) 11/12/2020, 02/07/2021     Objective: Vital Signs: BP 117/77 (BP Location: Left Arm, Patient Position: Sitting, Cuff Size: Normal)   Pulse 76   Resp 17   Ht 5\' 5"  (1.651 m)   Wt 151 lb 6.4 oz (68.7 kg)   BMI 25.19 kg/m    Physical Exam Vitals and nursing note reviewed.  Constitutional:      Appearance: She is well-developed.  HENT:     Head: Normocephalic and atraumatic.  Eyes:     Conjunctiva/sclera: Conjunctivae normal.  Cardiovascular:     Rate and Rhythm: Normal rate and regular rhythm.     Heart sounds: Normal heart sounds.  Pulmonary:     Effort: Pulmonary effort is normal.     Breath sounds: Normal breath sounds.  Abdominal:     General: Bowel sounds are normal.     Palpations: Abdomen is soft.  Musculoskeletal:     Cervical back: Normal range of motion.  Lymphadenopathy:     Cervical: No cervical adenopathy.  Skin:     General: Skin is warm and dry.     Capillary Refill: Capillary refill takes less than 2 seconds.  Neurological:     Mental Status: She is alert and oriented to person, place, and time.  Psychiatric:        Behavior: Behavior normal.      Musculoskeletal Exam: Cervical spine was in good range of motion.  She had no tenderness over thoracic or lumbar spine.  Shoulder joints were in full range of motion with discomfort with abduction and internal rotation of the left shoulder.  She had tenderness over the subacromial region.  Elbow joints and wrist joints were in good range of motion.  She had PIP and DIP thickening with no synovitis.  Hip joints and knee joints in good range of motion without any warmth swelling or effusion.  There was no tenderness over ankles or MTPs.  CDAI Exam: CDAI Score: -- Patient Global: --; Provider Global: -- Swollen: --; Tender: -- Joint Exam 11/30/2022   No joint exam has been documented for this visit   There is currently no information documented on the homunculus. Go to the Rheumatology activity and complete the homunculus joint exam.  Investigation: No additional findings.  Imaging: No results found.  Recent Labs: Lab Results  Component Value Date   WBC 8.5 02/20/2022   HGB 14.4 02/20/2022   PLT 257 02/20/2022   NA 142 07/13/2022   K 4.3 07/13/2022   CL 104 07/13/2022   CO2 26 07/13/2022   GLUCOSE 111 (H) 07/13/2022   BUN 10 07/13/2022   CREATININE 0.70 07/13/2022   BILITOT 0.3 10/12/2022   ALKPHOS 150 (H) 10/12/2022   AST 31 10/12/2022   ALT 56 (H) 10/12/2022   PROT 6.7 10/12/2022   ALBUMIN 4.1 10/12/2022   CALCIUM 9.3 07/13/2022   GFRAA 94 06/07/2020    Speciality Comments: No specialty comments available.  Procedures:  No procedures performed Allergies: Gabapentin, Metformin and related, Sulfamethoxazole, and Other   Assessment / Plan:     Visit Diagnoses: Subacromial bursitis of left shoulder joint-patient had painful range  of motion of her left shoulder joint with discomfort over the subacromial  region.  She has been also experiencing discomfort in night when she sleeps on her left shoulder.  Patient was interested in getting a cortisone injection.  Patient is diabetic.  She states she has not taken her medications this morning.  Her blood sugar has been running between 150-200.  We decided not to give cortisone injection due to elevated blood glucose levels.  I will refer her to physical therapy.  Use of topical diclofenac gel was discussed.  Discussed due to elevated  Primary osteoarthritis of both hands -she complains of pain and stiffness in her bilateral hands.  No synovitis was noted.  PIP and DIP thickening was noted.  X-rays in the past showed early osteoarthritic changes.  Autoimmune workup was negative.  A handout on hand exercises was given.  Joint protection muscle strengthening was discussed.  Primary osteoarthritis of both feet -she is followed by Dr. Thora Lance.  She recently had nails clipped and treatment for onychomycosis was also discussed.  X-rays in the past showed early osteoarthritic changes in calcaneal spurs.  Fibromyalgia - History of generalized pain, hyperalgesia and positive tender points for many years.  Intolerance to pregabalin in the past.  She continues to have some generalized pain and discomfort.  Need for stretching exercises since, water aerobics and summing were discussed.  Family history of rheumatoid arthritis-maternal aunts  Insomnia due to other mental disorder  Type 2 diabetes mellitus without complication, without long-term current use of insulin (HCC)  History of gastroesophageal reflux (GERD)  Dyslipidemia  Hx of colonic polyps  History of hypothyroidism  History of depression  Tobacco use - 1/4 PPD X25 years  Orders: Orders Placed This Encounter  Procedures   Ambulatory referral to Physical Therapy   No orders of the defined types were placed in this  encounter.    Follow-Up Instructions: Return in about 6 months (around 06/02/2023) for Osteoarthritis.   Pollyann Savoy, MD  Note - This record has been created using Animal nutritionist.  Chart creation errors have been sought, but may not always  have been located. Such creation errors do not reflect on  the standard of medical care.

## 2022-11-18 ENCOUNTER — Ambulatory Visit: Payer: Medicaid Other | Attending: Nurse Practitioner

## 2022-11-18 DIAGNOSIS — R748 Abnormal levels of other serum enzymes: Secondary | ICD-10-CM

## 2022-11-19 ENCOUNTER — Other Ambulatory Visit: Payer: Self-pay | Admitting: Podiatry

## 2022-11-19 ENCOUNTER — Other Ambulatory Visit: Payer: Self-pay | Admitting: Nurse Practitioner

## 2022-11-19 LAB — ACUTE HEP PANEL AND HEP B SURFACE AB
Hep A IgM: NEGATIVE
Hep B C IgM: NEGATIVE
Hep C Virus Ab: NONREACTIVE
Hepatitis B Surf Ab Quant: 22.8 m[IU]/mL
Hepatitis B Surface Ag: NEGATIVE

## 2022-11-19 MED ORDER — AMMONIUM LACTATE 12 % EX LOTN: 1.0000 | TOPICAL_LOTION | CUTANEOUS | 0 refills | Status: DC | PRN

## 2022-11-19 MED ORDER — LIDOCAINE 5 % EX CREA: TOPICAL_CREAM | CUTANEOUS | 1 refills | Status: DC

## 2022-11-19 MED ORDER — DOCUSATE SODIUM 100 MG PO CAPS: 100.0000 mg | ORAL_CAPSULE | Freq: Two times a day (BID) | ORAL | 1 refills | Status: AC | PRN

## 2022-11-19 MED ORDER — ATORVASTATIN CALCIUM 40 MG PO TABS: 40.0000 mg | ORAL_TABLET | Freq: Every day | ORAL | 3 refills | Status: DC

## 2022-11-30 ENCOUNTER — Encounter: Payer: Self-pay | Admitting: Rheumatology

## 2022-11-30 ENCOUNTER — Ambulatory Visit: Payer: Medicaid Other | Attending: Rheumatology | Admitting: Rheumatology

## 2022-11-30 VITALS — BP 117/77 | HR 76 | Resp 17 | Ht 65.0 in | Wt 151.4 lb

## 2022-11-30 DIAGNOSIS — E785 Hyperlipidemia, unspecified: Secondary | ICD-10-CM

## 2022-11-30 DIAGNOSIS — F99 Mental disorder, not otherwise specified: Secondary | ICD-10-CM

## 2022-11-30 DIAGNOSIS — M19071 Primary osteoarthritis, right ankle and foot: Secondary | ICD-10-CM | POA: Diagnosis not present

## 2022-11-30 DIAGNOSIS — E119 Type 2 diabetes mellitus without complications: Secondary | ICD-10-CM

## 2022-11-30 DIAGNOSIS — Z86010 Personal history of colonic polyps: Secondary | ICD-10-CM

## 2022-11-30 DIAGNOSIS — M797 Fibromyalgia: Secondary | ICD-10-CM | POA: Diagnosis not present

## 2022-11-30 DIAGNOSIS — Z8719 Personal history of other diseases of the digestive system: Secondary | ICD-10-CM

## 2022-11-30 DIAGNOSIS — F5105 Insomnia due to other mental disorder: Secondary | ICD-10-CM

## 2022-11-30 DIAGNOSIS — Z8659 Personal history of other mental and behavioral disorders: Secondary | ICD-10-CM

## 2022-11-30 DIAGNOSIS — Z720 Tobacco use: Secondary | ICD-10-CM

## 2022-11-30 DIAGNOSIS — M19042 Primary osteoarthritis, left hand: Secondary | ICD-10-CM

## 2022-11-30 DIAGNOSIS — M19041 Primary osteoarthritis, right hand: Secondary | ICD-10-CM

## 2022-11-30 DIAGNOSIS — M19072 Primary osteoarthritis, left ankle and foot: Secondary | ICD-10-CM

## 2022-11-30 DIAGNOSIS — Z8639 Personal history of other endocrine, nutritional and metabolic disease: Secondary | ICD-10-CM

## 2022-11-30 DIAGNOSIS — M7552 Bursitis of left shoulder: Secondary | ICD-10-CM | POA: Diagnosis not present

## 2022-11-30 DIAGNOSIS — Z8261 Family history of arthritis: Secondary | ICD-10-CM

## 2022-11-30 NOTE — Patient Instructions (Addendum)
Hand Exercises Hand exercises can be helpful for almost anyone. They can strengthen your hands and improve flexibility and movement. The exercises can also increase blood flow to the hands. These results can make your work and daily tasks easier for you. Hand exercises can be especially helpful for people who have joint pain from arthritis or nerve damage from using their hands over and over. These exercises can also help people who injure a hand. Exercises Most of these hand exercises are gentle stretching and motion exercises. It is usually safe to do them often throughout the day. Warming up your hands before exercise may help reduce stiffness. You can do this with gentle massage or by placing your hands in warm water for 10-15 minutes. It is normal to feel some stretching, pulling, tightness, or mild discomfort when you begin new exercises. In time, this will improve. Remember to always be careful and stop right away if you feel sudden, very bad pain or your pain gets worse. You want to get better and be safe. Ask your health care provider which exercises are safe for you. Do exercises exactly as told by your provider and adjust them as told. Do not begin these exercises until told by your provider. Knuckle bend or "claw" fist  Stand or sit with your arm, hand, and all five fingers pointed straight up. Make sure to keep your wrist straight. Gently bend your fingers down toward your palm until the tips of your fingers are touching your palm. Keep your big knuckle straight and only bend the small knuckles in your fingers. Hold this position for 10 seconds. Straighten your fingers back to your starting position. Repeat this exercise 5-10 times with each hand. Full finger fist  Stand or sit with your arm, hand, and all five fingers pointed straight up. Make sure to keep your wrist straight. Gently bend your fingers into your palm until the tips of your fingers are touching the middle of your  palm. Hold this position for 10 seconds. Extend your fingers back to your starting position, stretching every joint fully. Repeat this exercise 5-10 times with each hand. Straight fist  Stand or sit with your arm, hand, and all five fingers pointed straight up. Make sure to keep your wrist straight. Gently bend your fingers at the big knuckle, where your fingers meet your hand, and at the middle knuckle. Keep the knuckle at the tips of your fingers straight and try to touch the bottom of your palm. Hold this position for 10 seconds. Extend your fingers back to your starting position, stretching every joint fully. Repeat this exercise 5-10 times with each hand. Tabletop  Stand or sit with your arm, hand, and all five fingers pointed straight up. Make sure to keep your wrist straight. Gently bend your fingers at the big knuckle, where your fingers meet your hand, as far down as you can. Keep the small knuckles in your fingers straight. Think of forming a tabletop with your fingers. Hold this position for 10 seconds. Extend your fingers back to your starting position, stretching every joint fully. Repeat this exercise 5-10 times with each hand. Finger spread  Place your hand flat on a table with your palm facing down. Make sure your wrist stays straight. Spread your fingers and thumb apart from each other as far as you can until you feel a gentle stretch. Hold this position for 10 seconds. Bring your fingers and thumb tight together again. Hold this position for 10 seconds. Repeat  this exercise 5-10 times with each hand. Making circles  Stand or sit with your arm, hand, and all five fingers pointed straight up. Make sure to keep your wrist straight. Make a circle by touching the tip of your thumb to the tip of your index finger. Hold for 10 seconds. Then open your hand wide. Repeat this motion with your thumb and each of your fingers. Repeat this exercise 5-10 times with each hand. Thumb  motion  Sit with your forearm resting on a table and your wrist straight. Your thumb should be facing up toward the ceiling. Keep your fingers relaxed as you move your thumb. Lift your thumb up as high as you can toward the ceiling. Hold for 10 seconds. Bend your thumb across your palm as far as you can, reaching the tip of your thumb for the small finger (pinkie) side of your palm. Hold for 10 seconds. Repeat this exercise 5-10 times with each hand. Grip strengthening  Hold a stress ball or other soft ball in the middle of your hand. Slowly increase the pressure, squeezing the ball as much as you can without causing pain. Think of bringing the tips of your fingers into the middle of your palm. All of your finger joints should bend when doing this exercise. Hold your squeeze for 10 seconds, then relax. Repeat this exercise 5-10 times with each hand. Contact a health care provider if: Your hand pain or discomfort gets much worse when you do an exercise. Your hand pain or discomfort does not improve within 2 hours after you exercise. If you have either of these problems, stop doing these exercises right away. Do not do them again unless your provider says that you can. Get help right away if: You develop sudden, severe hand pain or swelling. If this happens, stop doing these exercises right away. Do not do them again unless your provider says that you can. This information is not intended to replace advice given to you by your health care provider. Make sure you discuss any questions you have with your health care provider. Document Revised: 05/12/2022 Document Reviewed: 05/12/2022 Else[employer] Patient Education  2024 [employer]  Shoulder Exercises Ask your health care provider which exercises are safe for you. Do exercises exactly as told by your health care provider and adjust them as directed. It is normal to feel mild stretching, pulling, tightness, or discomfort as you do these exercises.  Stop right away if you feel sudden pain or your pain gets worse. Do not begin these exercises until told by your health care provider. Stretching exercises External rotation and abduction This exercise is sometimes called corner stretch. The exercise rotates your arm outward (external rotation) and moves your arm out from your body (abduction). Stand in a doorway with one of your feet slightly in front of the other. This is called a staggered stance. If you cannot reach your forearms to the door frame, stand facing a corner of a room. Choose one of the following positions as told by your health care provider: Place your hands and forearms on the door frame above your head. Place your hands and forearms on the door frame at the height of your head. Place your hands on the door frame at the height of your elbows. Slowly move your weight onto your front foot until you feel a stretch across your chest and in the front of your shoulders. Keep your head and chest upright and keep your abdominal muscles tight. Hold for  __________ seconds. To release the stretch, shift your weight to your back foot. Repeat __________ times. Complete this exercise __________ times a day. Extension, standing  Stand and hold a broomstick, a cane, or a similar object behind your back. Your hands should be a little wider than shoulder-width apart. Your palms should face away from your back. Keeping your elbows straight and your shoulder muscles relaxed, move the stick away from your body until you feel a stretch in your shoulders (extension). Avoid shrugging your shoulders while you move the stick. Keep your shoulder blades tucked down toward the middle of your back. Hold for __________ seconds. Slowly return to the starting position. Repeat __________ times. Complete this exercise __________ times a day. Range-of-motion exercises Pendulum  Stand near a wall or a surface that you can hold onto for balance. Bend at the  waist and let your left / right arm hang straight down. Use your other arm to support you. Keep your back straight and do not lock your knees. Relax your left / right arm and shoulder muscles, and move your hips and your trunk so your left / right arm swings freely. Your arm should swing because of the motion of your body, not because you are using your arm or shoulder muscles. Keep moving your hips and trunk so your arm swings in the following directions, as told by your health care provider: Side to side. Forward and backward. In clockwise and counterclockwise circles. Continue each motion for __________ seconds, or for as long as told by your health care provider. Slowly return to the starting position. Repeat __________ times. Complete this exercise __________ times a day. Shoulder flexion, standing  Stand and hold a broomstick, a cane, or a similar object. Place your hands a little more than shoulder-width apart on the object. Your left / right hand should be palm-up, and your other hand should be palm-down. Keep your elbow straight and your shoulder muscles relaxed. Push the stick up with your healthy arm to raise your left / right arm in front of your body, and then over your head until you feel a stretch in your shoulder (flexion). Avoid shrugging your shoulder while you raise your arm. Keep your shoulder blade tucked down toward the middle of your back. Hold for __________ seconds. Slowly return to the starting position. Repeat __________ times. Complete this exercise __________ times a day. Shoulder abduction, standing  Stand and hold a broomstick, a cane, or a similar object. Place your hands a little more than shoulder-width apart on the object. Your left / right hand should be palm-up, and your other hand should be palm-down. Keep your elbow straight and your shoulder muscles relaxed. Push the object across your body toward your left / right side. Raise your left / right arm to the  side of your body (abduction) until you feel a stretch in your shoulder. Do not raise your arm above shoulder height unless your health care provider tells you to do that. If directed, raise your arm over your head. Avoid shrugging your shoulder while you raise your arm. Keep your shoulder blade tucked down toward the middle of your back. Hold for __________ seconds. Slowly return to the starting position. Repeat __________ times. Complete this exercise __________ times a day. Internal rotation  Place your left / right hand behind your back, palm-up. Use your other hand to dangle an exercise band, a broomstick, or a similar object over your shoulder. Grasp the band with your left /  right hand so you are holding on to both ends. Gently pull up on the band until you feel a stretch in the front of your left / right shoulder. The movement of your arm toward the center of your body is called internal rotation. Avoid shrugging your shoulder while you raise your arm. Keep your shoulder blade tucked down toward the middle of your back. Hold for __________ seconds. Release the stretch by letting go of the band and lowering your hands. Repeat __________ times. Complete this exercise __________ times a day. Strengthening exercises External rotation  Sit in a stable chair without armrests. Secure an exercise band to a stable object at elbow height on your left / right side. Place a soft object, such as a folded towel or a small pillow, between your left / right upper arm and your body to move your elbow about 4 inches (10 cm) away from your side. Hold the end of the exercise band so it is tight and there is no slack. Keeping your elbow pressed against the soft object, slowly move your forearm out, away from your abdomen (external rotation). Keep your body steady so only your forearm moves. Hold for __________ seconds. Slowly return to the starting position. Repeat __________ times. Complete this  exercise __________ times a day. Shoulder abduction  Sit in a stable chair without armrests, or stand up. Hold a __________ lb / kg weight in your left / right hand, or hold an exercise band with both hands. Start with your arms straight down and your left / right palm facing in, toward your body. Slowly lift your left / right hand out to your side (abduction). Do not lift your hand above shoulder height unless your health care provider tells you that this is safe. Keep your arms straight. Avoid shrugging your shoulder while you do this movement. Keep your shoulder blade tucked down toward the middle of your back. Hold for __________ seconds. Slowly lower your arm, and return to the starting position. Repeat __________ times. Complete this exercise __________ times a day. Shoulder extension  Sit in a stable chair without armrests, or stand up. Secure an exercise band to a stable object in front of you so it is at shoulder height. Hold one end of the exercise band in each hand. Straighten your elbows and lift your hands up to shoulder height. Squeeze your shoulder blades together as you pull your hands down to the sides of your thighs (extension). Stop when your hands are straight down by your sides. Do not let your hands go behind your body. Hold for __________ seconds. Slowly return to the starting position. Repeat __________ times. Complete this exercise __________ times a day. Shoulder row  Sit in a stable chair without armrests, or stand up. Secure an exercise band to a stable object in front of you so it is at chest height. Hold one end of the exercise band in each hand. Position your palms so that your thumbs are facing the ceiling (neutral position). Bend each of your elbows to a 90-degree angle (right angle) and keep your upper arms at your sides. Step back or move the chair back until the band is tight and there is no slack. Slowly pull your elbows back behind you. Hold for  __________ seconds. Slowly return to the starting position. Repeat __________ times. Complete this exercise __________ times a day. Shoulder press-ups  Sit in a stable chair that has armrests. Sit upright, with your feet flat on the  floor. Put your hands on the armrests so your elbows are bent and your fingers are pointing forward. Your hands should be about even with the sides of your body. Push down on the armrests and use your arms to lift yourself off the chair. Straighten your elbows and lift yourself up as much as you comfortably can. Move your shoulder blades down, and avoid letting your shoulders move up toward your ears. Keep your feet on the ground. As you get stronger, your feet should support less of your body weight as you lift yourself up. Hold for __________ seconds. Slowly lower yourself back into the chair. Repeat __________ times. Complete this exercise __________ times a day. Wall push-ups  Stand so you are facing a stable wall. Your feet should be about one arm-length away from the wall. Lean forward and place your palms on the wall at shoulder height. Keep your feet flat on the floor as you bend your elbows and lean forward toward the wall. Hold for __________ seconds. Straighten your elbows to push yourself back to the starting position. Repeat __________ times. Complete this exercise __________ times a day. This information is not intended to replace advice given to you by your health care provider. Make sure you discuss any questions you have with your health care provider. Document Revised: 06/17/2021 Document Reviewed: 06/17/2021 Elsevier Patient Education  2024 [employer].

## 2022-12-11 ENCOUNTER — Ambulatory Visit: Payer: Medicare Other | Attending: Rheumatology | Admitting: Physical Therapy

## 2022-12-29 ENCOUNTER — Ambulatory Visit: Payer: Self-pay

## 2022-12-29 NOTE — Telephone Encounter (Signed)
Summary: Sore in right nostril   The patient called in stating she woke up with a sore in her right nostril this morning. She reports no other symptoms. Please assist patient further as she called in inquiring if she needed a prescription or something.          Chief Complaint: Raw are inside right nostril. No bleeding, no injury. No recent cold symptoms. Symptoms: Above, burning and itchy Frequency: Yesterday Pertinent Negatives: Patient denies any other symptoms Disposition: [] ED /[] Urgent Care (no appt availability in office) / [] Appointment(In office/virtual)/ []  [facility]/ [x] Home Care/ [] Refused Recommended Disposition /[] [facility]/ []  Follow-up with PCP Additional Notes: Pt. Will try Vaseline or Aquafor  to nostril. Will call back if no better in 2 days. Reason for Disposition  Minor cut or scratch  Answer Assessment - Initial Assessment Questions 1. APPEARANCE of INJURY: "What does the injury look like?"      Looks raw 2. SIZE: "How large is the cut?"      Small 3. BLEEDING: "Is it bleeding now?" If Yes, ask: "Is it difficult to stop?"      No 4. LOCATION: "Where is the injury located?"      Right nostril 5. ONSET: "How long ago did the injury occur?"      Yesterday 6. MECHANISM: "Tell me how it happened."      Unsure 7. TETANUS: "When was the last tetanus booster?"     Unsure 8. PREGNANCY: "Is there any chance you are pregnant?" "When was your last menstrual period?"     No  Protocols used: Skin Injury-A-AH

## 2023-01-12 ENCOUNTER — Ambulatory Visit: Payer: Medicare Other | Attending: Nurse Practitioner | Admitting: Nurse Practitioner

## 2023-01-12 ENCOUNTER — Encounter: Payer: Self-pay | Admitting: Nurse Practitioner

## 2023-01-12 VITALS — BP 127/81 | HR 80 | Ht 65.0 in | Wt 152.6 lb

## 2023-01-12 DIAGNOSIS — E1165 Type 2 diabetes mellitus with hyperglycemia: Secondary | ICD-10-CM

## 2023-01-12 DIAGNOSIS — L209 Atopic dermatitis, unspecified: Secondary | ICD-10-CM

## 2023-01-12 DIAGNOSIS — M2550 Pain in unspecified joint: Secondary | ICD-10-CM

## 2023-01-12 DIAGNOSIS — R42 Dizziness and giddiness: Secondary | ICD-10-CM

## 2023-01-12 DIAGNOSIS — Z780 Asymptomatic menopausal state: Secondary | ICD-10-CM

## 2023-01-12 DIAGNOSIS — R7989 Other specified abnormal findings of blood chemistry: Secondary | ICD-10-CM

## 2023-01-12 DIAGNOSIS — Z7984 Long term (current) use of oral hypoglycemic drugs: Secondary | ICD-10-CM

## 2023-01-12 MED ORDER — TRIAMCINOLONE ACETONIDE 0.025 % EX OINT: 1.0000 | TOPICAL_OINTMENT | Freq: Two times a day (BID) | CUTANEOUS | 1 refills | Status: DC

## 2023-01-12 MED ORDER — LIDOCAINE 5 % EX CREA
TOPICAL_CREAM | CUTANEOUS | 1 refills | Status: AC
Start: 2023-01-12 — End: ?

## 2023-01-12 NOTE — Progress Notes (Signed)
Assessment & Plan:  Melissa Harmon was seen today for dizziness.  Diagnoses and all orders for this visit:  Type 2 diabetes mellitus with hyperglycemia, without long-term current use of insulin (HCC) -     Hemoglobin A1c -     CMP14+EGFR  Atopic dermatitis, unspecified type -     triamcinolone (KENALOG) 0.025 % ointment; Apply 1 Application topically 2 (two) times daily.  Dizziness after extension of neck -     US Carotid Bilateral; Future -     Cancel: VAS US CAROTID; Future  Asymptomatic postmenopausal estrogen deficiency -     DG Bone Density; Future  Arthralgia of multiple joints -     Lidocaine 5 % CREA; Apply topically as prescribed  Abnormal CBC -     CBC with Differential    Patient has been counseled on age-appropriate routine health concerns for screening and prevention. These are reviewed and up-to-date. Referrals have been placed accordingly. Immunizations are up-to-date or declined.    Subjective:   Chief Complaint  Patient presents with   Dizziness   HPI Melissa Harmon 67 y.o. female presents to office today with complaints of dizziness.  Vertigo - Dizziness: Patient presents with dizziness .  The dizziness has been present for a few weeks. The patient describes the symptoms as disequalibirum and lightheadedness. Symptoms are exacerbated by rapid head movements, looking  upward, downward, rising from supine position, rising from squatting or sitting position, and bending. Patient denies aural pressure, otalgia, otorrhea, tinnitus hearing loss.  She has been treated with nothing as of today. Previous work up has been NONE.    She also states her hair started falling out and breaking off a few weeks ago. She also reports chemically processing her hair 2 months ago.  Associated symptoms include itching and burning scalp.   Review of Systems  Constitutional:  Negative for fever, malaise/fatigue and weight loss.  HENT: Negative.  Negative for nosebleeds.   Eyes:  Negative.  Negative for blurred vision, double vision, photophobia, pain, discharge and redness.  Respiratory: Negative.  Negative for cough and shortness of breath.   Cardiovascular: Negative.  Negative for chest pain, palpitations and leg swelling.  Gastrointestinal: Negative.  Negative for heartburn, nausea and vomiting.  Musculoskeletal:  Positive for joint pain (chronic). Negative for myalgias.  Neurological:  Positive for dizziness. Negative for focal weakness, seizures and headaches.  Psychiatric/Behavioral: Negative.  Negative for suicidal ideas.     Past Medical History:  Diagnosis Date   Bronchitis    Carpal tunnel syndrome on both sides    Family history of breast cancer    Family history of colonic polyps    Family history of lung cancer    Family history of ovarian cancer    Family history of stomach cancer    Fibromyalgia    GERD (gastroesophageal reflux disease)    History of chronic bronchitis    History of Hashimoto thyroiditis    s/p  bilateral thryoidectomy 03/ 2001   Hyperlipidemia    Hypothyroidism, postsurgical followed by pcp   08-04-1999 s/p  bilateral thyroidectomy for multinodular goitar (per path hashimoto's thyroiditis)   Irritable bowel syndrome with constipation    MDD (major depressive disorder)    Pelvic pain    Right ovarian cyst    Type 2 diabetes mellitus (HCC)    followed by pcp  (08-24-2019 pt stated checks blood sugar dialy in am,  fasting-- 159-175)   Wears contact lenses    Wears dentures  upper    Past Surgical History:  Procedure Laterality Date   CESAREAN SECTION  1986   COLONOSCOPY  last one 05-25-2018   LAPAROSCOPIC UNILATERAL SALPINGO OOPHERECTOMY Bilateral 09/01/2019   Procedure: LAPAROSCOPIC BILATERAL SALPINGO OOPHORECTOMY;  Surgeon: Edwinna Areola, DO;  Location: [facility];  Service: Gynecology;  Laterality: Bilateral;   THYROIDECTOMY Bilateral 08-04-1999  dr Lurene Shadow  @[facility]    subtotal   UPPER  GASTROINTESTINAL ENDOSCOPY      Family History  Problem Relation Age of Onset   Diabetes Mother    Heart disease Mother    Stroke Mother    Colon cancer Father    Bipolar disorder Father    Colon polyps Father        unknown number   Ovarian cancer Sister 13       or other gynecologic cancer   Schizophrenia Brother    Heart disease Brother    Diverticulitis Maternal Aunt    Stomach cancer Maternal Grandmother        dx ?   Heart attack Maternal Grandmother    Lung cancer Cousin        dx 25s (maternal first cousin)   Breast cancer Cousin        dx 60s/70s (maternal first cousin)   Heart disease Half-Brother    Colon polyps Other 25       >80 polyps (6th degree relative - mother's cousin's grandson)   Esophageal cancer Neg Hx    Rectal cancer Neg Hx     Social History Reviewed with no changes to be made today.   Outpatient Medications Prior to Visit  Medication Sig Dispense Refill   Accu-Chek Softclix Lancets lancets Use to check blood sugar twice daily. E11.65 100 each 2   albuterol (VENTOLIN HFA) 108 (90 Base) MCG/ACT inhaler Inhale 2 puffs into the lungs every 6 (six) hours as needed for wheezing. 18 g 1   ammonium lactate (AMLACTIN) 12 % lotion Apply 1 Application topically as needed for dry skin. 400 g 0   atorvastatin (LIPITOR) 40 MG tablet Take 1 tablet (40 mg total) by mouth daily. 90 tablet 3   Blood Glucose Monitoring Suppl (ACCU-CHEK GUIDE) w/Device KIT Use to check blood sugar twice daily. E11.65 1 kit 0   docusate sodium (COLACE) 100 MG capsule Take 1 capsule (100 mg total) by mouth 2 (two) times daily as needed for mild constipation. 10 capsule 1   glipiZIDE (GLUCOTROL XL) 5 MG 24 hr tablet Take 1 tablet (5 mg total) by mouth daily with breakfast. (Patient taking differently: Take 2.5 mg by mouth daily with breakfast.) 90 tablet 1   glucose blood (ACCU-CHEK GUIDE) test strip Use to check blood sugar once daily. E11.65 100 each 2   glucose blood (ACCU-CHEK  GUIDE) test strip Use to check blood sugar twice daily. 100 each 6   levothyroxine (SYNTHROID) 175 MCG tablet Take 1 tablet (175 mcg total) by mouth daily. 90 tablet 1   LINZESS 145 MCG CAPS capsule Take 145 mcg by mouth every morning.     naproxen (NAPROSYN) 250 MG tablet Take 1 tablet (250 mg total) by mouth 2 (two) times daily with a meal. (Patient taking differently: Take 250 mg by mouth as needed.) 60 tablet 0   pantoprazole (PROTONIX) 40 MG tablet Take 1 tablet (40 mg total) by mouth daily. 90 tablet 1   Lidocaine 5 % CREA Apply topically as prescribed 30 g 1   triamcinolone (KENALOG) 0.025 % ointment  Apply 1 Application topically 2 (two) times daily. 60 g 1   Facility-Administered Medications Prior to Visit  Medication Dose Route Frequency Provider Last Rate Last Admin   0.9 %  sodium chloride infusion  500 mL Intravenous Once Nandigam, Eleonore Chiquito, MD        Allergies  Allergen Reactions   Gabapentin Other (See Comments)   Metformin And Related Other (See Comments)    GI symptoms; Constipation   Sulfamethoxazole Hives   Other Rash       Objective:    Ht 5\' 5"  (1.651 m)   Wt 152 lb 9.6 oz (69.2 kg)   SpO2 96%   BMI 25.39 kg/m  Wt Readings from Last 3 Encounters:  01/12/23 152 lb 9.6 oz (69.2 kg)  11/30/22 151 lb 6.4 oz (68.7 kg)  10/12/22 150 lb 9.6 oz (68.3 kg)    Physical Exam Constitutional:      Appearance: She is well-developed.  HENT:     Head: Normocephalic and atraumatic.     Right Ear: Hearing, tympanic membrane, ear canal and external ear normal.     Left Ear: Hearing, tympanic membrane, ear canal and external ear normal.     Nose: Nose normal.     Right Turbinates: Not enlarged.     Left Turbinates: Not enlarged.     Mouth/Throat:     Lips: Pink.     Mouth: Mucous membranes are moist.     Dentition: No dental tenderness, gingival swelling, dental abscesses or gum lesions.     Pharynx: No oropharyngeal exudate.  Eyes:     General: No scleral icterus.        Right eye: No discharge.     Extraocular Movements: Extraocular movements intact.     Conjunctiva/sclera: Conjunctivae normal.     Pupils: Pupils are equal, round, and reactive to light.  Neck:     Thyroid: No thyromegaly.     Trachea: No tracheal deviation.  Cardiovascular:     Rate and Rhythm: Normal rate and regular rhythm.     Heart sounds: Normal heart sounds. No murmur heard.    No friction rub.  Pulmonary:     Effort: Pulmonary effort is normal. No accessory muscle usage or respiratory distress.     Breath sounds: Normal breath sounds. No decreased breath sounds, wheezing, rhonchi or rales.  Abdominal:     General: Bowel sounds are normal. There is no distension.     Palpations: Abdomen is soft. There is no mass.     Tenderness: There is no abdominal tenderness. There is no right CVA tenderness, left CVA tenderness, guarding or rebound.     Hernia: No hernia is present.  Musculoskeletal:        General: No tenderness or deformity. Normal range of motion.     Cervical back: Normal range of motion and neck supple.  Lymphadenopathy:     Cervical: No cervical adenopathy.  Skin:    General: Skin is warm and dry.     Findings: No erythema.  Neurological:     Mental Status: She is alert and oriented to person, place, and time.     Cranial Nerves: No cranial nerve deficit.     Motor: Motor function is intact.     Coordination: Coordination is intact. Coordination normal.     Gait: Gait is intact.     Deep Tendon Reflexes:     Reflex Scores:      Patellar reflexes are 1+ on the right  side and 1+ on the left side. Psychiatric:        Attention and Perception: Attention normal.        Mood and Affect: Mood normal.        Speech: Speech normal.        Behavior: Behavior normal.        Thought Content: Thought content normal.        Judgment: Judgment normal.          Patient has been counseled extensively about nutrition and exercise as well as the importance of  adherence with medications and regular follow-up. The patient was given clear instructions to go to ER or return to medical center if symptoms don't improve, worsen or new problems develop. The patient verbalized understanding.   Follow-up: Return in about 6 months (around 07/12/2023).   Claiborne Rigg, FNP-BC [facility] [place], [place] [phone]   01/12/2023, 12:21 PM

## 2023-01-13 LAB — CMP14+EGFR
ALT: 50 IU/L — ABNORMAL HIGH (ref 0–32)
AST: 37 IU/L (ref 0–40)
Albumin: 4.2 g/dL (ref 3.9–4.9)
Alkaline Phosphatase: 136 IU/L — ABNORMAL HIGH (ref 44–121)
BUN/Creatinine Ratio: 19 (ref 12–28)
BUN: 14 mg/dL (ref 8–27)
Bilirubin Total: 0.4 mg/dL (ref 0.0–1.2)
CO2: 22 mmol/L (ref 20–29)
Calcium: 9.3 mg/dL (ref 8.7–10.3)
Chloride: 102 mmol/L (ref 96–106)
Creatinine, Ser: 0.72 mg/dL (ref 0.57–1.00)
Globulin, Total: 3 g/dL (ref 1.5–4.5)
Glucose: 128 mg/dL — ABNORMAL HIGH (ref 70–99)
Potassium: 4.3 mmol/L (ref 3.5–5.2)
Sodium: 140 mmol/L (ref 134–144)
Total Protein: 7.2 g/dL (ref 6.0–8.5)
eGFR: 93 mL/min/{1.73_m2} (ref >59)

## 2023-01-13 LAB — CBC WITH DIFFERENTIAL/PLATELET
Basophils Absolute: 0.1 10*3/uL (ref 0.0–0.2)
Basos: 1 %
EOS (ABSOLUTE): 0.1 10*3/uL (ref 0.0–0.4)
Eos: 1 %
Hematocrit: 44.4 % (ref 34.0–46.6)
Hemoglobin: 14.3 g/dL (ref 11.1–15.9)
Immature Grans (Abs): 0.1 10*3/uL (ref 0.0–0.1)
Immature Granulocytes: 1 %
Lymphocytes Absolute: 3.2 10*3/uL — ABNORMAL HIGH (ref 0.7–3.1)
Lymphs: 32 %
MCH: 28.8 pg (ref 26.6–33.0)
MCHC: 32.2 g/dL (ref 31.5–35.7)
MCV: 89 fL (ref 79–97)
Monocytes Absolute: 0.6 10*3/uL (ref 0.1–0.9)
Monocytes: 6 %
Neutrophils Absolute: 6.2 10*3/uL (ref 1.4–7.0)
Neutrophils: 59 %
Platelets: 315 10*3/uL (ref 150–450)
RBC: 4.97 x10E6/uL (ref 3.77–5.28)
RDW: 12.8 % (ref 11.7–15.4)
WBC: 10.2 10*3/uL (ref 3.4–10.8)

## 2023-01-13 LAB — HEMOGLOBIN A1C
Est. average glucose Bld gHb Est-mCnc: 171 mg/dL
Hgb A1c MFr Bld: 7.6 % — ABNORMAL HIGH (ref 4.8–5.6)

## 2023-01-15 ENCOUNTER — Encounter (HOSPITAL_COMMUNITY): Payer: Medicare Other

## 2023-01-17 ENCOUNTER — Other Ambulatory Visit: Payer: Self-pay | Admitting: Nurse Practitioner

## 2023-01-21 ENCOUNTER — Ambulatory Visit
Admission: RE | Admit: 2023-01-21 | Discharge: 2023-01-21 | Disposition: A | Discharge status: Home / Self Care | Payer: 59 | Source: Ambulatory Visit | Attending: Nurse Practitioner | Admitting: Nurse Practitioner

## 2023-01-21 DIAGNOSIS — R42 Dizziness and giddiness: Secondary | ICD-10-CM

## 2023-01-27 IMAGING — CR DG FOOT COMPLETE 3+V*L*
3 series · 3 of 3 positions shown · non-contrast
Comparison: None.

CLINICAL DATA: Pain in the third and fourth toes after hitting foot
on a bed 3 days ago.

EXAM:
LEFT FOOT - COMPLETE 3+ VIEW

[x foot ap left]
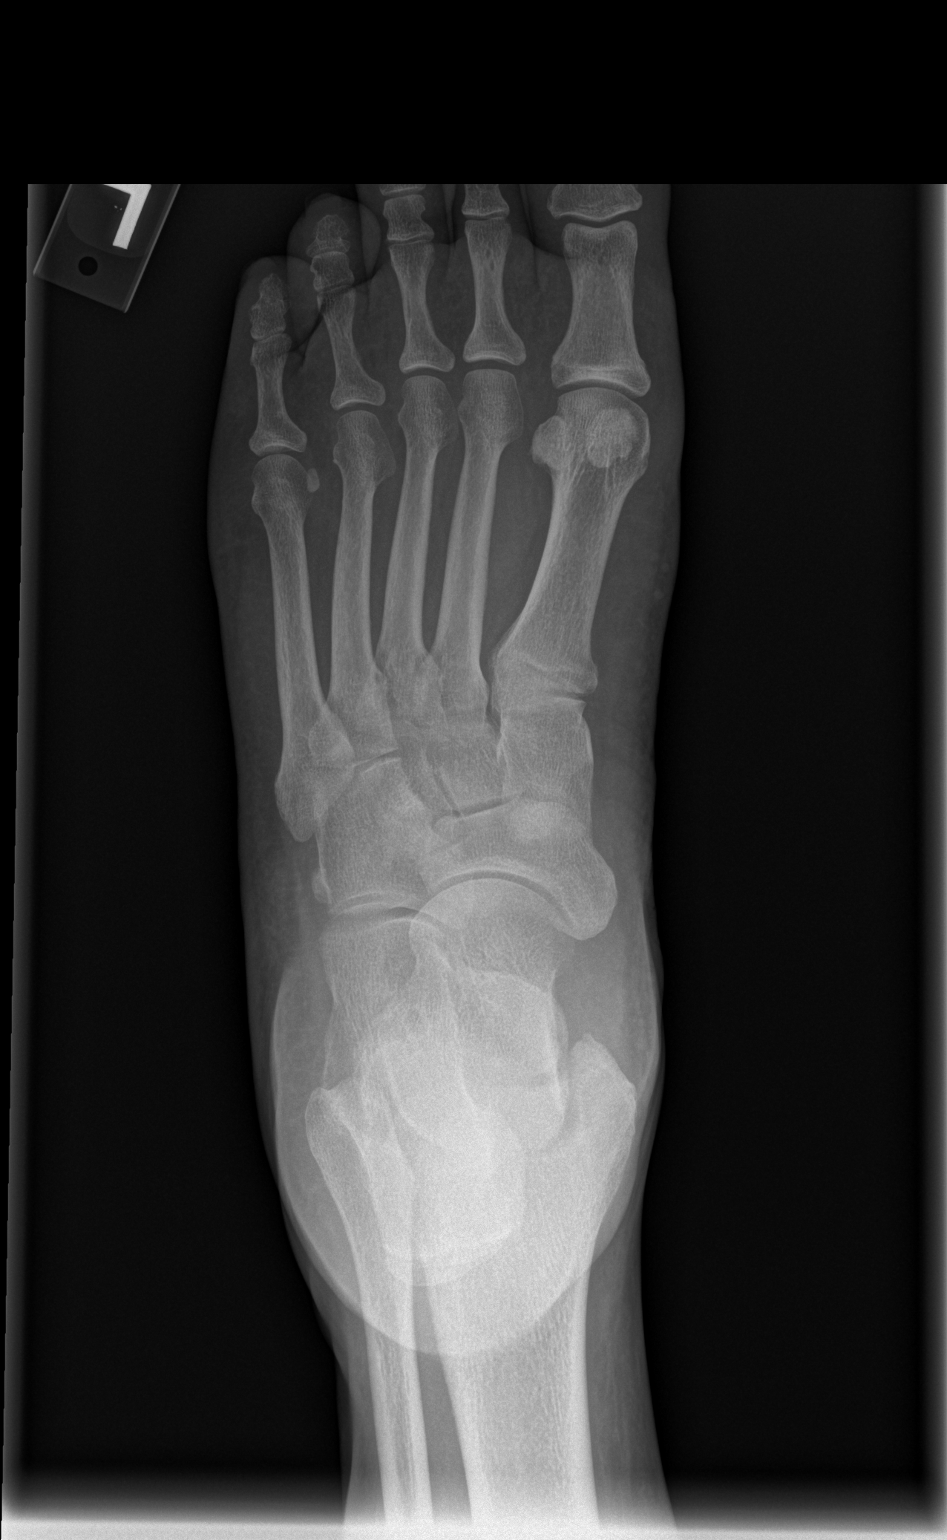

[x foot obl left]
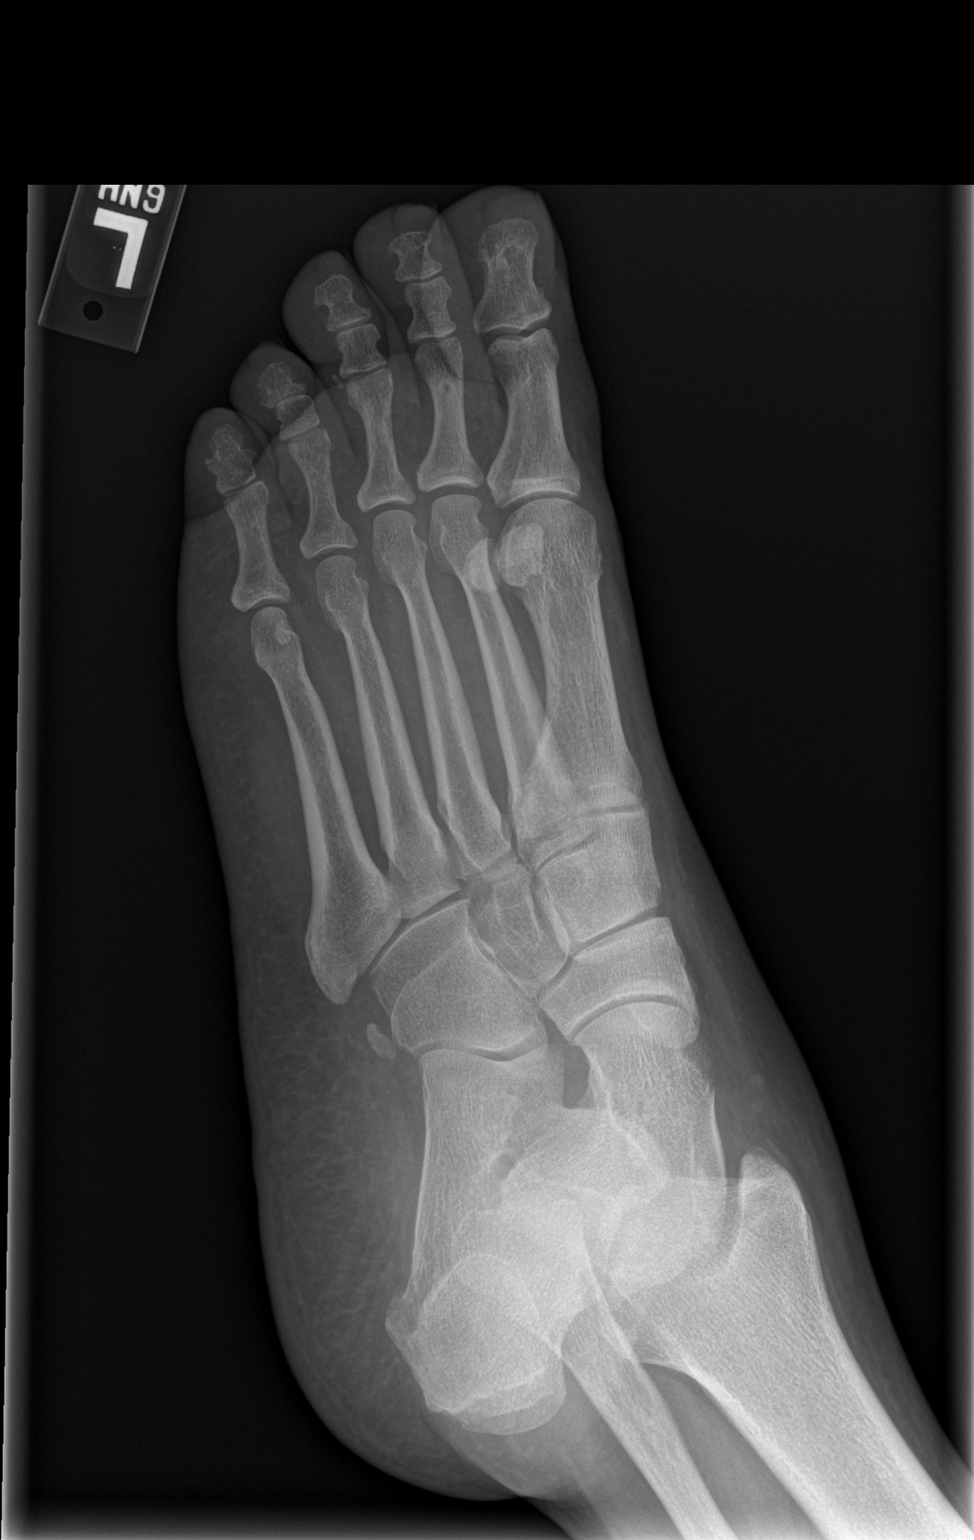

[x foot lat left]
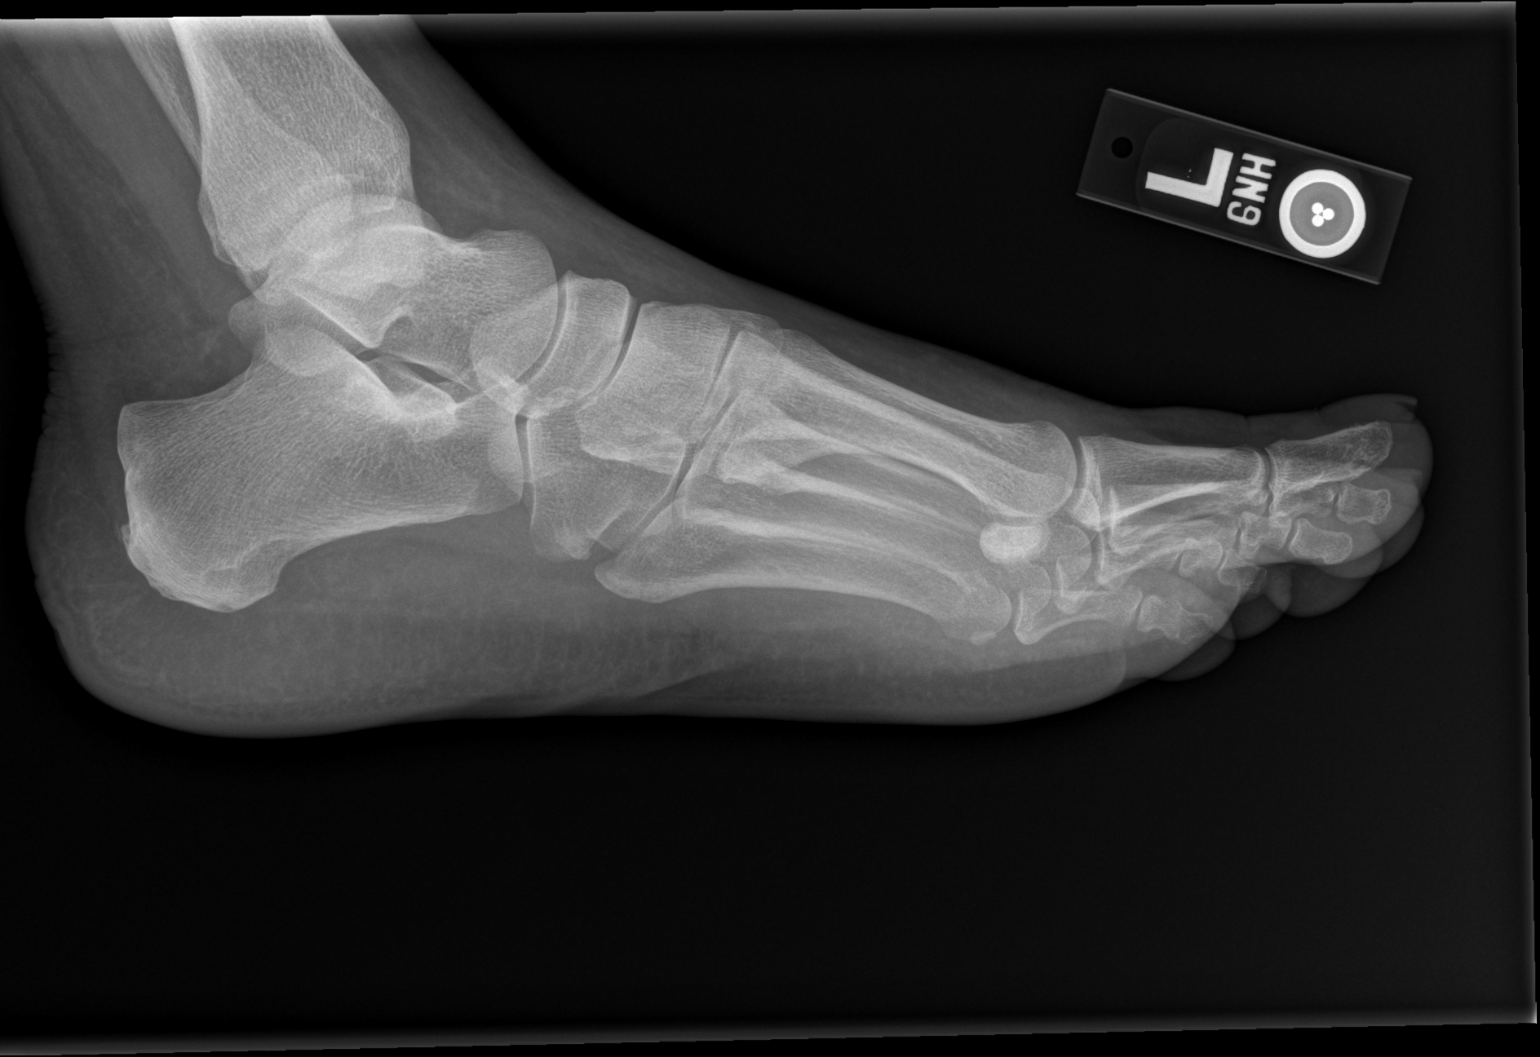

[3 of 3 positions shown; findings below may reference images not displayed]

FINDINGS: There is no evidence of fracture or dislocation. There is no
evidence of arthropathy or other focal bone abnormality. Soft
tissues are unremarkable.
IMPRESSION: Negative.

## 2023-01-30 ENCOUNTER — Other Ambulatory Visit: Payer: Self-pay | Admitting: Nurse Practitioner

## 2023-02-02 MED ORDER — ACCU-CHEK GUIDE W/DEVICE KIT: PACK | 0 refills | Status: DC

## 2023-02-10 ENCOUNTER — Other Ambulatory Visit (HOSPITAL_BASED_OUTPATIENT_CLINIC_OR_DEPARTMENT_OTHER): Payer: Self-pay

## 2023-02-11 ENCOUNTER — Ambulatory Visit
Admission: EM | Admit: 2023-02-11 | Discharge: 2023-02-11 | Disposition: A | Discharge status: Home / Self Care | Payer: 59

## 2023-02-11 DIAGNOSIS — M25521 Pain in right elbow: Secondary | ICD-10-CM

## 2023-02-11 MED ORDER — DOXYCYCLINE HYCLATE 100 MG PO CAPS
100.0000 mg | ORAL_CAPSULE | Freq: Two times a day (BID) | ORAL | 0 refills | Status: DC
Start: 2023-02-11 — End: 2023-04-16

## 2023-02-11 NOTE — ED Triage Notes (Signed)
"  My right elbow is hurting, I feel like something maybe have bitten it/that area". It has some swelling/redness "and throbs at night". "I did bump it recently so maybe it is that". First noticed "a week ago".

## 2023-02-11 NOTE — ED Provider Notes (Signed)
[facility]    CSN: [id number] Arrival date & time: 02/11/23  1120      History   Chief Complaint Chief Complaint  Patient presents with   Elbow Pain    Right    HPI Melissa Harmon is a 67 y.o. female.   Patient here today for evaluation of right elbow pain and mild swelling that started about a week ago.  She reports that she did bump her elbow but denies any significant pain after injury.  She reports that she might of been bitten by something she is unsure.  She states that pain is present to the lateral epicondyle.  The history is provided by the patient.    Past Medical History:  Diagnosis Date   Bronchitis    Carpal tunnel syndrome on both sides    Family history of breast cancer    Family history of colonic polyps    Family history of lung cancer    Family history of ovarian cancer    Family history of stomach cancer    Fibromyalgia    GERD (gastroesophageal reflux disease)    History of chronic bronchitis    History of Hashimoto thyroiditis    s/p  bilateral thryoidectomy 03/ 2001   Hyperlipidemia    Hypothyroidism, postsurgical followed by pcp   08-04-1999 s/p  bilateral thyroidectomy for multinodular goitar (per path hashimoto's thyroiditis)   Irritable bowel syndrome with constipation    MDD (major depressive disorder)    Pelvic pain    Right ovarian cyst    Type 2 diabetes mellitus (HCC)    followed by pcp  (08-24-2019 pt stated checks blood sugar dialy in am,  fasting-- 159-175)   Wears contact lenses    Wears dentures    upper    Patient Active Problem List   Diagnosis Date Noted   Pain due to onychomycosis of toenails of both feet 05/15/2022   Anxiety 11/26/2021   Atopic dermatitis 11/26/2021   Irritable bowel syndrome with constipation 11/26/2021   Type 2 diabetes mellitus with hyperglycemia (HCC) 11/26/2021   Unspecified condition associated with female genital organs and menstrual cycle 11/26/2021   Diabetes mellitus with  neuropathy (HCC) 06/07/2020   Diabetic neuropathy (HCC) 06/07/2020   Genetic testing 05/22/2020   Family history of colonic polyps    Family history of ovarian cancer    Family history of stomach cancer    Family history of lung cancer    Family history of breast cancer    Adjustment disorder with anxious mood 09/18/2017   Insomnia due to other mental disorder 09/18/2017   DYSURIA 10/25/2009   ABDOMINAL PAIN, RIGHT UPPER QUADRANT 10/25/2009   NECK PAIN, LEFT 08/23/2009   HAND PAIN, RIGHT 08/23/2009   ELBOW PAIN, BILATERAL 05/17/2009   Tobacco user 05/13/2009   DIABETES MELLITUS, TYPE II, WITHOUT COMPLICATIONS 12/14/2008   DYSLIPIDEMIA 12/14/2008   SHORTNESS OF BREATH 12/14/2008   RECTAL BLEEDING 04/22/2007   History of colonic polyps 04/22/2007   BLOOD IN STOOL 04/15/2007   PITYRIASIS ROSEA 08/09/2006   HYPOTHYROIDISM, UNSPECIFIED 07/08/2006   Esophageal reflux 07/08/2006   FIBROMYALGIA, FIBROMYOSITIS 07/08/2006    Past Surgical History:  Procedure Laterality Date   CESAREAN SECTION  1986   COLONOSCOPY  last one 05-25-2018   LAPAROSCOPIC UNILATERAL SALPINGO OOPHERECTOMY Bilateral 09/01/2019   Procedure: LAPAROSCOPIC BILATERAL SALPINGO OOPHORECTOMY;  Surgeon: Edwinna Areola, DO;  Location: [facility];  Service: Gynecology;  Laterality: Bilateral;   THYROIDECTOMY Bilateral 08-04-1999  dr Lurene Shadow  @[facility]    subtotal   UPPER GASTROINTESTINAL ENDOSCOPY      OB History   No obstetric history on file.      Home Medications    Prior to Admission medications   Medication Sig Start Date End Date Taking? Authorizing Provider  doxycycline (VIBRAMYCIN) 100 MG capsule Take 1 capsule (100 mg total) by mouth 2 (two) times daily. 02/11/23  Yes Tomi Bamberger, PA-C  Accu-Chek Softclix Lancets lancets Use to check blood sugar twice daily. E11.65 09/01/22   Hoy Register, MD  albuterol (PROAIR HFA) 108 (90 Base) MCG/ACT inhaler Inhale 2 puffs into the lungs every 4  (four) hours as needed for wheezing or shortness of breath. 08/29/18   [provider]  albuterol (VENTOLIN HFA) 108 (90 Base) MCG/ACT inhaler Inhale 2 puffs into the lungs every 6 (six) hours as needed for wheezing. 07/13/22   Claiborne Rigg, NP  ammonium lactate (AMLACTIN) 12 % lotion Apply 1 Application topically as needed for dry skin. 11/19/22   Helane Gunther, DPM  atorvastatin (LIPITOR) 40 MG tablet Take 1 tablet (40 mg total) by mouth daily. 11/19/22   Claiborne Rigg, NP  atorvastatin (LIPITOR) 40 MG tablet Take 40 mg by mouth daily.    [provider]  Blood Glucose Monitoring Suppl (ACCU-CHEK GUIDE) w/Device KIT Use to check blood sugar twice daily. E11.65 02/02/23   Claiborne Rigg, NP  cetirizine (ZYRTEC) 10 MG tablet Take 1 tablet by mouth daily. 01/31/23   [provider]  docusate sodium (COLACE) 100 MG capsule Take 1 capsule (100 mg total) by mouth 2 (two) times daily as needed for mild constipation. 11/19/22   Claiborne Rigg, NP  glipiZIDE (GLUCOTROL XL) 5 MG 24 hr tablet Take 1 tablet (5 mg total) by mouth daily with breakfast. Patient taking differently: Take 2.5 mg by mouth daily with breakfast. 06/09/22   Claiborne Rigg, NP  glucose blood (ACCU-CHEK GUIDE) test strip Use to check blood sugar once daily. E11.65 08/05/22   Hoy Register, MD  glucose blood (ACCU-CHEK GUIDE) test strip Use to check blood sugar twice daily. 09/01/22   Hoy Register, MD  glucose blood test strip 1 each by Other route as needed. 06/25/16   [provider]  Lancets MISC as directed :E11.40 - OneTouch Verio for 30    [provider]  levothyroxine (SYNTHROID) 137 MCG tablet Take 137 mcg by mouth daily before breakfast.    [provider]  levothyroxine (SYNTHROID) 150 MCG tablet Take 150 mcg by mouth daily before breakfast. 03/18/20   [provider]  levothyroxine (SYNTHROID) 175 MCG tablet Take 1 tablet (175 mcg total) by mouth daily.  08/26/22   Claiborne Rigg, NP  Lidocaine 5 % CREA Apply topically as prescribed 01/12/23   Claiborne Rigg, NP  linaclotide [facility]) 145 MCG CAPS capsule Take 145 mcg by mouth daily before breakfast. 05/11/18   [provider]  LINZESS 145 MCG CAPS capsule Take 145 mcg by mouth every morning. 11/24/22   [provider]  metFORMIN (GLUCOPHAGE-XR) 500 MG 24 hr tablet Take 500 mg by mouth as directed.    [provider]  naproxen (NAPROSYN) 250 MG tablet Take 1 tablet (250 mg total) by mouth 2 (two) times daily with a meal. Patient taking differently: Take 250 mg by mouth as needed. 10/05/22   Claiborne Rigg, NP  pantoprazole (PROTONIX) 40 MG tablet Take 1 tablet (40 mg total) by mouth  daily. 06/09/22   Claiborne Rigg, NP  pantoprazole (PROTONIX) 40 MG tablet Take 40 mg by mouth daily. 07/07/17   [provider]  Pediatric Multivit-Minerals-C (CHEWABLES MULTIVITAMIN PO) as directed Orally once a day for 30 days 07/02/15   [provider]  sitaGLIPtin (JANUVIA) 50 MG tablet Take 50 mg by mouth daily. 07/22/18   [provider]  triamcinolone (KENALOG) 0.025 % ointment Apply 1 Application topically 2 (two) times daily. 01/12/23   Claiborne Rigg, NP  omeprazole (PRILOSEC) 20 MG capsule Take 1 capsule (20 mg total) by mouth daily. 12/26/14 06/09/22  Blake Divine, MD    Family History Family History  Problem Relation Age of Onset   Diabetes Mother    Heart disease Mother    Stroke Mother    Colon cancer Father    Bipolar disorder Father    Colon polyps Father        unknown number   Ovarian cancer Sister 30       or other gynecologic cancer   Schizophrenia Brother    Heart disease Brother    Diverticulitis Maternal Aunt    Stomach cancer Maternal Grandmother        dx ?   Heart attack Maternal Grandmother    Lung cancer Cousin        dx 34s (maternal first cousin)   Breast cancer Cousin        dx 60s/70s (maternal first cousin)   Heart  disease Half-Brother    Colon polyps Other 25       >80 polyps (6th degree relative - mother's cousin's grandson)   Esophageal cancer Neg Hx    Rectal cancer Neg Hx     Social History Social History   Tobacco Use   Smoking status: Every Day    Current packs/day: 0.25    Average packs/day: 0.3 packs/day for 25.0 years (6.3 ttl pk-yrs)    Types: Cigarettes    Passive exposure: Current   Smokeless tobacco: Never   Tobacco comments:    6 cig per day  Vaping Use   Vaping status: Never Used  Substance Use Topics   Alcohol use: Yes    Alcohol/week: 6.0 standard drinks of alcohol    Types: 6 Cans of beer per week    Comment: Occassionally.   Drug use: Never     Allergies   Gabapentin, Metformin and related, Sulfa antibiotics, Sulfamethoxazole, and Other   Review of Systems Review of Systems  Constitutional:  Negative for chills and fever.  Eyes:  Negative for discharge and redness.  Respiratory:  Negative for shortness of breath.   Gastrointestinal:  Negative for nausea and vomiting.  Musculoskeletal:  Positive for arthralgias and joint swelling.  Skin:  Positive for color change. Negative for wound.  Neurological:  Negative for numbness.     Physical Exam Triage Vital Signs ED Triage Vitals  Encounter Vitals Group     BP 02/11/23 1157 120/72     Systolic BP Percentile --      Diastolic BP Percentile --      Pulse Rate 02/11/23 1157 76     Resp 02/11/23 1157 18     Temp 02/11/23 1157 98.7 F (37.1 C)     Temp Source 02/11/23 1157 Oral     SpO2 02/11/23 1157 96 %     Weight 02/11/23 1156 156 lb (70.8 kg)     Height 02/11/23 1156 5\' 5"  (1.651 m)     Head  Circumference --      Peak Flow --      Pain Score 02/11/23 1152 8     Pain Loc --      Pain Education --      Exclude from Growth Chart --    No data found.  Updated Vital Signs BP 120/72 (BP Location: Left Arm)   Pulse 76   Temp 98.7 F (37.1 C) (Oral)   Resp 18   Ht 5\' 5"  (1.651 m)   Wt 156 lb  (70.8 kg)   SpO2 96%   BMI 25.96 kg/m      Physical Exam Vitals and nursing note reviewed.  Constitutional:      General: She is not in acute distress.    Appearance: Normal appearance. She is not ill-appearing.  HENT:     Head: Normocephalic and atraumatic.  Eyes:     Conjunctiva/sclera: Conjunctivae normal.  Cardiovascular:     Rate and Rhythm: Normal rate.  Pulmonary:     Effort: Pulmonary effort is normal. No respiratory distress.  Musculoskeletal:     Comments: Full range of motion of right elbow.  Mild swelling noted to the lateral epicondyle area with tenderness to palpation of same.  Some erythema and warmth noted.  Neurological:     Mental Status: She is alert.  Psychiatric:        Mood and Affect: Mood normal.        Behavior: Behavior normal.        Thought Content: Thought content normal.      UC Treatments / Results  Labs (all labs ordered are listed, but only abnormal results are displayed) Labs Reviewed - No data to display  EKG   Radiology No results found.  Procedures Procedures (including critical care time)  Medications Ordered in UC Medications - No data to display  Initial Impression / Assessment and Plan / UC Course  I have reviewed the triage vital signs and the nursing notes.  Pertinent labs & imaging results that were available during my care of the patient were reviewed by me and considered in my medical decision making (see chart for details).    Patient already taking anti-inflammatory.  Recommend she continue same.  Will treat with doxycycline to cover possible bacterial cause of swelling given suspected insect bite.  Recommended follow-up if no gradual improvement with any further concerns.  Final Clinical Impressions(s) / UC Diagnoses   Final diagnoses:  Right elbow pain   Discharge Instructions   None    ED Prescriptions     Medication Sig Dispense Auth. Provider   doxycycline (VIBRAMYCIN) 100 MG capsule Take 1  capsule (100 mg total) by mouth 2 (two) times daily. 20 capsule Tomi Bamberger, PA-C      PDMP not reviewed this encounter.   Tomi Bamberger, PA-C 02/11/23 1624

## 2023-02-14 ENCOUNTER — Other Ambulatory Visit: Payer: Self-pay | Admitting: Nurse Practitioner

## 2023-02-15 ENCOUNTER — Ambulatory Visit
Admission: RE | Admit: 2023-02-15 | Discharge: 2023-02-15 | Disposition: A | Discharge status: Home / Self Care | Payer: 59 | Source: Ambulatory Visit | Attending: Nurse Practitioner | Admitting: Nurse Practitioner

## 2023-02-15 DIAGNOSIS — Z780 Asymptomatic menopausal state: Secondary | ICD-10-CM

## 2023-02-17 ENCOUNTER — Ambulatory Visit: Payer: Medicaid Other | Admitting: Podiatry

## 2023-03-12 ENCOUNTER — Ambulatory Visit: Payer: Self-pay | Admitting: *Deleted

## 2023-03-12 ENCOUNTER — Other Ambulatory Visit: Payer: Self-pay

## 2023-03-12 NOTE — Telephone Encounter (Signed)
  Chief Complaint: Patient states she has Januvia listed on her current medication list- she has never taken it- she is unhappy with glipizide- she believes she has too much fluctuation. Is there another option?  Symptoms: patient can tell when her glucose is not normal- she has symptoms- sweating, jittery  Disposition: [] ED /[] Urgent Care (no appt availability in office) / [] Appointment(In office/virtual)/ []  [facility]/ [] Home Care/ [] Refused Recommended Disposition /[] [facility]/ [x]  Follow-up with PCP Additional Notes: Patient wants to know if she has other options- she is willing to come into office to discuss- needs am appointment

## 2023-03-12 NOTE — Telephone Encounter (Signed)
Says she wants to have it filled if she should be taking it   ----- Message from [place] E sent at 03/12/2023 12:01 PM EDT -----  Pt has medication questions about Januvia, wants to speak to a nurse  Reason for Disposition  [1] Caller has NON-URGENT medicine question about med that PCP prescribed AND [2] triager unable to answer question  Answer Assessment - Initial Assessment Questions 1. NAME of MEDICINE: "What medicine(s) are you calling about?"     Patient is concerned about the Januvia on her list- patient does not ever remember taking it- listed as historical 2020.  Patient also has concerns about her glucose going too low on the glipizide- in the evening, sweating and jittery symptoms- 65-70, now 217- patient ate 11:30- patient states she is fluctuating- patient is concerned - would the [place] be option- patient can come in am for appointment   2. QUESTION: "What is your question?" (e.g., double dose of medicine, side effect)     See above 3. PRESCRIBER: "Who prescribed the medicine?" Reason: if prescribed by specialist, call should be referred to that group.     PCP  Protocols used: Medication Question Call-A-AH

## 2023-03-12 NOTE — Telephone Encounter (Signed)
Will forward to provider  

## 2023-03-14 NOTE — Telephone Encounter (Signed)
She can be scheduled for a video visit.

## 2023-03-15 NOTE — Telephone Encounter (Signed)
Unable to reach patient by phone. Voicemail left to schedule appointment.

## 2023-03-19 ENCOUNTER — Other Ambulatory Visit: Payer: Self-pay | Admitting: Nurse Practitioner

## 2023-03-26 ENCOUNTER — Other Ambulatory Visit: Payer: Self-pay | Admitting: Nurse Practitioner

## 2023-03-26 ENCOUNTER — Other Ambulatory Visit: Payer: Self-pay | Admitting: *Deleted

## 2023-03-26 DIAGNOSIS — E039 Hypothyroidism, unspecified: Secondary | ICD-10-CM

## 2023-03-26 MED ORDER — LEVOTHYROXINE SODIUM 175 MCG PO TABS: 175.0000 ug | ORAL_TABLET | Freq: Every day | ORAL | 0 refills | Status: DC

## 2023-03-26 MED ORDER — LEVOTHYROXINE SODIUM 175 MCG PO TABS
175.0000 ug | ORAL_TABLET | Freq: Every day | ORAL | 0 refills | Status: DC
Start: 2023-03-26 — End: 2023-04-16

## 2023-03-31 ENCOUNTER — Telehealth: Payer: Self-pay | Admitting: Nurse Practitioner

## 2023-03-31 DIAGNOSIS — E1165 Type 2 diabetes mellitus with hyperglycemia: Secondary | ICD-10-CM

## 2023-03-31 MED ORDER — ACCU-CHEK GUIDE TEST VI STRP: ORAL_STRIP | 12 refills | Status: DC

## 2023-03-31 NOTE — Telephone Encounter (Signed)
Medication Refill -  Most Recent Primary Care Visit:  Provider: Bertram Denver W  Department: [facility]  Visit Type: OFFICE VISIT  Date: 01/12/2023  Medication: glucose blood (ACCU-CHEK GUIDE) test strip [[id number]]   Has the patient contacted their pharmacy? Yes  (Agent: If yes, when and what did the pharmacy advise?) Contact office   Is this the correct pharmacy for this prescription? Yes  This is the patient's preferred pharmacy:  [facility] [address] [place], [place] - [address] [place] [address] Phone: [phone] Fax: [phone]   Has the prescription been filled recently? Yes  Is the patient out of the medication? Yes  Has the patient been seen for an appointment in the last year OR does the patient have an upcoming appointment? Yes  Can we respond through MyChart? No  Agent: Please be advised that Rx refills may take up to 3 business days. We ask that you follow-up with your pharmacy.

## 2023-03-31 NOTE — Telephone Encounter (Signed)
Unable to reorder per the message below. Will route to the office for new Rx.

## 2023-03-31 NOTE — Telephone Encounter (Signed)
Rx sent to pharmacy   

## 2023-04-01 ENCOUNTER — Telehealth: Payer: Self-pay | Admitting: Nurse Practitioner

## 2023-04-01 NOTE — Telephone Encounter (Signed)
Patient states that her insurance is requiring directions listed on how many times per day she is supposed to test her blood sugar for the test strips and lancets. Please advise.

## 2023-04-01 NOTE — Telephone Encounter (Signed)
Pt is calling in returning a call from Cassandra.

## 2023-04-01 NOTE — Telephone Encounter (Signed)
Call placed to patient unable to reach message left on VM.   

## 2023-04-02 NOTE — Progress Notes (Signed)
Pt declined SDOH questionnaire.

## 2023-04-02 NOTE — Telephone Encounter (Signed)
Call placed to patient unable to reach message left on VM.   Call to advise that Patient my take BS before eating in the AM and during day if she feels any s/s of high or low BS such as dizziness, Light -headiness Confusion or sluggishness or feeling clammy or sweaty. She can also check at night before going to bed.

## 2023-04-15 ENCOUNTER — Ambulatory Visit: Payer: 59 | Admitting: Physician Assistant

## 2023-04-16 ENCOUNTER — Encounter: Payer: Self-pay | Admitting: Nurse Practitioner

## 2023-04-16 ENCOUNTER — Ambulatory Visit: Payer: 59 | Attending: Nurse Practitioner | Admitting: Nurse Practitioner

## 2023-04-16 VITALS — BP 131/75 | HR 76 | Ht 65.0 in | Wt 158.6 lb

## 2023-04-16 DIAGNOSIS — E119 Type 2 diabetes mellitus without complications: Secondary | ICD-10-CM | POA: Diagnosis not present

## 2023-04-16 DIAGNOSIS — E1165 Type 2 diabetes mellitus with hyperglycemia: Secondary | ICD-10-CM

## 2023-04-16 DIAGNOSIS — K219 Gastro-esophageal reflux disease without esophagitis: Secondary | ICD-10-CM

## 2023-04-16 DIAGNOSIS — E039 Hypothyroidism, unspecified: Secondary | ICD-10-CM | POA: Diagnosis not present

## 2023-04-16 DIAGNOSIS — R232 Flushing: Secondary | ICD-10-CM

## 2023-04-16 DIAGNOSIS — Z7984 Long term (current) use of oral hypoglycemic drugs: Secondary | ICD-10-CM

## 2023-04-16 LAB — GLUCOSE, POCT (MANUAL RESULT ENTRY): POC Glucose: 149 mg/dL — AB (ref 70–99)

## 2023-04-16 LAB — POCT GLYCOSYLATED HEMOGLOBIN (HGB A1C): Hemoglobin A1C: 7.3 % — AB (ref 4.0–5.6)

## 2023-04-16 MED ORDER — GLUCOSE BLOOD VI STRP
ORAL_STRIP | 6 refills | Status: DC
Start: 2023-04-16 — End: 2023-11-19

## 2023-04-16 MED ORDER — GLIMEPIRIDE 1 MG PO TABS
1.0000 mg | ORAL_TABLET | Freq: Every day | ORAL | 2 refills | Status: DC
Start: 2023-04-16 — End: 2023-11-01

## 2023-04-16 MED ORDER — TRUE METRIX BLOOD GLUCOSE TEST VI STRP
ORAL_STRIP | 12 refills | Status: DC
Start: 2023-04-16 — End: 2023-06-29

## 2023-04-16 MED ORDER — PAROXETINE HCL 10 MG PO TABS
10.0000 mg | ORAL_TABLET | Freq: Every day | ORAL | 1 refills | Status: DC
Start: 2023-04-16 — End: 2023-06-29

## 2023-04-16 MED ORDER — ONETOUCH DELICA LANCETS 30G MISC
1.0000 | Freq: Every day | 11 refills | Status: AC
Start: 2023-04-16 — End: 2023-05-16

## 2023-04-16 MED ORDER — LEVOTHYROXINE SODIUM 175 MCG PO TABS
175.0000 ug | ORAL_TABLET | Freq: Every day | ORAL | 0 refills | Status: DC
Start: 2023-04-16 — End: 2023-09-13

## 2023-04-16 MED ORDER — PANTOPRAZOLE SODIUM 40 MG PO TBEC
40.0000 mg | DELAYED_RELEASE_TABLET | Freq: Every day | ORAL | 1 refills | Status: DC
Start: 2023-04-16 — End: 2023-10-08

## 2023-04-16 MED ORDER — ONETOUCH VERIO W/DEVICE KIT: PACK | 0 refills | Status: DC

## 2023-04-16 MED ORDER — ONETOUCH DELICA LANCING DEV MISC: 99 refills | Status: DC

## 2023-04-16 NOTE — Progress Notes (Signed)
Assessment & Plan:  [name] was seen today for hot flashes and night sweats.  Diagnoses and all orders for this visit:  Diabetes mellitus treated with oral medication (HCC) -     POCT glycosylated hemoglobin (Hb A1C) -     POCT glucose (manual entry) -     glimepiride (AMARYL) 1 MG tablet; Take 1-2 tablets (1-2 mg total) by mouth daily before breakfast. -     OneTouch Delica Lancets 30G MISC; 1 applicator by Subdermal route daily. Use as instructed. Check blood glucose level by fingerstick twice per day. E11.65 -     Blood Glucose Monitoring Suppl (ONETOUCH VERIO) w/Device KIT; Inject into the skin twice daily E11.65 -     Lancet Devices (ONE TOUCH DELICA LANCING DEV) MISC; Inject into the skin twice daily E11.65 -     glucose blood (TRUE METRIX BLOOD GLUCOSE TEST) test strip; Use as instructed -     glucose blood test strip; ONE TOUCH STRIPS. Inject into the skin twice daily E11.65 -     CMP14+ Continue blood sugar control as discussed in office today, low carbohydrate diet, and regular physical exercise as tolerated, 150 minutes per week (30 min each day, 5 days per week, or 50 min 3 days per week). Keep blood sugar logs with fasting goal of 90-130 mg/dl, post prandial (after you eat) less than 180.  For Hypoglycemia: BS <60 and Hyperglycemia BS >400; contact the clinic ASAP. Annual eye exams and foot exams are recommended.   Hypothyroidism, unspecified type -     levothyroxine (SYNTHROID) 175 MCG tablet; Take 1 tablet (175 mcg total) by mouth daily. -     Thyroid Panel With TSH Take medication every day at the same time on an empty stomach  GERD without esophagitis -     pantoprazole (PROTONIX) 40 MG tablet; Take 1 tablet (40 mg total) by mouth daily. INSTRUCTIONS: Avoid GERD Triggers: acidic, spicy or fried foods, caffeine, coffee, sodas,  alcohol and chocolate.     Hot flashes -     PARoxetine (PAXIL) 10 MG tablet; Take 1 tablet (10 mg total) by mouth daily. For hot  flashes    Patient has been counseled on age-appropriate routine health concerns for screening and prevention. These are reviewed and up-to-date. Referrals have been placed accordingly. Immunizations are up-to-date or declined.    Subjective:   Chief Complaint  Patient presents with   Hot Flashes   Night Sweats    Melissa Harmon 67 y.o. female presents to office today for follow up to DM and HTN She is also concerned about hot flashes she has been experiencing.    Patient has been counseled on age-appropriate routine health concerns for screening and prevention. These are reviewed and up-to-date. Referrals have been placed accordingly. Immunizations are up-to-date or declined.    MAMMOGRAM: UTD.  PAP: UTD   She has a history of IBS, Hypothyroidism, DM,  HTN, OA, Fibromyalgia   Blood pressure is well controlled. She is currently managing with lifestyle changes.  BP Readings from Last 3 Encounters:  04/16/23 131/75  04/02/23 (!) 146/84  02/11/23 120/72    DM 2 She could not tolerate low dose metformin. A1c not at goal at this time. Will add low dose glimepiride.  Lab Results  Component Value Date   HGBA1C 7.3 (A) 04/16/2023  LDL not at goal with high intensity statin.  Lab Results  Component Value Date   LDLCALC 185 (H) 05/20/2009  Hypothyroidism Normal thyroid levels.   Review of Systems  Constitutional:  Negative for fever, malaise/fatigue and weight loss.       Hot flashes  HENT: Negative.  Negative for nosebleeds.   Eyes: Negative.  Negative for blurred vision, double vision and photophobia.  Respiratory: Negative.  Negative for cough and shortness of breath.   Cardiovascular: Negative.  Negative for chest pain, palpitations and leg swelling.  Gastrointestinal: Negative.  Negative for heartburn, nausea and vomiting.  Musculoskeletal: Negative.  Negative for myalgias.  Neurological: Negative.  Negative for dizziness, focal weakness, seizures and headaches.   Psychiatric/Behavioral: Negative.  Negative for suicidal ideas.     Past Medical History:  Diagnosis Date   Bronchitis    Carpal tunnel syndrome on both sides    Family history of breast cancer    Family history of colonic polyps    Family history of lung cancer    Family history of ovarian cancer    Family history of stomach cancer    Fibromyalgia    GERD (gastroesophageal reflux disease)    History of chronic bronchitis    History of Hashimoto thyroiditis    s/p  bilateral thryoidectomy 03/ 2001   Hyperlipidemia    Hypothyroidism, postsurgical followed by pcp   08-04-1999 s/p  bilateral thyroidectomy for multinodular goitar (per path hashimoto's thyroiditis)   Irritable bowel syndrome with constipation    MDD (major depressive disorder)    Pelvic pain    Right ovarian cyst    Type 2 diabetes mellitus (HCC)    followed by pcp  (08-24-2019 pt stated checks blood sugar dialy in am,  fasting-- 159-175)   Wears contact lenses    Wears dentures    upper    Past Surgical History:  Procedure Laterality Date   CESAREAN SECTION  1986   COLONOSCOPY  last one 05-25-2018   LAPAROSCOPIC UNILATERAL SALPINGO OOPHERECTOMY Bilateral 09/01/2019   Procedure: LAPAROSCOPIC BILATERAL SALPINGO OOPHORECTOMY;  Surgeon: Edwinna Areola, DO;  Location: [facility];  Service: Gynecology;  Laterality: Bilateral;   THYROIDECTOMY Bilateral 08-04-1999  dr Lurene Shadow  @[facility]    subtotal   UPPER GASTROINTESTINAL ENDOSCOPY      Family History  Problem Relation Age of Onset   Diabetes Mother    Heart disease Mother    Stroke Mother    Colon cancer Father    Bipolar disorder Father    Colon polyps Father        unknown number   Ovarian cancer Sister 4       or other gynecologic cancer   Schizophrenia Brother    Heart disease Brother    Diverticulitis Maternal Aunt    Stomach cancer Maternal Grandmother        dx ?   Heart attack Maternal Grandmother    Lung cancer Cousin         dx 57s (maternal first cousin)   Breast cancer Cousin        dx 60s/70s (maternal first cousin)   Heart disease Half-Brother    Colon polyps Other 25       >80 polyps (6th degree relative - mother's cousin's grandson)   Esophageal cancer Neg Hx    Rectal cancer Neg Hx     Social History Reviewed with no changes to be made today.   Outpatient Medications Prior to Visit  Medication Sig Dispense Refill   Accu-Chek Softclix Lancets lancets Use to check blood sugar twice daily. E11.65 100 each 2  albuterol (VENTOLIN HFA) 108 (90 Base) MCG/ACT inhaler Inhale 2 puffs into the lungs every 6 (six) hours as needed for wheezing. 18 g 1   atorvastatin (LIPITOR) 40 MG tablet Take 1 tablet (40 mg total) by mouth daily. 90 tablet 3   Blood Glucose Monitoring Suppl (ACCU-CHEK GUIDE) w/Device KIT Use to check blood sugar twice daily. E11.65 1 kit 0   cetirizine (ZYRTEC) 10 MG tablet Take 1 tablet by mouth daily.     docusate sodium (COLACE) 100 MG capsule Take 1 capsule (100 mg total) by mouth 2 (two) times daily as needed for mild constipation. 10 capsule 1   glucose blood (ACCU-CHEK GUIDE TEST) test strip Use as instructed 100 each 12   Lancets MISC as directed :E11.40 - OneTouch Verio for 30     naproxen (NAPROSYN) 250 MG tablet Take 1 tablet (250 mg total) by mouth 2 (two) times daily with a meal. 60 tablet 0   triamcinolone (KENALOG) 0.025 % ointment Apply 1 Application topically 2 (two) times daily. 60 g 1   doxycycline (VIBRAMYCIN) 100 MG capsule Take 1 capsule (100 mg total) by mouth 2 (two) times daily. 20 capsule 0   glipiZIDE (GLUCOTROL XL) 5 MG 24 hr tablet Take 1 tablet (5 mg total) by mouth daily with breakfast. (Patient taking differently: Take 2.5 mg by mouth daily with breakfast.) 90 tablet 1   glucose blood test strip 1 each by Other route as needed.     levothyroxine (SYNTHROID) 175 MCG tablet Take 1 tablet (175 mcg total) by mouth daily. 30 tablet 0   linaclotide (LINZESS) 145 MCG  CAPS capsule Take 145 mcg by mouth daily before breakfast.     pantoprazole (PROTONIX) 40 MG tablet Take 1 tablet (40 mg total) by mouth daily. 90 tablet 1   Pediatric Multivit-Minerals-C (CHEWABLES MULTIVITAMIN PO) as directed Orally once a day for 30 days     albuterol (PROAIR HFA) 108 (90 Base) MCG/ACT inhaler Inhale 2 puffs into the lungs every 4 (four) hours as needed for wheezing or shortness of breath. (Patient not taking: Reported on 04/16/2023)     ammonium lactate (AMLACTIN) 12 % lotion Apply 1 Application topically as needed for dry skin. (Patient not taking: Reported on 04/16/2023) 400 g 0   Lidocaine 5 % CREA Apply topically as prescribed (Patient not taking: Reported on 04/16/2023) 30 g 1   atorvastatin (LIPITOR) 40 MG tablet Take 40 mg by mouth daily. (Patient not taking: Reported on 04/16/2023)     LINZESS 145 MCG CAPS capsule Take 145 mcg by mouth every morning. (Patient not taking: Reported on 04/16/2023)     metFORMIN (GLUCOPHAGE-XR) 500 MG 24 hr tablet Take 500 mg by mouth as directed. (Patient not taking: Reported on 04/16/2023)     pantoprazole (PROTONIX) 40 MG tablet Take 40 mg by mouth daily. (Patient not taking: Reported on 04/16/2023)     sitaGLIPtin (JANUVIA) 50 MG tablet Take 50 mg by mouth daily. (Patient not taking: Reported on 04/16/2023)     Facility-Administered Medications Prior to Visit  Medication Dose Route Frequency Provider Last Rate Last Admin   0.9 %  sodium chloride infusion  500 mL Intravenous Once Nandigam, Eleonore Chiquito, MD        Allergies  Allergen Reactions   Gabapentin Other (See Comments)    Other Reaction(s): memory changes   Metformin And Related Other (See Comments)    GI symptoms; Constipation   Sulfa Antibiotics     Other Reaction(s): hives  Sulfamethoxazole Hives   Other Rash       Objective:    BP 131/75 (BP Location: Left Arm, Patient Position: Sitting, Cuff Size: Normal)   Pulse 76   Ht 5\' 5"  (1.651 m)   Wt 158 lb 9.6 oz (71.9 kg)    SpO2 96%   BMI 26.39 kg/m  Wt Readings from Last 3 Encounters:  04/16/23 158 lb 9.6 oz (71.9 kg)  02/11/23 156 lb (70.8 kg)  01/12/23 152 lb 9.6 oz (69.2 kg)    Physical Exam Vitals and nursing note reviewed.  Constitutional:      Appearance: She is well-developed.  HENT:     Head: Normocephalic and atraumatic.  Cardiovascular:     Rate and Rhythm: Normal rate and regular rhythm.     Heart sounds: Normal heart sounds. No murmur heard.    No friction rub. No gallop.  Pulmonary:     Effort: Pulmonary effort is normal. No tachypnea or respiratory distress.     Breath sounds: Normal breath sounds. No decreased breath sounds, wheezing, rhonchi or rales.  Chest:     Chest wall: No tenderness.  Abdominal:     General: Bowel sounds are normal.     Palpations: Abdomen is soft.  Musculoskeletal:        General: Normal range of motion.     Cervical back: Normal range of motion.  Skin:    General: Skin is warm and dry.  Neurological:     Mental Status: She is alert and oriented to person, place, and time.     Coordination: Coordination normal.  Psychiatric:        Behavior: Behavior normal. Behavior is cooperative.        Thought Content: Thought content normal.        Judgment: Judgment normal.          Patient has been counseled extensively about nutrition and exercise as well as the importance of adherence with medications and regular follow-up. The patient was given clear instructions to go to ER or return to medical center if symptoms don't improve, worsen or new problems develop. The patient verbalized understanding.   Follow-up: Return in 3 months (on 07/15/2023), or if symptoms worsen or fail to improve.   Claiborne Rigg, FNP-BC [facility] [place], [place] [phone]   04/25/2023, 10:25 PM

## 2023-04-17 LAB — CMP14+EGFR
ALT: 59 [IU]/L — ABNORMAL HIGH (ref 0–32)
AST: 43 [IU]/L — ABNORMAL HIGH (ref 0–40)
Albumin: 4.3 g/dL (ref 3.9–4.9)
Alkaline Phosphatase: 152 [IU]/L — ABNORMAL HIGH (ref 44–121)
BUN/Creatinine Ratio: 13 (ref 12–28)
BUN: 10 mg/dL (ref 8–27)
Bilirubin Total: 0.5 mg/dL (ref 0.0–1.2)
CO2: 24 mmol/L (ref 20–29)
Calcium: 9.4 mg/dL (ref 8.7–10.3)
Chloride: 104 mmol/L (ref 96–106)
Creatinine, Ser: 0.8 mg/dL (ref 0.57–1.00)
Globulin, Total: 2.7 g/dL (ref 1.5–4.5)
Glucose: 131 mg/dL — ABNORMAL HIGH (ref 70–99)
Potassium: 4.1 mmol/L (ref 3.5–5.2)
Sodium: 140 mmol/L (ref 134–144)
Total Protein: 7 g/dL (ref 6.0–8.5)
eGFR: 82 mL/min/{1.73_m2} (ref >59)

## 2023-04-17 LAB — THYROID PANEL WITH TSH
Free Thyroxine Index: 2.4 (ref 1.2–4.9)
T3 Uptake Ratio: 29 % (ref 24–39)
T4, Total: 8.3 ug/dL (ref 4.5–12.0)
TSH: 4.11 u[IU]/mL (ref 0.450–4.500)

## 2023-04-25 ENCOUNTER — Encounter: Payer: Self-pay | Admitting: Nurse Practitioner

## 2023-05-13 ENCOUNTER — Encounter: Payer: Self-pay | Admitting: *Deleted

## 2023-05-13 NOTE — Progress Notes (Signed)
 Pt attended 04/02/23 screening event where her b/p was 146/84 on retake. At the event, pt noted her PCP was Haze Servant, NP, at the [facility], that she does have insurance, is a smoker, and pt did not identify any SDOH insecurities. Per chart review, pt was last seen by her PCP on 04/16/23, where her b/p was 131/75; her blood sugar was 149, and her A1C was 7.3. Chart review also indicates pt has future appt with her PCP on 07/12/23, and has ongoing rheumatology and podiatry support. No additional health equity team support indicated at this time.

## 2023-05-19 NOTE — Progress Notes (Deleted)
Office Visit Note  Patient: Melissa Harmon             Date of Birth: Jan 21, 1956           MRN: [id number]             PCP: Claiborne Rigg, NP Referring: Claiborne Rigg, NP Visit Date: 06/02/2023 Occupation: @GUAROCC @  Subjective:  No chief complaint on file.   History of Present Illness: Melissa Harmon is a 68 y.o. female ***     Activities of Daily Living:  Patient reports morning stiffness for *** {minute/hour:19697}.   Patient {ACTIONS;DENIES/REPORTS:21021675::"Denies"} nocturnal pain.  Difficulty dressing/grooming: {ACTIONS;DENIES/REPORTS:21021675::"Denies"} Difficulty climbing stairs: {ACTIONS;DENIES/REPORTS:21021675::"Denies"} Difficulty getting out of chair: {ACTIONS;DENIES/REPORTS:21021675::"Denies"} Difficulty using hands for taps, buttons, cutlery, and/or writing: {ACTIONS;DENIES/REPORTS:21021675::"Denies"}  No Rheumatology ROS completed.   PMFS History:  Patient Active Problem List   Diagnosis Date Noted   Pain due to onychomycosis of toenails of both feet 05/15/2022   Anxiety 11/26/2021   Atopic dermatitis 11/26/2021   Irritable bowel syndrome with constipation 11/26/2021   Type 2 diabetes mellitus with hyperglycemia (HCC) 11/26/2021   Unspecified condition associated with female genital organs and menstrual cycle 11/26/2021   Diabetes mellitus with neuropathy (HCC) 06/07/2020   Diabetic neuropathy (HCC) 06/07/2020   Genetic testing 05/22/2020   Family history of colonic polyps    Family history of ovarian cancer    Family history of stomach cancer    Family history of lung cancer    Family history of breast cancer    Adjustment disorder with anxious mood 09/18/2017   Insomnia due to other mental disorder 09/18/2017   DYSURIA 10/25/2009   ABDOMINAL PAIN, RIGHT UPPER QUADRANT 10/25/2009   NECK PAIN, LEFT 08/23/2009   HAND PAIN, RIGHT 08/23/2009   ELBOW PAIN, BILATERAL 05/17/2009   Tobacco user 05/13/2009   DIABETES MELLITUS, TYPE II, WITHOUT  COMPLICATIONS 12/14/2008   DYSLIPIDEMIA 12/14/2008   SHORTNESS OF BREATH 12/14/2008   RECTAL BLEEDING 04/22/2007   History of colonic polyps 04/22/2007   BLOOD IN STOOL 04/15/2007   PITYRIASIS ROSEA 08/09/2006   HYPOTHYROIDISM, UNSPECIFIED 07/08/2006   Esophageal reflux 07/08/2006   FIBROMYALGIA, FIBROMYOSITIS 07/08/2006    Past Medical History:  Diagnosis Date   Bronchitis    Carpal tunnel syndrome on both sides    Family history of breast cancer    Family history of colonic polyps    Family history of lung cancer    Family history of ovarian cancer    Family history of stomach cancer    Fibromyalgia    GERD (gastroesophageal reflux disease)    History of chronic bronchitis    History of Hashimoto thyroiditis    s/p  bilateral thryoidectomy 03/ 2001   Hyperlipidemia    Hypothyroidism, postsurgical followed by pcp   08-04-1999 s/p  bilateral thyroidectomy for multinodular goitar (per path hashimoto's thyroiditis)   Irritable bowel syndrome with constipation    MDD (major depressive disorder)    Pelvic pain    Right ovarian cyst    Type 2 diabetes mellitus (HCC)    followed by pcp  (08-24-2019 pt stated checks blood sugar dialy in am,  fasting-- 159-175)   Wears contact lenses    Wears dentures    upper    Family History  Problem Relation Age of Onset   Diabetes Mother    Heart disease Mother    Stroke Mother    Colon cancer Father    Bipolar disorder Father  Colon polyps Father        unknown number   Ovarian cancer Sister 54       or other gynecologic cancer   Schizophrenia Brother    Heart disease Brother    Diverticulitis Maternal Aunt    Stomach cancer Maternal Grandmother        dx ?   Heart attack Maternal Grandmother    Lung cancer Cousin        dx 19s (maternal first cousin)   Breast cancer Cousin        dx 60s/70s (maternal first cousin)   Heart disease Half-Brother    Colon polyps Other 25       >80 polyps (6th degree relative - mother's  cousin's grandson)   Esophageal cancer Neg Hx    Rectal cancer Neg Hx    Past Surgical History:  Procedure Laterality Date   CESAREAN SECTION  1986   COLONOSCOPY  last one 05-25-2018   LAPAROSCOPIC UNILATERAL SALPINGO OOPHERECTOMY Bilateral 09/01/2019   Procedure: LAPAROSCOPIC BILATERAL SALPINGO OOPHORECTOMY;  Surgeon: Edwinna Areola, DO;  Location: [facility];  Service: Gynecology;  Laterality: Bilateral;   THYROIDECTOMY Bilateral 08-04-1999  dr Lurene Shadow  @[facility]    subtotal   UPPER GASTROINTESTINAL ENDOSCOPY     Social History   Social History Narrative   Social Hx:   Current living situation- lives in [place] alone   Born in [place] and raised in [place] by mom and dad   Siblings 3 living siblings but total 5 (pt is number the youngest)   Schooling- HS grad   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   Married- divorced 1 yr ago but was separated for several years   Kids- 3   Legal issues- denies   Immunization History  Administered Date(s) Administered   Hep B, Unspecified 06/21/2018, 07/21/2018   Hepatitis B, ADULT 06/20/2018   Hepb-cpg 06/21/2018, 07/21/2018   Influenza Inj Mdck Quad Pf 02/22/2018, 02/25/2018   Influenza Split 03/10/2012, 01/04/2013, 03/25/2014, 02/09/2020   Influenza Whole 04/22/2007, 03/08/2009   Influenza, Seasonal, Injecte, Preservative Fre 04/02/2021   Influenza,inj,Quad PF,6+ Mos 03/16/2018   PFIZER(Purple Top)SARS-COV-2 Vaccination 07/21/2019, 08/11/2019, 04/15/2020   Polio, Unspecified 11/22/1958, 01/17/1959, 08/22/1959, 10/21/1967   Smallpox 01/31/1959   Td 06/27/2008   Td (Adult),unspecified 10/21/1967   Tdap 06/27/2008, 11/12/2020   Zoster Recombinant(Shingrix) 11/12/2020, 02/07/2021     Objective: Vital Signs: There were no vitals taken for this visit.   Physical Exam   Musculoskeletal Exam: ***  CDAI Exam: CDAI Score: -- Patient Global: --; Provider Global: -- Swollen: --; Tender: -- Joint  Exam 06/02/2023   No joint exam has been documented for this visit   There is currently no information documented on the homunculus. Go to the Rheumatology activity and complete the homunculus joint exam.  Investigation: No additional findings.  Imaging: No results found.  Recent Labs: Lab Results  Component Value Date   WBC 10.2 01/12/2023   HGB 14.3 01/12/2023   PLT 315 01/12/2023   NA 140 04/16/2023   K 4.1 04/16/2023   CL 104 04/16/2023   CO2 24 04/16/2023   GLUCOSE 131 (H) 04/16/2023   BUN 10 04/16/2023   CREATININE 0.80 04/16/2023   BILITOT 0.5 04/16/2023   ALKPHOS 152 (H) 04/16/2023   AST 43 (H) 04/16/2023   ALT 59 (H) 04/16/2023   PROT 7.0 04/16/2023   ALBUMIN 4.3 04/16/2023   CALCIUM 9.4 04/16/2023   GFRAA 94 06/07/2020  Speciality Comments: No specialty comments available.  Procedures:  No procedures performed Allergies: Gabapentin, Metformin and related, Sulfa antibiotics, Sulfamethoxazole, and Other   Assessment / Plan:     Visit Diagnoses: No diagnosis found.  Orders: No orders of the defined types were placed in this encounter.  No orders of the defined types were placed in this encounter.   Face-to-face time spent with patient was *** minutes. Greater than 50% of time was spent in counseling and coordination of care.  Follow-Up Instructions: No follow-ups on file.   Pollyann Savoy, MD  Note - This record has been created using Animal nutritionist.  Chart creation errors have been sought, but may not always  have been located. Such creation errors do not reflect on  the standard of medical care.

## 2023-05-27 ENCOUNTER — Ambulatory Visit: Payer: 59 | Admitting: Podiatry

## 2023-05-28 ENCOUNTER — Telehealth: Payer: Self-pay

## 2023-05-28 NOTE — Telephone Encounter (Signed)
Called patient back and appointment made.

## 2023-05-28 NOTE — Telephone Encounter (Signed)
Patient needs a welcome to medicare visit around/on 7/1

## 2023-05-28 NOTE — Telephone Encounter (Signed)
Patient returning call.

## 2023-06-02 ENCOUNTER — Ambulatory Visit: Payer: Medicaid Other | Admitting: Rheumatology

## 2023-06-02 ENCOUNTER — Ambulatory Visit: Payer: 59 | Admitting: Podiatry

## 2023-06-02 DIAGNOSIS — Z720 Tobacco use: Secondary | ICD-10-CM

## 2023-06-02 DIAGNOSIS — Z8659 Personal history of other mental and behavioral disorders: Secondary | ICD-10-CM

## 2023-06-02 DIAGNOSIS — Z8719 Personal history of other diseases of the digestive system: Secondary | ICD-10-CM

## 2023-06-02 DIAGNOSIS — Z860100 Personal history of colon polyps, unspecified: Secondary | ICD-10-CM

## 2023-06-02 DIAGNOSIS — M19041 Primary osteoarthritis, right hand: Secondary | ICD-10-CM

## 2023-06-02 DIAGNOSIS — E119 Type 2 diabetes mellitus without complications: Secondary | ICD-10-CM

## 2023-06-02 DIAGNOSIS — Z8261 Family history of arthritis: Secondary | ICD-10-CM

## 2023-06-02 DIAGNOSIS — Z8639 Personal history of other endocrine, nutritional and metabolic disease: Secondary | ICD-10-CM

## 2023-06-02 DIAGNOSIS — M797 Fibromyalgia: Secondary | ICD-10-CM

## 2023-06-02 DIAGNOSIS — M19072 Primary osteoarthritis, left ankle and foot: Secondary | ICD-10-CM

## 2023-06-02 DIAGNOSIS — E785 Hyperlipidemia, unspecified: Secondary | ICD-10-CM

## 2023-06-02 DIAGNOSIS — M7552 Bursitis of left shoulder: Secondary | ICD-10-CM

## 2023-06-02 DIAGNOSIS — F5105 Insomnia due to other mental disorder: Secondary | ICD-10-CM

## 2023-06-10 ENCOUNTER — Ambulatory Visit: Payer: 59 | Admitting: Physician Assistant

## 2023-06-10 DIAGNOSIS — E119 Type 2 diabetes mellitus without complications: Secondary | ICD-10-CM

## 2023-06-15 NOTE — Progress Notes (Signed)
Office Visit Note  Patient: Melissa Harmon             Date of Birth: 06/24/1955           MRN: [id number]             PCP: Claiborne Rigg, NP Referring: Claiborne Rigg, NP Visit Date: 06/29/2023 Occupation: @GUAROCC @  Subjective:  Left hip pain  History of Present Illness: Melissa Harmon is a 68 y.o. female with osteoarthritis and fibromyalgia syndrome.  She states her left shoulder joint pain improved over time.  She continues to have some discomfort in her hands and her feet.  She states she has burning sensation in her feet at nighttime.  Recently she has been having discomfort in her left hip which she describes over the trochanteric bursa.  She has more discomfort when she sleeps on her left side.  She describes nocturnal pain in her left hip.    Activities of Daily Living:  Patient reports morning stiffness for 1 hour.   Patient Reports nocturnal pain.  Difficulty dressing/grooming: Denies Difficulty climbing stairs: Reports Difficulty getting out of chair: Reports Difficulty using hands for taps, buttons, cutlery, and/or writing: Reports  Review of Systems  Constitutional:  Positive for fatigue.  HENT:  Negative for mouth sores and mouth dryness.   Eyes:  Positive for dryness.  Respiratory:  Negative for shortness of breath.   Cardiovascular:  Negative for chest pain and palpitations.  Gastrointestinal:  Negative for blood in stool, constipation and diarrhea.  Endocrine: Negative for increased urination.  Genitourinary:  Negative for involuntary urination.  Musculoskeletal:  Positive for joint pain, joint pain, myalgias, muscle weakness, morning stiffness, muscle tenderness and myalgias. Negative for gait problem and joint swelling.  Skin:  Negative for color change, rash, hair loss and sensitivity to sunlight.  Allergic/Immunologic: Positive for susceptible to infections.  Neurological:  Positive for dizziness. Negative for headaches.  Hematological:  Negative for  swollen glands.  Psychiatric/Behavioral:  Positive for depressed mood and sleep disturbance. The patient is nervous/anxious.     PMFS History:  Patient Active Problem List   Diagnosis Date Noted   Pain due to onychomycosis of toenails of both feet 05/15/2022   Anxiety 11/26/2021   Atopic dermatitis 11/26/2021   Irritable bowel syndrome with constipation 11/26/2021   Type 2 diabetes mellitus with hyperglycemia (HCC) 11/26/2021   Unspecified condition associated with female genital organs and menstrual cycle 11/26/2021   Diabetes mellitus with neuropathy (HCC) 06/07/2020   Diabetic neuropathy (HCC) 06/07/2020   Genetic testing 05/22/2020   Family history of colonic polyps    Family history of ovarian cancer    Family history of stomach cancer    Family history of lung cancer    Family history of breast cancer    Adjustment disorder with anxious mood 09/18/2017   Insomnia due to other mental disorder 09/18/2017   DYSURIA 10/25/2009   ABDOMINAL PAIN, RIGHT UPPER QUADRANT 10/25/2009   NECK PAIN, LEFT 08/23/2009   HAND PAIN, RIGHT 08/23/2009   ELBOW PAIN, BILATERAL 05/17/2009   Tobacco user 05/13/2009   DIABETES MELLITUS, TYPE II, WITHOUT COMPLICATIONS 12/14/2008   DYSLIPIDEMIA 12/14/2008   SHORTNESS OF BREATH 12/14/2008   RECTAL BLEEDING 04/22/2007   History of colonic polyps 04/22/2007   BLOOD IN STOOL 04/15/2007   PITYRIASIS ROSEA 08/09/2006   HYPOTHYROIDISM, UNSPECIFIED 07/08/2006   Esophageal reflux 07/08/2006   FIBROMYALGIA, FIBROMYOSITIS 07/08/2006    Past Medical History:  Diagnosis Date  Bronchitis    Carpal tunnel syndrome on both sides    Family history of breast cancer    Family history of colonic polyps    Family history of lung cancer    Family history of ovarian cancer    Family history of stomach cancer    Fibromyalgia    GERD (gastroesophageal reflux disease)    History of chronic bronchitis    History of Hashimoto thyroiditis    s/p  bilateral  thryoidectomy 03/ 2001   Hyperlipidemia    Hypothyroidism, postsurgical followed by pcp   08-04-1999 s/p  bilateral thyroidectomy for multinodular goitar (per path hashimoto's thyroiditis)   Irritable bowel syndrome with constipation    MDD (major depressive disorder)    Pelvic pain    Right ovarian cyst    Type 2 diabetes mellitus (HCC)    followed by pcp  (08-24-2019 pt stated checks blood sugar dialy in am,  fasting-- 159-175)   Wears contact lenses    Wears dentures    upper    Family History  Problem Relation Age of Onset   Diabetes Mother    Heart disease Mother    Stroke Mother    Colon cancer Father    Bipolar disorder Father    Colon polyps Father        unknown number   Ovarian cancer Sister 86       or other gynecologic cancer   Schizophrenia Brother    Heart disease Brother    Diverticulitis Maternal Aunt    Stomach cancer Maternal Grandmother        dx ?   Heart attack Maternal Grandmother    Lung cancer Cousin        dx 49s (maternal first cousin)   Breast cancer Cousin        dx 60s/70s (maternal first cousin)   Heart disease Half-Brother    Colon polyps Other 25       >80 polyps (6th degree relative - mother's cousin's grandson)   Esophageal cancer Neg Hx    Rectal cancer Neg Hx    Past Surgical History:  Procedure Laterality Date   CESAREAN SECTION  1986   COLONOSCOPY  last one 05-25-2018   LAPAROSCOPIC UNILATERAL SALPINGO OOPHERECTOMY Bilateral 09/01/2019   Procedure: LAPAROSCOPIC BILATERAL SALPINGO OOPHORECTOMY;  Surgeon: Edwinna Areola, DO;  Location: [facility];  Service: Gynecology;  Laterality: Bilateral;   THYROIDECTOMY Bilateral 08-04-1999  dr Lurene Shadow  @[facility]    subtotal   UPPER GASTROINTESTINAL ENDOSCOPY     Social History   Social History Narrative   Social Hx:   Current living situation- lives in [place] alone   Born in [place] and raised in [place] by mom and dad   Siblings 3 living siblings but total  5 (pt is number the youngest)   Schooling- HS grad   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   Married- divorced 1 yr ago but was separated for several years   Kids- 3   Legal issues- denies   Immunization History  Administered Date(s) Administered   Hep B, Unspecified 06/21/2018, 07/21/2018   Hepatitis B, ADULT 06/20/2018   Hepb-cpg 06/21/2018, 07/21/2018   Influenza Inj Mdck Quad Pf 02/22/2018, 02/25/2018   Influenza Split 03/10/2012, 01/04/2013, 03/25/2014, 02/09/2020   Influenza Whole 04/22/2007, 03/08/2009   Influenza, Seasonal, Injecte, Preservative Fre 04/02/2021   Influenza,inj,Quad PF,6+ Mos 03/16/2018   PFIZER(Purple Top)SARS-COV-2 Vaccination 07/21/2019, 08/11/2019, 04/15/2020   Polio, Unspecified 11/22/1958, 01/17/1959, 08/22/1959, 10/21/1967  Smallpox 01/31/1959   Td 06/27/2008   Td (Adult),unspecified 10/21/1967   Tdap 06/27/2008, 11/12/2020   Zoster Recombinant(Shingrix) 11/12/2020, 02/07/2021     Objective: Vital Signs: BP 131/75 (BP Location: Left Arm, Patient Position: Sitting, Cuff Size: Small)   Pulse 84   Resp 13   Ht 5\' 3"  (1.6 m)   Wt 158 lb 12.8 oz (72 kg)   BMI 28.13 kg/m    Physical Exam Vitals and nursing note reviewed.  Constitutional:      Appearance: She is well-developed.  HENT:     Head: Normocephalic and atraumatic.  Eyes:     Conjunctiva/sclera: Conjunctivae normal.  Cardiovascular:     Rate and Rhythm: Normal rate and regular rhythm.     Heart sounds: Normal heart sounds.  Pulmonary:     Effort: Pulmonary effort is normal.     Breath sounds: Normal breath sounds.  Abdominal:     General: Bowel sounds are normal.     Palpations: Abdomen is soft.  Musculoskeletal:     Cervical back: Normal range of motion.  Lymphadenopathy:     Cervical: No cervical adenopathy.  Skin:    General: Skin is warm and dry.     Capillary Refill: Capillary refill takes less than 2 seconds.  Neurological:     Mental Status: She is alert and  oriented to person, place, and time.  Psychiatric:        Behavior: Behavior normal.      Musculoskeletal Exam: She had good range of motion of the cervical, thoracic and lumbar spine.  Shoulders, elbows, wrist joints were in good range of motion without any discomfort.  Wrist joints with good range of motion.  Bilateral [facility] PIP and DIP thickening with no synovitis was noted.  Hip joints in good range of motion.  She had tenderness over left trochanteric bursa.  Knee joints with good range of motion without any warmth swelling or effusion.  There was no tenderness over ankles or MTPs.  CDAI Exam: CDAI Score: -- Patient Global: --; Provider Global: -- Swollen: --; Tender: -- Joint Exam 06/29/2023   No joint exam has been documented for this visit   There is currently no information documented on the homunculus. Go to the Rheumatology activity and complete the homunculus joint exam.  Investigation: No additional findings.  Imaging: No results found.  Recent Labs: Lab Results  Component Value Date   WBC 10.2 01/12/2023   HGB 14.3 01/12/2023   PLT 315 01/12/2023   NA 140 04/16/2023   K 4.1 04/16/2023   CL 104 04/16/2023   CO2 24 04/16/2023   GLUCOSE 131 (H) 04/16/2023   BUN 10 04/16/2023   CREATININE 0.80 04/16/2023   BILITOT 0.5 04/16/2023   ALKPHOS 152 (H) 04/16/2023   AST 43 (H) 04/16/2023   ALT 59 (H) 04/16/2023   PROT 7.0 04/16/2023   ALBUMIN 4.3 04/16/2023   CALCIUM 9.4 04/16/2023   GFRAA 94 06/07/2020    Speciality Comments: No specialty comments available.  Procedures:  No procedures performed Allergies: Gabapentin, Metformin and related, Sulfa antibiotics, Sulfamethoxazole, and Other   Assessment / Plan:     Visit Diagnoses: Subacromial bursitis of left shoulder joint-patient states that her symptoms gradually improved after doing exercises for the shoulder.  She good range of motion of her left shoulder.  Primary osteoarthritis of both hands -she gives  history of intermittent discomfort in her hands.  No synovitis was noted.  Joint protection muscle strengthening was discussed.  X-rays in the past showed early osteoarthritic changes.  Autoimmune workup was negative.  Trochanteric bursitis, left hip-she has been having discomfort over left trochanteric bursa.  She also complains of nocturnal pain.-Avoid cortisone injection as she is diabetic.  IT band stretches were demonstrated in the office and a handout was given.  Primary osteoarthritis of both feet - she is followed by a podiatrist. X-rays in the past showed early osteoarthritic changes in calcaneal spurs.  Fibromyalgia - History of generalized pain, hyperalgesia and positive tender points for many years.  Intolerance to pregabalin in the past.  Need for regular exercise was emphasized.  Family history of rheumatoid arthritis-maternal aunts  Insomnia due to other mental disorder  Type 2 diabetes mellitus without complication, without long-term current use of insulin (HCC)  Paresthesia of both feet-patient has been experiencing paresthesias in her bilateral feet recently.  She requested a neurology referral.  Ambulatory referral to Neurology was placed.  History of gastroesophageal reflux (GERD)  Dyslipidemia  Hx of colonic polyps  History of hypothyroidism  History of depression  Tobacco use - 1/4 PPD X25 years  Orders: Orders Placed This Encounter  Procedures   Ambulatory referral to Neurology   No orders of the defined types were placed in this encounter.    Follow-Up Instructions: Return in about 6 months (around 12/27/2023) for Osteoarthritis.   Pollyann Savoy, MD  Note - This record has been created using Animal nutritionist.  Chart creation errors have been sought, but may not always  have been located. Such creation errors do not reflect on  the standard of medical care.

## 2023-06-17 DIAGNOSIS — E039 Hypothyroidism, unspecified: Secondary | ICD-10-CM | POA: Diagnosis not present

## 2023-06-17 DIAGNOSIS — J302 Other seasonal allergic rhinitis: Secondary | ICD-10-CM | POA: Diagnosis not present

## 2023-06-17 DIAGNOSIS — J42 Unspecified chronic bronchitis: Secondary | ICD-10-CM | POA: Diagnosis not present

## 2023-06-17 DIAGNOSIS — E785 Hyperlipidemia, unspecified: Secondary | ICD-10-CM | POA: Diagnosis not present

## 2023-06-17 DIAGNOSIS — K219 Gastro-esophageal reflux disease without esophagitis: Secondary | ICD-10-CM | POA: Diagnosis not present

## 2023-06-17 DIAGNOSIS — N644 Mastodynia: Secondary | ICD-10-CM | POA: Diagnosis not present

## 2023-06-17 DIAGNOSIS — F17200 Nicotine dependence, unspecified, uncomplicated: Secondary | ICD-10-CM | POA: Diagnosis not present

## 2023-06-17 DIAGNOSIS — Z0001 Encounter for general adult medical examination with abnormal findings: Secondary | ICD-10-CM | POA: Diagnosis not present

## 2023-06-19 ENCOUNTER — Other Ambulatory Visit: Payer: Self-pay | Admitting: Nurse Practitioner

## 2023-06-19 ENCOUNTER — Other Ambulatory Visit: Payer: Self-pay | Admitting: Podiatry

## 2023-06-20 MED ORDER — AMMONIUM LACTATE 12 % EX LOTN: 1.0000 | TOPICAL_LOTION | CUTANEOUS | 0 refills | Status: DC | PRN

## 2023-06-21 ENCOUNTER — Other Ambulatory Visit: Payer: Self-pay

## 2023-06-21 MED ORDER — ALBUTEROL SULFATE HFA 108 (90 BASE) MCG/ACT IN AERS
2.0000 | INHALATION_SPRAY | RESPIRATORY_TRACT | 0 refills | Status: DC | PRN
  Filled 2023-06-21: qty 18, 17d supply, fill #0

## 2023-06-29 ENCOUNTER — Encounter: Payer: Self-pay | Admitting: Rheumatology

## 2023-06-29 ENCOUNTER — Ambulatory Visit: Payer: 59 | Attending: Rheumatology | Admitting: Rheumatology

## 2023-06-29 VITALS — BP 131/75 | HR 84 | Resp 13 | Ht 63.0 in | Wt 158.8 lb

## 2023-06-29 DIAGNOSIS — M797 Fibromyalgia: Secondary | ICD-10-CM | POA: Diagnosis not present

## 2023-06-29 DIAGNOSIS — R202 Paresthesia of skin: Secondary | ICD-10-CM | POA: Diagnosis not present

## 2023-06-29 DIAGNOSIS — M19071 Primary osteoarthritis, right ankle and foot: Secondary | ICD-10-CM

## 2023-06-29 DIAGNOSIS — Z860100 Personal history of colon polyps, unspecified: Secondary | ICD-10-CM | POA: Diagnosis not present

## 2023-06-29 DIAGNOSIS — E785 Hyperlipidemia, unspecified: Secondary | ICD-10-CM | POA: Diagnosis not present

## 2023-06-29 DIAGNOSIS — E119 Type 2 diabetes mellitus without complications: Secondary | ICD-10-CM

## 2023-06-29 DIAGNOSIS — M7552 Bursitis of left shoulder: Secondary | ICD-10-CM

## 2023-06-29 DIAGNOSIS — F5105 Insomnia due to other mental disorder: Secondary | ICD-10-CM

## 2023-06-29 DIAGNOSIS — M19072 Primary osteoarthritis, left ankle and foot: Secondary | ICD-10-CM

## 2023-06-29 DIAGNOSIS — M19041 Primary osteoarthritis, right hand: Secondary | ICD-10-CM

## 2023-06-29 DIAGNOSIS — Z8719 Personal history of other diseases of the digestive system: Secondary | ICD-10-CM

## 2023-06-29 DIAGNOSIS — Z8659 Personal history of other mental and behavioral disorders: Secondary | ICD-10-CM

## 2023-06-29 DIAGNOSIS — Z720 Tobacco use: Secondary | ICD-10-CM

## 2023-06-29 DIAGNOSIS — Z8261 Family history of arthritis: Secondary | ICD-10-CM | POA: Diagnosis not present

## 2023-06-29 DIAGNOSIS — M7062 Trochanteric bursitis, left hip: Secondary | ICD-10-CM

## 2023-06-29 DIAGNOSIS — M19042 Primary osteoarthritis, left hand: Secondary | ICD-10-CM

## 2023-06-29 DIAGNOSIS — Z8639 Personal history of other endocrine, nutritional and metabolic disease: Secondary | ICD-10-CM

## 2023-06-29 DIAGNOSIS — F99 Mental disorder, not otherwise specified: Secondary | ICD-10-CM

## 2023-06-29 NOTE — Patient Instructions (Signed)
 Iliotibial Band Syndrome Rehab Ask your health care provider which exercises are safe for you. Do exercises exactly as told by your provider and adjust them as told. It's normal to feel mild stretching, pulling, tightness, or discomfort as you do these exercises. Stop right away if you feel sudden pain or your pain gets a lot worse. Do not begin these exercises until told by your provider. Stretching and range-of-motion exercises These exercises warm up your muscles and joints. They also improve the movement and flexibility of your hip and pelvis. Quadriceps stretch, prone  Lie face down (prone) on a firm surface like a bed or padded floor. Bend your left / right knee. Reach back to hold your ankle or pant leg. If you can't reach your ankle or pant leg, use a belt looped around your foot and grab the belt instead. Gently pull your heel toward your butt. Your knee should not slide out to the side. You should feel a stretch in the front of your thigh and knee, also called the quadriceps. Hold this position for __________ seconds. Repeat __________ times. Complete this exercise __________ times a day. Iliotibial band stretch The iliotibial band is a strip of tissue that runs along the outside of your hip down to your knee. Lie on your side with your left / right leg on top. Bend both knees and grab your left / right ankle. Stretch out your bottom arm to help you balance. Slowly bring your top knee back so your thigh goes behind your back. Slowly lower your top leg toward the floor until you feel a gentle stretch on the outside of your left / right hip and thigh. If you don't feel a stretch and your knee won't go farther, place the heel of your other foot on top of your knee and pull your knee down toward the floor with your foot. Hold this position for __________ seconds. Repeat __________ times. Complete this exercise __________ times a day. Strengthening exercises These exercises build strength  and endurance in your hip and pelvis. Endurance means your muscles can keep working even when they're tired. Straight leg raises, side-lying This exercise strengthens the muscles that rotate the leg at the hip and move it away from your body. These muscles are called hip abductors. Lie on your side with your left / right leg on top. Lie so your head, shoulder, hip, and knee line up. You can bend your bottom knee to help you balance. Roll your hips slightly forward so they're stacked directly over each other. Your left / right knee should face forward. Tense the muscles in your outer thigh and hip. Lift your top leg 4-6 inches (10-15 cm) off the ground. Hold this position for __________ seconds. Slowly lower your leg back down to the starting position. Let your muscles fully relax before doing this exercise again. Repeat __________ times. Complete this exercise __________ times a day. Leg raises, prone This exercise strengthens the muscles that move the hips backward. These muscles are called hip extensors. Lie face down (prone) on your bed or a firm surface. You can put a pillow under your hips for comfort and to support your lower back. Bend your left / right knee so your foot points straight up toward the ceiling. Keep the other leg straight and behind you. Squeeze your butt muscles. Lift your left / right thigh off the firm surface. Do not let your back arch. Tense your thigh muscle as hard as you can without having  more knee pain. Hold this position for __________ seconds. Slowly lower your leg to the starting position. Allow your leg to relax all the way. Repeat __________ times. Complete this exercise __________ times a day. Hip hike  Stand sideways on a bottom step. Place your feet so that your left / right leg is on the step, and the other foot is hanging off the side. If you need support for balance, hold onto a railing or wall. Keep your knees straight and your abdomen square,  meaning your hips are level. Then, lift your left / right hip up toward the ceiling. Slowly let your leg that's hanging off the step lower towards the floor. Your foot should get closer to the ground. Do not lean or bend your knees during this movement. Repeat __________ times. Complete this exercise __________ times a day. This information is not intended to replace advice given to you by your health care provider. Make sure you discuss any questions you have with your health care provider. Document Revised: 07/10/2022 Document Reviewed: 07/10/2022 Elsevier Patient Education  2024 [employer].

## 2023-07-02 ENCOUNTER — Encounter: Payer: Self-pay | Admitting: Neurology

## 2023-07-07 ENCOUNTER — Other Ambulatory Visit: Payer: Self-pay

## 2023-07-12 ENCOUNTER — Ambulatory Visit: Payer: 59 | Admitting: Nurse Practitioner

## 2023-07-19 ENCOUNTER — Ambulatory Visit (INDEPENDENT_AMBULATORY_CARE_PROVIDER_SITE_OTHER): Payer: 59 | Admitting: Neurology

## 2023-07-19 ENCOUNTER — Encounter: Payer: Self-pay | Admitting: Neurology

## 2023-07-19 ENCOUNTER — Other Ambulatory Visit

## 2023-07-19 VITALS — BP 134/71 | HR 86 | Ht 63.0 in | Wt 157.0 lb

## 2023-07-19 DIAGNOSIS — R202 Paresthesia of skin: Secondary | ICD-10-CM

## 2023-07-19 NOTE — Progress Notes (Signed)
 [facility] Clinic Note - Initial Visit   Date: 07/19/2023   Melissa Harmon MRN: [id number] DOB: 1955/08/25   Dear Dr. Corliss Skains:  Thank you for your kind referral of Melissa Harmon for consultation of bilateral feet and hand paresthesias. Although her history is well known to you, please allow [place] to reiterate it for the purpose of our medical record. The patient was accompanied to the clinic by self.    Melissa Harmon is a 68 y.o. right-handed female with diabetes mellitus, GERD, fibromyalgia, hypothyroidism, and hyperlipidemia  presenting for evaluation of bilateral hand and feet paresthesias.   IMPRESSION/PLAN: Bilateral hand and feet paresthesias.  History of diabetes increases risk of neuropathy, however, exam is normal.   To be characterize the nature of her symptoms, proceed with NCS/EMG of the right arm and left leg.  Check vitamin B12, folate, and vitamin B1.  Consider skin biopsy going forward.   Further recommendations pending results.  ------------------------------------------------------------- History of present illness: Starting early in 2024, she began having burning, tingling, and numbness involving the toes.  Symptoms are constant and worse in the morning.  She has some relief with soaking her feet.  It is worse with prolonged sitting or standing.  She also has similar numbness/tingling involving the hands, worse on the right.  She has some relief with diclofenac ointment to her feet.  She has worsening symptoms with cold sensation.   She is retired from being a Academic librarian at [employer].  She lives alone and has three grown children.  She smokes half-pack daily and drinks 2-3 glasses wine about 3 days per week.    Out-side paper records, electronic medical record, and images have been reviewed where available and summarized as:  Lab Results  Component Value Date   HGBA1C 7.3 (A) 04/16/2023   Lab Results  Component Value Date    TSH 4.110 04/16/2023   Lab Results  Component Value Date   ESRSEDRATE 6 02/20/2022   POCTSEDRATE 9 09/28/2013    Past Medical History:  Diagnosis Date   Bronchitis    Carpal tunnel syndrome on both sides    Family history of breast cancer    Family history of colonic polyps    Family history of lung cancer    Family history of ovarian cancer    Family history of stomach cancer    Fibromyalgia    GERD (gastroesophageal reflux disease)    History of chronic bronchitis    History of Hashimoto thyroiditis    s/p  bilateral thryoidectomy 03/ 2001   Hyperlipidemia    Hypothyroidism, postsurgical followed by pcp   08-04-1999 s/p  bilateral thyroidectomy for multinodular goitar (per path hashimoto's thyroiditis)   Irritable bowel syndrome with constipation    MDD (major depressive disorder)    Pelvic pain    Right ovarian cyst    Type 2 diabetes mellitus (HCC)    followed by pcp  (08-24-2019 pt stated checks blood sugar dialy in am,  fasting-- 159-175)   Wears contact lenses    Wears dentures    upper    Past Surgical History:  Procedure Laterality Date   CESAREAN SECTION  1986   COLONOSCOPY  last one 05-25-2018   LAPAROSCOPIC UNILATERAL SALPINGO OOPHERECTOMY Bilateral 09/01/2019   Procedure: LAPAROSCOPIC BILATERAL SALPINGO OOPHORECTOMY;  Surgeon: Edwinna Areola, DO;  Location: [facility];  Service: Gynecology;  Laterality: Bilateral;   THYROIDECTOMY Bilateral 08-04-1999  dr Lurene Shadow  @[facility]   subtotal   UPPER GASTROINTESTINAL ENDOSCOPY       Medications:  Outpatient Encounter Medications as of 07/19/2023  Medication Sig   albuterol (PROAIR HFA) 108 (90 Base) MCG/ACT inhaler Inhale 2 puffs into the lungs every 4 (four) hours as needed for wheezing or shortness of breath.   albuterol (VENTOLIN HFA) 108 (90 Base) MCG/ACT inhaler Inhale 2 puffs into the lungs every 6 (six) hours as needed for wheezing.   ammonium lactate (AMLACTIN) 12 % lotion Apply 1  Application topically as needed for dry skin.   atorvastatin (LIPITOR) 40 MG tablet Take 1 tablet (40 mg total) by mouth daily.   cetirizine (ZYRTEC) 10 MG tablet Take 1 tablet by mouth daily.   docusate sodium (COLACE) 100 MG capsule Take 1 capsule (100 mg total) by mouth 2 (two) times daily as needed for mild constipation.   glimepiride (AMARYL) 1 MG tablet Take 1-2 tablets (1-2 mg total) by mouth daily before breakfast.   glucose blood test strip ONE TOUCH STRIPS. Inject into the skin twice daily E11.65   Lancet Devices (ONE TOUCH DELICA LANCING DEV) MISC Inject into the skin twice daily E11.65   levothyroxine (SYNTHROID) 175 MCG tablet Take 1 tablet (175 mcg total) by mouth daily.   Lidocaine 5 % CREA Apply topically as prescribed   LINZESS 145 MCG CAPS capsule Take 145 mcg by mouth daily.   pantoprazole (PROTONIX) 40 MG tablet Take 1 tablet (40 mg total) by mouth daily.   triamcinolone (KENALOG) 0.025 % ointment Apply 1 Application topically 2 (two) times daily.   [DISCONTINUED] omeprazole (PRILOSEC) 20 MG capsule Take 1 capsule (20 mg total) by mouth daily.   Facility-Administered Encounter Medications as of 07/19/2023  Medication   0.9 %  sodium chloride infusion    Allergies:  Allergies  Allergen Reactions   Gabapentin Other (See Comments)    Other Reaction(s): memory changes   Metformin And Related Other (See Comments)    GI symptoms; Constipation   Sulfa Antibiotics     Other Reaction(s): hives   Sulfamethoxazole Hives   Other Rash    Family History: Family History  Problem Relation Age of Onset   Diabetes Mother    Heart disease Mother    Stroke Mother    Colon cancer Father    Bipolar disorder Father    Colon polyps Father        unknown number   Ovarian cancer Sister 57       or other gynecologic cancer   Schizophrenia Brother    Heart disease Brother    Diverticulitis Maternal Aunt    Stomach cancer Maternal Grandmother        dx ?   Heart attack Maternal  Grandmother    Lung cancer Cousin        dx 33s (maternal first cousin)   Breast cancer Cousin        dx 60s/70s (maternal first cousin)   Heart disease Half-Brother    Colon polyps Other 25       >80 polyps (6th degree relative - mother's cousin's grandson)   Esophageal cancer Neg Hx    Rectal cancer Neg Hx     Social History: Social History   Tobacco Use   Smoking status: Every Day    Current packs/day: 0.25    Average packs/day: 0.3 packs/day for 25.0 years (6.3 ttl pk-yrs)    Types: Cigarettes    Passive exposure: Current   Smokeless tobacco: Never   Tobacco comments:  6 cig per day  Vaping Use   Vaping status: Never Used  Substance Use Topics   Alcohol use: Yes    Alcohol/week: 6.0 standard drinks of alcohol    Types: 6 Cans of beer per week    Comment: ocasional glass of wine   Drug use: Never   Social History   Social History Narrative   Social Hx:   Current living situation- lives in [place] alone   Born in [place] and raised in [place] by mom and dad   Siblings 3 living siblings but total 5 (pt is number the youngest)   Schooling- HS grad   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 Right Handed    Are you currently employed ? No    What is your current occupation? Retired    Do you live at home alone? Yes   Who lives with you?    What type of home do you live in: 1 story or 2 story? Lives in one story home         Vital Signs:  BP 134/71   Pulse 86   Ht 5\' 3"  (1.6 m)   Wt 157 lb (71.2 kg)   SpO2 96%   BMI 27.81 kg/m    Neurological Exam: MENTAL STATUS including orientation to time, place, person, recent and remote memory, attention span and concentration, language, and fund of knowledge is normal.  Speech is not dysarthric.  CRANIAL NERVES: II:  No visual field defects.      III-IV-VI: Pupils equal round and reactive to light.  Normal conjugate, extra-ocular eye movements in all directions of gaze.  No nystagmus.  No ptosis.   V:  Normal facial sensation.    VII:  Normal facial symmetry and movements.   VIII:  Normal hearing and vestibular function.   IX-X:  Normal palatal movement.   XI:  Normal shoulder shrug and head rotation.   XII:  Normal tongue strength and range of motion, no deviation or fasciculation.  MOTOR:  No atrophy, fasciculations or abnormal movements.  No pronator drift.   Upper Extremity:  Right  Left  Deltoid  5/5   5/5   Biceps  5/5   5/5   Triceps  5/5   5/5   Wrist extensors  5/5   5/5   Wrist flexors  5/5   5/5   Finger extensors  5/5   5/5   Finger flexors  5/5   5/5   Dorsal interossei  5/5   5/5   Abductor pollicis  5/5   5/5   Tone (Ashworth scale)  0  0   Lower Extremity:  Right  Left  Hip flexors  5/5   5/5   Hip extensors  5/5   5/5   Knee extensors  5/5   5/5   Dorsiflexors  5/5   5/5   Plantarflexors  5/5   5/5   Toe extensors  5/5   5/5   Toe flexors  5/5   5/5   Tone (Ashworth scale)  0  0   MSRs:  Right        Left brachioradialis 2+  2+  biceps 2+  2+  triceps 2+  2+  patellar 2+  2+  ankle jerk 2+  2+  Hoffman no  no  plantar response down  down   SENSORY:  Normal and symmetric perception of light touch, pinprick, vibration, and temperature.  Romberg's sign absent.    COORDINATION/GAIT: Normal finger-to- nose-finger..  Intact rapid alternating movements bilaterally.  Gait narrow based and stable. Tandem and stressed gait intact.    Thank you for allowing me to participate in patient's care.  If I can answer any additional questions, I would be pleased to do so.    Sincerely,    Unity Luepke K. Allena Katz, DO

## 2023-07-19 NOTE — Patient Instructions (Addendum)
 Nerve testing right arm and left leg  Check labs  ELECTROMYOGRAM AND NERVE CONDUCTION STUDIES (EMG/NCS) INSTRUCTIONS  How to Prepare The neurologist conducting the EMG will need to know if you have certain medical conditions. Tell the neurologist and other EMG lab personnel if you: Have a pacemaker or any other electrical medical device Take blood-thinning medications Have hemophilia, a blood-clotting disorder that causes prolonged bleeding Bathing Take a shower or bath shortly before your exam in order to remove oils from your skin. Don't apply lotions or creams before the exam.  What to Expect You'll likely be asked to change into a hospital gown for the procedure and lie down on an examination table. The following explanations can help you understand what will happen during the exam.  Electrodes. The neurologist or a technician places surface electrodes at various locations on your skin depending on where you're experiencing symptoms. Or the neurologist may insert needle electrodes at different sites depending on your symptoms.  Sensations. The electrodes will at times transmit a tiny electrical current that you may feel as a twinge or spasm. The needle electrode may cause discomfort or pain that usually ends shortly after the needle is removed. If you are concerned about discomfort or pain, you may want to talk to the neurologist about taking a short break during the exam.  Instructions. During the needle EMG, the neurologist will assess whether there is any spontaneous electrical activity when the muscle is at rest - activity that isn't present in healthy muscle tissue - and the degree of activity when you slightly contract the muscle.  He or she will give you instructions on resting and contracting a muscle at appropriate times. Depending on what muscles and nerves the neurologist is examining, he or she may ask you to change positions during the exam.  After your EMG You may experience  some temporary, minor bruising where the needle electrode was inserted into your muscle. This bruising should fade within several days. If it persists, contact your primary care doctor.

## 2023-07-22 LAB — B12 AND FOLATE PANEL
Folate: 8 ng/mL
Vitamin B-12: 751 pg/mL (ref 200–1100)

## 2023-07-22 LAB — VITAMIN B1: Vitamin B1 (Thiamine): <6 nmol/L — ABNORMAL LOW (ref 8–30)

## 2023-07-26 ENCOUNTER — Other Ambulatory Visit: Payer: Self-pay | Admitting: Nurse Practitioner

## 2023-07-26 ENCOUNTER — Other Ambulatory Visit: Payer: Self-pay | Admitting: Podiatry

## 2023-07-26 DIAGNOSIS — E039 Hypothyroidism, unspecified: Secondary | ICD-10-CM

## 2023-07-27 ENCOUNTER — Ambulatory Visit: Admission: EM | Admit: 2023-07-27 | Discharge: 2023-07-27 | Discharge status: Against Medical Advice

## 2023-07-27 MED ORDER — AMMONIUM LACTATE 12 % EX LOTN: 1.0000 | TOPICAL_LOTION | CUTANEOUS | 0 refills | Status: DC | PRN

## 2023-07-29 ENCOUNTER — Telehealth: Payer: Self-pay | Admitting: Neurology

## 2023-07-29 NOTE — Telephone Encounter (Signed)
 Called and left message that she can find it at any Drug store, [facility] , or on line , if any more questions please call back.

## 2023-07-29 NOTE — Telephone Encounter (Signed)
 Pt. Was told to try Vitamin B1, but can't seem to find it over the counter, can nurse refer her to a pharmacy or store

## 2023-08-12 ENCOUNTER — Ambulatory Visit (INDEPENDENT_AMBULATORY_CARE_PROVIDER_SITE_OTHER): Admitting: Neurology

## 2023-08-12 DIAGNOSIS — R202 Paresthesia of skin: Secondary | ICD-10-CM | POA: Diagnosis not present

## 2023-08-12 DIAGNOSIS — G5601 Carpal tunnel syndrome, right upper limb: Secondary | ICD-10-CM

## 2023-08-12 NOTE — Procedures (Signed)
 [facility]  [address] [place], [address]  [place], [place] [address] Tel: [phone] Fax:  [phone] Test Date:  08/12/2023  Patient: Melissa Harmon DOB: 03/26/1956 Physician: Roxana Hires Synthia Fairbank,DO  Sex: Female Height: 5\' 3"  Ref Phys: Nita Sickle, DO  ID#: [id number]   Technician:    Patient Complaints: This is a 68 year old female referred for evaluation of right hand paresthesias and left leg paresthesias.  NCV & EMG Findings: Extensive electrodiagnostic testing of the right upper extremity left lower extremity shows:  Right mixed palmar sensory responses show prolonged latency.  Right median and ulnar sensory responses are within normal limits.  Left sural and superficial peroneal sensory responses are within normal limits. Right median, right ulnar, left peroneal, and left tibial motor responses are within normal limits. Left tibial H reflex study is within normal limits. There is no evidence of active or chronic motor axonal loss changes affecting any of the tested muscles.  Motor unit configuration and recruitment pattern is within normal limits.  Impression: Right median neuropathy at or distal to the wrist, consistent with a clinical diagnosis of carpal tunnel syndrome.  Overall, these findings are very mild in degree electrically. There is no evidence of a large fiber sensorimotor polyneuropathy or cervical/lumbosacral radiculopathy affecting the right upper extremity or left lower extremity.   ___________________________ Nita Sickle    Nerve Conduction Studies Anti Sensory Summary Table   Stim Site NR Peak (ms) Norm Peak (ms) O-P Amp (V) Norm O-P Amp  Right Median Anti Sensory (2nd Digit)  32C  Wrist    3.6 <3.8 25.0 >10  Left Sup Peroneal Anti Sensory (Ant Lat Mall)  32C  12 cm    2.6 <4.6 13.4 >3  Left Sural Anti Sensory (Lat Mall)  32C  Calf    2.8 <4.6 10.2 >3  Right Ulnar Anti Sensory (5th Digit)  32C  Wrist    2.5 <3.2 37.9 >5   Motor Summary  Table   Stim Site NR Onset (ms) Norm Onset (ms) O-P Amp (mV) Norm O-P Amp Site1 Site2 Delta-0 (ms) Dist (cm) Vel (m/s) Norm Vel (m/s)  Right Median Motor (Abd Poll Brev)  32C  Wrist    3.5 <4.0 10.2 >5 Elbow Wrist 5.4 29.0 54 >50  Elbow    8.9  9.0         Left Peroneal Motor (Ext Dig Brev)  32C  Ankle    3.2 <6.0 6.0 >2.5 B Fib Ankle 7.7 35.0 45 >40  B Fib    10.9  5.3  Poplt B Fib 1.4 8.0 57 >40  Poplt    12.3  5.2         Left Tibial Motor (Abd Hall Brev)  32C  Ankle    3.0 <6.0 6.3 >4 Knee Ankle 8.6 40.0 47 >40  Knee    11.6  4.6         Right Ulnar Motor (Abd Dig Minimi)  32C  Wrist    2.0 <3.1 9.3 >7 B Elbow Wrist 3.6 22.0 61 >50  B Elbow    5.6  9.2  A Elbow B Elbow 1.7 10.0 59 >50  A Elbow    7.3  9.2          Comparison Summary Table   Stim Site NR Peak (ms) Norm Peak (ms) P-T Amp (V) Site1 Site2 Delta-P (ms) Norm Delta (ms)  Right Median/Ulnar Palm Comparison (Wrist - 8cm)  32C  Median Palm    2.3 <  2.2 33.1 Median Palm Ulnar Palm 1.1   Ulnar Palm    1.2 <2.2 23.4       H Reflex Studies   NR H-Lat (ms) Lat Norm (ms) L-R H-Lat (ms)  Left Tibial (Gastroc)  32C     32.65 <35    EMG   Side Muscle Ins Act Fibs Fasc Recrt Dur. Amp. Poly. Activation Comment  Right 1stDorInt Nml Nml Nml Nml Nml Nml Nml Nml N/A  Right Abd Poll Brev Nml Nml Nml Nml Nml Nml Nml Nml N/A  Right PronatorTeres Nml Nml Nml Nml Nml Nml Nml Nml N/A  Right Biceps Nml Nml Nml Nml Nml Nml Nml Nml N/A  Right Triceps Nml Nml Nml Nml Nml Nml Nml Nml N/A  Right Deltoid Nml Nml Nml Nml Nml Nml Nml Nml N/A  Left AntTibialis Nml Nml Nml Nml Nml Nml Nml Nml N/A  Left Flex Dig Long Nml Nml Nml Nml Nml Nml Nml Nml N/A  Left Gastroc Nml Nml Nml Nml Nml Nml Nml Nml N/A  Left RectFemoris Nml Nml Nml Nml Nml Nml Nml Nml N/A  Left GluteusMed Nml Nml Nml Nml Nml Nml Nml Nml N/A      Waveforms:

## 2023-08-13 ENCOUNTER — Ambulatory Visit: Attending: Nurse Practitioner | Admitting: Nurse Practitioner

## 2023-08-13 ENCOUNTER — Encounter: Payer: Self-pay | Admitting: Nurse Practitioner

## 2023-08-13 VITALS — BP 113/75 | HR 74 | Temp 98.2°F | Ht 63.0 in | Wt 156.0 lb

## 2023-08-13 DIAGNOSIS — Z7984 Long term (current) use of oral hypoglycemic drugs: Secondary | ICD-10-CM

## 2023-08-13 DIAGNOSIS — M25552 Pain in left hip: Secondary | ICD-10-CM | POA: Diagnosis not present

## 2023-08-13 DIAGNOSIS — E119 Type 2 diabetes mellitus without complications: Secondary | ICD-10-CM | POA: Diagnosis not present

## 2023-08-13 DIAGNOSIS — R7989 Other specified abnormal findings of blood chemistry: Secondary | ICD-10-CM | POA: Diagnosis not present

## 2023-08-13 MED ORDER — CELECOXIB 100 MG PO CAPS
100.0000 mg | ORAL_CAPSULE | Freq: Two times a day (BID) | ORAL | 0 refills | Status: DC
Start: 2023-08-13 — End: 2023-09-13

## 2023-08-13 NOTE — Progress Notes (Signed)
 Assessment & Plan:  [name] was seen today for hip pain.  Diagnoses and all orders for this visit:  Left hip pain -     Ambulatory referral to Physical Therapy -     celecoxib (CELEBREX) 100 MG capsule; Take 1 capsule (100 mg total) by mouth 2 (two) times daily. For hip pain May alternate with heat and ice application for pain relief. May also alternate with acetaminophen as prescribed pain relief. Other alternatives include massage, acupuncture and water aerobics.     Diabetes mellitus treated with oral medication (HCC) -     Hemoglobin A1c Continue blood sugar control as discussed in office today, low carbohydrate diet, and regular physical exercise as tolerated, 150 minutes per week (30 min each day, 5 days per week, or 50 min 3 days per week). Keep blood sugar logs with fasting goal of 90-130 mg/dl, post prandial (after you eat) less than 180.  For Hypoglycemia: BS <60 and Hyperglycemia BS >400; contact the clinic ASAP. Annual eye exams and foot exams are recommended.   Elevated LFTs -     Hepatic Function Panel    Patient has been counseled on age-appropriate routine health concerns for screening and prevention. These are reviewed and up-to-date. Referrals have been placed accordingly. Immunizations are up-to-date or declined.    Subjective:   Chief Complaint  Patient presents with   Hip Pain    L hip pain x2 mo - worsening     Melissa Harmon 68 y.o. female presents to office today with complaints of left hip pain x2 months.   She has a history of IBS, Hypothyroidism, DM,  HTN, OA, Fibromyalgia    She has trochanteric bursitis and was is currently being followed by rheumatology. .  Does not feel current IT band stretch exercises are effective. She has not reached out to rheumatology regarding her lack of pain control.      Review of Systems  Constitutional:  Negative for fever, malaise/fatigue and weight loss.  HENT: Negative.  Negative for nosebleeds.   Eyes:  Negative.  Negative for blurred vision, double vision and photophobia.  Respiratory: Negative.  Negative for cough and shortness of breath.   Cardiovascular: Negative.  Negative for chest pain, palpitations and leg swelling.  Gastrointestinal: Negative.  Negative for heartburn, nausea and vomiting.  Musculoskeletal:  Positive for joint pain. Negative for myalgias.  Neurological: Negative.  Negative for dizziness, focal weakness, seizures and headaches.  Psychiatric/Behavioral: Negative.  Negative for suicidal ideas.     Past Medical History:  Diagnosis Date   Bronchitis    Carpal tunnel syndrome on both sides    Family history of breast cancer    Family history of colonic polyps    Family history of lung cancer    Family history of ovarian cancer    Family history of stomach cancer    Fibromyalgia    GERD (gastroesophageal reflux disease)    History of chronic bronchitis    History of Hashimoto thyroiditis    s/p  bilateral thryoidectomy 03/ 2001   Hyperlipidemia    Hypothyroidism, postsurgical followed by pcp   08-04-1999 s/p  bilateral thyroidectomy for multinodular goitar (per path hashimoto's thyroiditis)   Irritable bowel syndrome with constipation    MDD (major depressive disorder)    Pelvic pain    Right ovarian cyst    Type 2 diabetes mellitus (HCC)    followed by pcp  (08-24-2019 pt stated checks blood sugar dialy in am,  fasting--  159-175)   Wears contact lenses    Wears dentures    upper    Past Surgical History:  Procedure Laterality Date   CESAREAN SECTION  1986   COLONOSCOPY  last one 05-25-2018   LAPAROSCOPIC UNILATERAL SALPINGO OOPHERECTOMY Bilateral 09/01/2019   Procedure: LAPAROSCOPIC BILATERAL SALPINGO OOPHORECTOMY;  Surgeon: Edwinna Areola, DO;  Location: [facility];  Service: Gynecology;  Laterality: Bilateral;   THYROIDECTOMY Bilateral 08-04-1999  dr Lurene Shadow  @[facility]    subtotal   UPPER GASTROINTESTINAL ENDOSCOPY      Family  History  Problem Relation Age of Onset   Diabetes Mother    Heart disease Mother    Stroke Mother    Colon cancer Father    Bipolar disorder Father    Colon polyps Father        unknown number   Ovarian cancer Sister 106       or other gynecologic cancer   Schizophrenia Brother    Heart disease Brother    Diverticulitis Maternal Aunt    Stomach cancer Maternal Grandmother        dx ?   Heart attack Maternal Grandmother    Lung cancer Cousin        dx 39s (maternal first cousin)   Breast cancer Cousin        dx 60s/70s (maternal first cousin)   Heart disease Half-Brother    Colon polyps Other 25       >80 polyps (6th degree relative - mother's cousin's grandson)   Esophageal cancer Neg Hx    Rectal cancer Neg Hx     Social History Reviewed with no changes to be made today.   Outpatient Medications Prior to Visit  Medication Sig Dispense Refill   albuterol (PROAIR HFA) 108 (90 Base) MCG/ACT inhaler Inhale 2 puffs into the lungs every 4 (four) hours as needed for wheezing or shortness of breath. 18 g 0   albuterol (VENTOLIN HFA) 108 (90 Base) MCG/ACT inhaler Inhale 2 puffs into the lungs every 6 (six) hours as needed for wheezing. 18 g 1   ammonium lactate (AMLACTIN) 12 % lotion Apply 1 Application topically as needed for dry skin. 400 g 0   atorvastatin (LIPITOR) 40 MG tablet Take 1 tablet (40 mg total) by mouth daily. 90 tablet 3   cetirizine (ZYRTEC) 10 MG tablet Take 1 tablet by mouth daily.     docusate sodium (COLACE) 100 MG capsule Take 1 capsule (100 mg total) by mouth 2 (two) times daily as needed for mild constipation. 10 capsule 1   glimepiride (AMARYL) 1 MG tablet Take 1-2 tablets (1-2 mg total) by mouth daily before breakfast. 90 tablet 2   glucose blood test strip ONE TOUCH STRIPS. Inject into the skin twice daily E11.65 100 each 6   Lancet Devices (ONE TOUCH DELICA LANCING DEV) MISC Inject into the skin twice daily E11.65 1 each prn   levothyroxine (SYNTHROID) 175  MCG tablet Take 1 tablet (175 mcg total) by mouth daily. 30 tablet 0   Lidocaine 5 % CREA Apply topically as prescribed 30 g 1   LINZESS 145 MCG CAPS capsule Take 145 mcg by mouth daily.     pantoprazole (PROTONIX) 40 MG tablet Take 1 tablet (40 mg total) by mouth daily. 90 tablet 1   triamcinolone (KENALOG) 0.025 % ointment Apply 1 Application topically 2 (two) times daily. 60 g 1   Facility-Administered Medications Prior to Visit  Medication Dose Route Frequency Provider Last Rate  Last Admin   0.9 %  sodium chloride infusion  500 mL Intravenous Once Nandigam, Eleonore Chiquito, MD        Allergies  Allergen Reactions   Gabapentin Other (See Comments)    Other Reaction(s): memory changes   Metformin And Related Other (See Comments)    GI symptoms; Constipation   Sulfa Antibiotics     Other Reaction(s): hives   Sulfamethoxazole Hives   Other Rash       Objective:    BP 113/75 (BP Location: Left Arm, Patient Position: Sitting)   Pulse 74   Temp 98.2 F (36.8 C) (Oral)   Ht 5\' 3"  (1.6 m)   Wt 156 lb (70.8 kg)   SpO2 98%   BMI 27.63 kg/m  Wt Readings from Last 3 Encounters:  08/13/23 156 lb (70.8 kg)  07/19/23 157 lb (71.2 kg)  06/29/23 158 lb 12.8 oz (72 kg)    Physical Exam Vitals and nursing note reviewed.  Constitutional:      Appearance: She is well-developed.  HENT:     Head: Normocephalic and atraumatic.  Cardiovascular:     Rate and Rhythm: Normal rate and regular rhythm.     Heart sounds: Normal heart sounds. No murmur heard.    No friction rub. No gallop.  Pulmonary:     Effort: Pulmonary effort is normal. No tachypnea or respiratory distress.     Breath sounds: Normal breath sounds. No decreased breath sounds, wheezing, rhonchi or rales.  Chest:     Chest wall: No tenderness.  Abdominal:     General: Bowel sounds are normal.     Palpations: Abdomen is soft.  Musculoskeletal:        General: Normal range of motion.     Cervical back: Normal range of motion.   Skin:    General: Skin is warm and dry.  Neurological:     Mental Status: She is alert and oriented to person, place, and time.     Coordination: Coordination normal.  Psychiatric:        Behavior: Behavior normal. Behavior is cooperative.        Thought Content: Thought content normal.        Judgment: Judgment normal.          Patient has been counseled extensively about nutrition and exercise as well as the importance of adherence with medications and regular follow-up. The patient was given clear instructions to go to ER or return to medical center if symptoms don't improve, worsen or new problems develop. The patient verbalized understanding.   Follow-up: Return if symptoms worsen or fail to improve.   Claiborne Rigg, FNP-BC [facility] [place], [place] [phone]   08/16/2023, 10:17 PM

## 2023-08-14 LAB — HEPATIC FUNCTION PANEL
ALT: 70 IU/L — ABNORMAL HIGH (ref 0–32)
AST: 49 IU/L — ABNORMAL HIGH (ref 0–40)
Albumin: 4.2 g/dL (ref 3.9–4.9)
Alkaline Phosphatase: 178 IU/L — ABNORMAL HIGH (ref 44–121)
Bilirubin Total: 0.3 mg/dL (ref 0.0–1.2)
Bilirubin, Direct: 0.12 mg/dL (ref 0.00–0.40)
Total Protein: 7.1 g/dL (ref 6.0–8.5)

## 2023-08-14 LAB — HEMOGLOBIN A1C
Est. average glucose Bld gHb Est-mCnc: 197 mg/dL
Hgb A1c MFr Bld: 8.5 % — ABNORMAL HIGH (ref 4.8–5.6)

## 2023-08-16 ENCOUNTER — Other Ambulatory Visit: Payer: Self-pay | Admitting: Nurse Practitioner

## 2023-08-16 ENCOUNTER — Encounter: Payer: Self-pay | Admitting: Nurse Practitioner

## 2023-08-16 MED ORDER — EMPAGLIFLOZIN 10 MG PO TABS: 10.0000 mg | ORAL_TABLET | Freq: Every day | ORAL | 1 refills | Status: DC

## 2023-08-17 ENCOUNTER — Telehealth: Payer: Self-pay | Admitting: Neurology

## 2023-08-17 MED ORDER — FOLIC ACID 1 MG PO TABS: 1.0000 mg | ORAL_TABLET | Freq: Every day | ORAL | 5 refills | Status: AC

## 2023-08-17 NOTE — Telephone Encounter (Signed)
 Pt called the AN and LM. She needs to take 1mg  folic acid and she has went to every drug store and can't find any

## 2023-08-17 NOTE — Telephone Encounter (Signed)
 Called patient and informed her that Dr. Allena Katz went ahead and sent in prescription for her folic acid. Patient thanked Dr. Allena Katz for that and will get that picked up.   Patient aware to call us if she has any more issues or concerns.

## 2023-08-17 NOTE — Telephone Encounter (Signed)
 Called patient and she stated that she went to [facility] and drug stores and they informed they do not carry the 1 mg of Folic acid and that it needs to be prescribed.

## 2023-08-25 ENCOUNTER — Encounter: Payer: Self-pay | Admitting: Physical Therapy

## 2023-08-25 ENCOUNTER — Other Ambulatory Visit: Payer: Self-pay

## 2023-08-25 ENCOUNTER — Ambulatory Visit: Attending: Nurse Practitioner | Admitting: Physical Therapy

## 2023-08-25 DIAGNOSIS — M25652 Stiffness of left hip, not elsewhere classified: Secondary | ICD-10-CM | POA: Diagnosis present

## 2023-08-25 DIAGNOSIS — M25552 Pain in left hip: Secondary | ICD-10-CM | POA: Diagnosis present

## 2023-08-25 DIAGNOSIS — M7989 Other specified soft tissue disorders: Secondary | ICD-10-CM | POA: Insufficient documentation

## 2023-08-25 NOTE — Therapy (Signed)
 OUTPATIENT PHYSICAL THERAPY LOWER EXTREMITY EVALUATION   Patient Name: Melissa Harmon MRN: [id number] DOB:29-Sep-1955, 68 y.o., female Today's Date: 08/25/2023  END OF SESSION:  PT End of Session - 08/25/23 0908     Visit Number 1    Number of Visits 7    Date for PT Re-Evaluation 10/06/23    Authorization Type [employer]    PT Start Time 0830    PT Stop Time 0908    PT Time Calculation (min) 38 min    Activity Tolerance Patient tolerated treatment well    Behavior During Therapy WFL for tasks assessed/performed             Past Medical History:  Diagnosis Date   Bronchitis    Carpal tunnel syndrome on both sides    Family history of breast cancer    Family history of colonic polyps    Family history of lung cancer    Family history of ovarian cancer    Family history of stomach cancer    Fibromyalgia    GERD (gastroesophageal reflux disease)    History of chronic bronchitis    History of Hashimoto thyroiditis    s/p  bilateral thryoidectomy 03/ 2001   Hyperlipidemia    Hypothyroidism, postsurgical followed by pcp   08-04-1999 s/p  bilateral thyroidectomy for multinodular goitar (per path hashimoto's thyroiditis)   Irritable bowel syndrome with constipation    MDD (major depressive disorder)    Pelvic pain    Right ovarian cyst    Type 2 diabetes mellitus (HCC)    followed by pcp  (08-24-2019 pt stated checks blood sugar dialy in am,  fasting-- 159-175)   Wears contact lenses    Wears dentures    upper   Past Surgical History:  Procedure Laterality Date   CESAREAN SECTION  1986   COLONOSCOPY  last one 05-25-2018   LAPAROSCOPIC UNILATERAL SALPINGO OOPHERECTOMY Bilateral 09/01/2019   Procedure: LAPAROSCOPIC BILATERAL SALPINGO OOPHORECTOMY;  Surgeon: Edwinna Areola, DO;  Location: [facility];  Service: Gynecology;  Laterality: Bilateral;   THYROIDECTOMY Bilateral 08-04-1999  dr Lurene Shadow  @[facility]    subtotal   UPPER GASTROINTESTINAL ENDOSCOPY      Patient Active Problem List   Diagnosis Date Noted   Pain due to onychomycosis of toenails of both feet 05/15/2022   Anxiety 11/26/2021   Atopic dermatitis 11/26/2021   Irritable bowel syndrome with constipation 11/26/2021   Type 2 diabetes mellitus with hyperglycemia (HCC) 11/26/2021   Unspecified condition associated with female genital organs and menstrual cycle 11/26/2021   Diabetes mellitus with neuropathy (HCC) 06/07/2020   Diabetic neuropathy (HCC) 06/07/2020   Genetic testing 05/22/2020   Family history of colonic polyps    Family history of ovarian cancer    Family history of stomach cancer    Family history of lung cancer    Family history of breast cancer    Adjustment disorder with anxious mood 09/18/2017   Insomnia due to other mental disorder 09/18/2017   DYSURIA 10/25/2009   ABDOMINAL PAIN, RIGHT UPPER QUADRANT 10/25/2009   NECK PAIN, LEFT 08/23/2009   HAND PAIN, RIGHT 08/23/2009   ELBOW PAIN, BILATERAL 05/17/2009   Tobacco user 05/13/2009   DIABETES MELLITUS, TYPE II, WITHOUT COMPLICATIONS 12/14/2008   DYSLIPIDEMIA 12/14/2008   SHORTNESS OF BREATH 12/14/2008   RECTAL BLEEDING 04/22/2007   History of colonic polyps 04/22/2007   BLOOD IN STOOL 04/15/2007   PITYRIASIS ROSEA 08/09/2006   HYPOTHYROIDISM, UNSPECIFIED 07/08/2006   Esophageal reflux  07/08/2006   FIBROMYALGIA, FIBROMYOSITIS 07/08/2006    PCP: Collins Dean, NP  REFERRING PROVIDER: Collins Dean, NP  REFERRING DIAG: Left hip pain [M25.552]   Rationale for Evaluation and Treatment: Rehabilitation  THERAPY DIAG:  Pain in left hip  Stiffness of left hip, not elsewhere classified  Other specified soft tissue disorders  PERTINENT HISTORY: Hypothyroidism, DM2  WEIGHT BEARING RESTRICTIONS: No  FALLS:  Has patient fallen in last 6 months? No  LIVING ENVIRONMENT: Lives with: lives alone Lives in: House/apartment Stairs: No Has following equipment at home: None  OCCUPATION:  retired   PRECAUTIONS: None ---------------------------------------------------------------------------------------------  SUBJECTIVE:   SUBJECTIVE STATEMENT: Eval statement 08/25/2023: has been having pain in the L hip for the past 7 months, happens most over night and early. 8/10 in the morning. Moving around helps pain reduce, standing or sitting too long, around 30 minutes can increase pain. Occasionally n/t symptoms down L leg  RED FLAGS: None   PLOF: Independent  PATIENT GOALS: help pain in L hip  NEXT MD VISIT: 3 months ---------------------------------------------------------------------------------------------  OBJECTIVE:  Note: Objective measures were completed at Evaluation unless otherwise noted.  DIAGNOSTIC FINDINGS: no recent imaging on L hip  PATIENT SURVEYS:  LEFS 34/80   COGNITION: Overall cognitive status: Within functional limits for tasks assessed     SENSATION: WFL  EDEMA:    MUSCLE LENGTH: Hamstrings: B limited (potential guarding) Thomas test: B limited w/ pain in anterior hip   POSTURE: rounded shoulders and forward head  PALPATION: Tenderness in L piriformis (reproduced symptoms)  LOWER EXTREMITY ROM:  Passive ROM Right eval Left eval  Hip flexion WFL! WFL!  Hip extension [facility]!  Hip abduction    Hip adduction    Hip internal rotation Limited! Limited!  Hip external rotation [facility]  Knee flexion    Knee extension    Ankle dorsiflexion    Ankle plantarflexion    Ankle inversion    Ankle eversion     (Blank rows = not tested)  ! Indicates pain with testing  LOWER EXTREMITY MMT:  MMT Right eval Left eval  Hip flexion 3+ 3+  Hip extension 4- 4-  Hip abduction 4- 4-  Hip adduction    Hip internal rotation    Hip external rotation    Knee flexion 4 4  Knee extension 4 4  Ankle dorsiflexion    Ankle plantarflexion    Ankle inversion    Ankle eversion     (Blank rows = not tested)  ! Indicates pain with  testing  LOWER EXTREMITY SPECIAL TESTS:  Hip special tests: [name] (FABER) test: positive  and Piriformis test: positive  Slump: +  GAIT: Distance walked: 180ft Assistive device utilized: None Level of assistance: Complete Independence Comments: antalgic limp when initiating gait on L side  [facility] Adult PT Treatment:                                                DATE: 08/25/2023  Self Care: Pt education, detailed below POC discussion    PATIENT EDUCATION:  Education details: Pt received education regarding HEP performance, ADL performance, functional activity tolerance, impairment education, appropriate performance of therapeutic activities. Person educated: Patient Education method: Explanation, Demonstration, Tactile cues, Verbal cues, and Handouts Education comprehension: verbalized understanding and returned demonstration  HOME EXERCISE PROGRAM: Access Code: [email or web address] URL: [email or web address] Date: 08/25/2023 Prepared by: Albesa Huguenin  Exercises - Piriformis Mobilization with Small Ball  - 2-3 x daily - 5 x weekly - 1 sets - 1 reps - 15m hold - Supine piriformis stretch with knee extension  - 1 x daily - 7 x weekly - 2 sets - 20 reps - 1 hold - Supine Piriformis Stretch with Foot on Ground  - 1 x daily - 7 x weekly - 2 sets - 1 reps - 63m hold - Supine Bridge with Resistance Band  - 1 x daily - 7 x weekly - 3 sets - 8 reps - 3s hold - Clamshell with Resistance  - 1 x daily - 7 x weekly - 3 sets - 8 reps - 2s hold ---------------------------------------------------------------------------------------------  ASSESSMENT:  CLINICAL IMPRESSION: Eval impression (08/25/2023): Pt. attended today's physical therapy session for evaluation of L hip pain. Pt has complaints of 8/10 L hip pain onset 7 months with occasionally n/t symptoms down l  leg, prolonged sitting and standing make symptoms worse. Pt has notable deficits with Hip internal ROM, global hip weakness, and increased resting tone of piriformis.  Signs and symptoms are concurrent with piriformis syndrome. Pt would benefit from therapeutic focus on piriformis motility, global hip strengthening/stability, and sciatic nerve motility.  Treatment performed today focused on pt education, detailed in obj. Pt demonstrated great understanding of education provided. required minimal verbal/tactile cues and no assistance for appropriate performance with today's activities. Pt requires the intervention of skilled outpatient physical therapy to address the aforementioned deficits and progress towards a functional level in line with therapeutic goals.   OBJECTIVE IMPAIRMENTS: Abnormal gait, decreased activity tolerance, decreased mobility, difficulty walking, decreased ROM, decreased strength, increased fascial restrictions, improper body mechanics, and pain.   ACTIVITY LIMITATIONS: carrying, bending, sitting, standing, squatting, and locomotion level  PARTICIPATION LIMITATIONS: interpersonal relationship, driving, and community activity  PERSONAL FACTORS: Past/current experiences, Time since onset of injury/illness/exacerbation, and 1-2 comorbidities: DM2, hypothyroidism  are also affecting patient's functional outcome.   REHAB POTENTIAL: Good  CLINICAL DECISION MAKING: Stable/uncomplicated  EVALUATION COMPLEXITY: Low   GOALS: Goals reviewed with patient? YES  SHORT TERM GOALS: Target date: 09/15/2023   Pt will be independent with administered HEP to demonstrate the competency necessary for long term managemnet of symptoms at home.  Baseline: Goal status: INITIAL   LONG TERM GOALS: Target date: 10/06/2023    Pt. Will achieve a LEFS score of 50/80 as to demonstrate improvement in self-perceived functional ability with daily activities.  Baseline: 34/80 Goal status:  INITIAL  2.  Pt will improve Global Hip/knee strength to a 4+/5 to demonstrate improvement in strength for quality of motion and activity performance.  Baseline: see obj chart Goal status: INITIAL  3.  Pt will report pain levels improving during ADLs to be less than or  equal to 2/10 as to demonstrate improved tolerance with daily functional activities such as dressing and walking around the house.  Baseline:  Goal status: INITIAL  4.  Pt will report the ability to stand for >/= 60 minutes as to demonstrate improved tolerance to standing for prolonged time and improved ability to participate in community activities such as shopping.  Baseline:  Goal status: INITIAL --------------------------------------------------------------------------------------------- PLAN:  PT FREQUENCY: 1-2x/week  PT DURATION: 6 weeks  PLANNED INTERVENTIONS: 97110-Therapeutic exercises, 97530- Therapeutic activity, 97112- Neuromuscular re-education, 97535- Self Care, 16109- Manual therapy, [phone]- Gait training, Patient/Family education, Balance training, Stair training, Dry Needling, and Joint mobilization  PLAN FOR NEXT SESSION: Review HEP, Begin POC as detailed in the assessment   Albesa Huguenin, PT, DPT 08/25/2023, 9:09 AM

## 2023-09-03 NOTE — Therapy (Signed)
 OUTPATIENT PHYSICAL THERAPY TREATMENT NOTE   Patient Name: Melissa Harmon MRN: [id number] DOB:[birth year], 68 y.o., female Today's Date: 09/06/2023  END OF SESSION:  PT End of Session - 09/06/23 0855     Visit Number 2    Number of Visits 7    Date for PT Re-Evaluation 10/06/23    Authorization Type [employer]    PT Start Time 0915    PT Stop Time 0955    PT Time Calculation (min) 40 min    Activity Tolerance Patient tolerated treatment well    Behavior During Therapy [facility] for tasks assessed/performed              Past Medical History:  Diagnosis Date   Bronchitis    Carpal tunnel syndrome on both sides    Family history of breast cancer    Family history of colonic polyps    Family history of lung cancer    Family history of ovarian cancer    Family history of stomach cancer    Fibromyalgia    GERD (gastroesophageal reflux disease)    History of chronic bronchitis    History of Hashimoto thyroiditis    s/p  bilateral thryoidectomy 03/ 2001   Hyperlipidemia    Hypothyroidism, postsurgical followed by pcp   08-04-1999 s/p  bilateral thyroidectomy for multinodular goitar (per path hashimoto's thyroiditis)   Irritable bowel syndrome with constipation    MDD (major depressive disorder)    Pelvic pain    Right ovarian cyst    Type 2 diabetes mellitus (HCC)    followed by pcp  (08-24-2019 pt stated checks blood sugar dialy in am,  fasting-- 159-175)   Wears contact lenses    Wears dentures    upper   Past Surgical History:  Procedure Laterality Date   CESAREAN SECTION  1986   COLONOSCOPY  last one 05-25-2018   LAPAROSCOPIC UNILATERAL SALPINGO OOPHERECTOMY Bilateral 09/01/2019   Procedure: LAPAROSCOPIC BILATERAL SALPINGO OOPHORECTOMY;  Surgeon: Loa Riling, DO;  Location: [facility];  Service: Gynecology;  Laterality: Bilateral;   THYROIDECTOMY Bilateral 08-04-1999  dr Kristan Petit  @[facility]    subtotal   UPPER GASTROINTESTINAL ENDOSCOPY     Patient Active  Problem List   Diagnosis Date Noted   Pain due to onychomycosis of toenails of both feet 05/15/2022   Anxiety 11/26/2021   Atopic dermatitis 11/26/2021   Irritable bowel syndrome with constipation 11/26/2021   Type 2 diabetes mellitus with hyperglycemia (HCC) 11/26/2021   Unspecified condition associated with female genital organs and menstrual cycle 11/26/2021   Diabetes mellitus with neuropathy (HCC) 06/07/2020   Diabetic neuropathy (HCC) 06/07/2020   Genetic testing 05/22/2020   Family history of colonic polyps    Family history of ovarian cancer    Family history of stomach cancer    Family history of lung cancer    Family history of breast cancer    Adjustment disorder with anxious mood 09/18/2017   Insomnia due to other mental disorder 09/18/2017   DYSURIA 10/25/2009   ABDOMINAL PAIN, RIGHT UPPER QUADRANT 10/25/2009   NECK PAIN, LEFT 08/23/2009   HAND PAIN, RIGHT 08/23/2009   ELBOW PAIN, BILATERAL 05/17/2009   Tobacco user 05/13/2009   DIABETES MELLITUS, TYPE II, WITHOUT COMPLICATIONS 12/14/2008   DYSLIPIDEMIA 12/14/2008   SHORTNESS OF BREATH 12/14/2008   RECTAL BLEEDING 04/22/2007   History of colonic polyps 04/22/2007   BLOOD IN STOOL 04/15/2007   PITYRIASIS ROSEA 08/09/2006   HYPOTHYROIDISM, UNSPECIFIED 07/08/2006   Esophageal reflux  07/08/2006   FIBROMYALGIA, FIBROMYOSITIS 07/08/2006    PCP: Melissa Dean, NP  REFERRING PROVIDER: Collins Dean, NP  REFERRING DIAG: Left hip pain [M25.552]   Rationale for Evaluation and Treatment: Rehabilitation  THERAPY DIAG:  Pain in left hip  Stiffness of left hip, not elsewhere classified  PERTINENT HISTORY: Hypothyroidism, DM2  WEIGHT BEARING RESTRICTIONS: No  FALLS:  Has patient fallen in last 6 months? No  LIVING ENVIRONMENT: Lives with: lives alone Lives in: House/apartment Stairs: No Has following equipment at home: None  OCCUPATION: retired   PRECAUTIONS:  None ---------------------------------------------------------------------------------------------  SUBJECTIVE:   SUBJECTIVE STATEMENT: L hip pain a bit better, has been compliant with HEP  RED FLAGS: None   PLOF: Independent  PATIENT GOALS: help pain in L hip  NEXT MD VISIT: 3 months ---------------------------------------------------------------------------------------------  OBJECTIVE:  Note: Objective measures were completed at Evaluation unless otherwise noted.  DIAGNOSTIC FINDINGS: no recent imaging on L hip  PATIENT SURVEYS:  LEFS 34/80   COGNITION: Overall cognitive status: Within functional limits for tasks assessed     SENSATION: WFL  EDEMA:    MUSCLE LENGTH: Hamstrings: B limited (potential guarding) Thomas test: B limited w/ pain in anterior hip   POSTURE: rounded shoulders and forward head  PALPATION: Tenderness in L piriformis (reproduced symptoms)  LOWER EXTREMITY ROM:  Passive ROM Right eval Left eval  Hip flexion WFL! WFL!  Hip extension [facility]!  Hip abduction    Hip adduction    Hip internal rotation Limited! Limited!  Hip external rotation [facility]  Knee flexion    Knee extension    Ankle dorsiflexion    Ankle plantarflexion    Ankle inversion    Ankle eversion     (Blank rows = not tested)  ! Indicates pain with testing  LOWER EXTREMITY MMT:  MMT Right eval Left eval  Hip flexion 3+ 3+  Hip extension 4- 4-  Hip abduction 4- 4-  Hip adduction    Hip internal rotation    Hip external rotation    Knee flexion 4 4  Knee extension 4 4  Ankle dorsiflexion    Ankle plantarflexion    Ankle inversion    Ankle eversion     (Blank rows = not tested)  ! Indicates pain with testing  LOWER EXTREMITY SPECIAL TESTS:  Hip special tests: [name] (FABER) test: positive  and Piriformis test: positive  Slump: +  GAIT: Distance walked: 161ft Assistive device utilized: None Level of assistance: Complete Independence Comments:  antalgic limp when initiating gait on L side   [facility] Adult PT Treatment:                                                DATE: 09/06/23 Therapeutic Exercise: Nustep L2 8 min Neuromuscular re-ed: Supine hip fallouts RTB 15x B, 15/15 unilaterally S/L clams 15x B Bridge RTB 15x Bridge against ball 15x STS arms 10x  Therapeutic Activity: B hamstring stretch seated 30s x2 L piriformis stretch 30s x2 L hip flexor stretch 30s x2 Prone on elbows 2 min  [facility] Adult PT Treatment:                                                DATE: 09/06/2023  Self Care: Pt education, detailed below POC discussion    PATIENT EDUCATION:  Education details: Pt received education regarding HEP performance, ADL performance, functional activity tolerance, impairment education, appropriate performance of therapeutic activities. Person educated: Patient Education method: Explanation, Demonstration, Tactile cues, Verbal cues, and Handouts Education comprehension: verbalized understanding and returned demonstration  HOME EXERCISE PROGRAM: Access Code: [email or web address] URL: [email or web address] Date: 08/25/2023 Prepared by: Albesa Huguenin  Exercises - Piriformis Mobilization with Small Ball  - 2-3 x daily - 5 x weekly - 1 sets - 1 reps - 35m hold - Supine piriformis stretch with knee extension  - 1 x daily - 7 x weekly - 2 sets - 20 reps - 1 hold - Supine Piriformis Stretch with Foot on Ground  - 1 x daily - 7 x weekly - 2 sets - 1 reps - 59m hold - Supine Bridge with Resistance Band  - 1 x daily - 7 x weekly - 3 sets - 8 reps - 3s hold - Clamshell with Resistance  - 1 x daily - 7 x weekly - 3 sets - 8 reps - 2s hold ---------------------------------------------------------------------------------------------  ASSESSMENT:  CLINICAL IMPRESSION: Focus of first f/u session was aerobic w/u,  HEP review, stretching to L hip and strengthening and stabilizing exercises.  Patient able to complete all tasks w/o any aggravation of symptoms.  Eval impression (08/25/2023): Pt. attended today's physical therapy session for evaluation of L hip pain. Pt has complaints of 8/10 L hip pain onset 7 months with occasionally n/t symptoms down l leg, prolonged sitting and standing make symptoms worse. Pt has notable deficits with Hip internal ROM, global hip weakness, and increased resting tone of piriformis.  Signs and symptoms are concurrent with piriformis syndrome. Pt would benefit from therapeutic focus on piriformis motility, global hip strengthening/stability, and sciatic nerve motility.  Treatment performed today focused on pt education, detailed in obj. Pt demonstrated great understanding of education provided. required minimal verbal/tactile cues and no assistance for appropriate performance with today's activities. Pt requires the intervention of skilled outpatient physical therapy to address the aforementioned deficits and progress towards a functional level in line with therapeutic goals.   OBJECTIVE IMPAIRMENTS: Abnormal gait, decreased activity tolerance, decreased mobility, difficulty walking, decreased ROM, decreased strength, increased fascial restrictions, improper body mechanics, and pain.   ACTIVITY LIMITATIONS: carrying, bending, sitting, standing, squatting, and locomotion level  PARTICIPATION LIMITATIONS: interpersonal relationship, driving, and community activity  PERSONAL FACTORS: Past/current experiences, Time since onset of injury/illness/exacerbation, and 1-2 comorbidities: DM2, hypothyroidism  are also affecting patient's functional outcome.   REHAB POTENTIAL: Good  CLINICAL DECISION MAKING: Stable/uncomplicated  EVALUATION COMPLEXITY: Low   GOALS: Goals reviewed with patient? YES  SHORT TERM GOALS: Target date: 09/15/2023   Pt will be independent with administered  HEP to demonstrate the competency necessary for long term managemnet of symptoms at home.  Baseline: Goal status: INITIAL   LONG TERM GOALS: Target date: 10/06/2023    Pt. Will achieve a LEFS score of 50/80 as to demonstrate improvement in self-perceived functional ability with daily activities.  Baseline: 34/80 Goal status: INITIAL  2.  Pt will improve Global Hip/knee strength to a 4+/5 to demonstrate improvement in  strength for quality of motion and activity performance.  Baseline: see obj chart Goal status: INITIAL  3.  Pt will report pain levels improving during ADLs to be less than or equal to 2/10 as to demonstrate improved tolerance with daily functional activities such as dressing and walking around the house.  Baseline:  Goal status: INITIAL  4.  Pt will report the ability to stand for >/= 60 minutes as to demonstrate improved tolerance to standing for prolonged time and improved ability to participate in community activities such as shopping.  Baseline:  Goal status: INITIAL --------------------------------------------------------------------------------------------- PLAN:  PT FREQUENCY: 1-2x/week  PT DURATION: 6 weeks  PLANNED INTERVENTIONS: 97110-Therapeutic exercises, 97530- Therapeutic activity, 97112- Neuromuscular re-education, 97535- Self Care, 10272- Manual therapy, [phone]- Gait training, Patient/Family education, Balance training, Stair training, Dry Needling, and Joint mobilization  PLAN FOR NEXT SESSION: Review HEP, Begin POC as detailed in the assessment   Albesa Huguenin, PT, DPT 09/06/2023, 9:58 AM

## 2023-09-06 ENCOUNTER — Ambulatory Visit

## 2023-09-06 DIAGNOSIS — M25552 Pain in left hip: Secondary | ICD-10-CM | POA: Diagnosis not present

## 2023-09-06 DIAGNOSIS — M25652 Stiffness of left hip, not elsewhere classified: Secondary | ICD-10-CM

## 2023-09-07 ENCOUNTER — Encounter: Admitting: Physical Therapy

## 2023-09-07 ENCOUNTER — Ambulatory Visit: Payer: Self-pay | Admitting: Nurse Practitioner

## 2023-09-07 NOTE — Telephone Encounter (Signed)
 Return call unanswered by patient. Will discuss at future appointment. No additional labs are needed at this time.

## 2023-09-07 NOTE — Telephone Encounter (Signed)
 Patient called in for her lab results from 08/13/2023. This RN read the following statement from Winda Hastings, NP:   "A1c not at goal. Up to 8.5  She should be taking 2 tablets of glimepiride  in the mornings.  I am also adding jardiance  daily. Will send to pharmacy"  Patient states understanding. Pt states she has been taking the above medications as prescribed. Pt asks about her thyroid  panel, however, this was last tested on 04/16/2023. Pt would like a call back letting her know when Winda Hastings, NP, would like for her to come back for retesting of A1c and thyroid  levels. This RN confirmed call back number is [phone].    Copied from CRM [id number]. Topic: Clinical - Lab/Test Results >> Sep 07, 2023 10:07 AM DeAngela L wrote: Reason for CRM: patient received letter to call about getting her lab results Reason for Disposition  [1] Caller requesting NON-URGENT health information AND [2] PCP's office is the best resource  Answer Assessment - Initial Assessment Questions 1. REASON FOR CALL or QUESTION: "What is your reason for calling today?" or "How can I best help you?" or "What question do you have that I can help answer?"     RESULTS  Protocols used: Information Only Call - No Triage-A-AH

## 2023-09-13 ENCOUNTER — Ambulatory Visit

## 2023-09-13 ENCOUNTER — Other Ambulatory Visit: Payer: Self-pay | Admitting: Nurse Practitioner

## 2023-09-13 ENCOUNTER — Other Ambulatory Visit: Payer: Self-pay | Admitting: Podiatry

## 2023-09-13 DIAGNOSIS — M25552 Pain in left hip: Secondary | ICD-10-CM

## 2023-09-13 DIAGNOSIS — E039 Hypothyroidism, unspecified: Secondary | ICD-10-CM

## 2023-09-13 MED ORDER — LEVOTHYROXINE SODIUM 175 MCG PO TABS: 175.0000 ug | ORAL_TABLET | Freq: Every day | ORAL | 0 refills | Status: DC

## 2023-09-13 MED ORDER — CELECOXIB 100 MG PO CAPS: 100.0000 mg | ORAL_CAPSULE | Freq: Two times a day (BID) | ORAL | 0 refills | Status: AC

## 2023-09-13 MED ORDER — AMMONIUM LACTATE 12 % EX LOTN: 1.0000 | TOPICAL_LOTION | CUTANEOUS | 0 refills | Status: AC | PRN

## 2023-09-17 ENCOUNTER — Other Ambulatory Visit: Payer: Self-pay | Admitting: Nurse Practitioner

## 2023-09-17 DIAGNOSIS — E039 Hypothyroidism, unspecified: Secondary | ICD-10-CM

## 2023-09-17 MED ORDER — LEVOTHYROXINE SODIUM 175 MCG PO TABS: 175.0000 ug | ORAL_TABLET | Freq: Every day | ORAL | 2 refills | Status: DC

## 2023-09-20 ENCOUNTER — Other Ambulatory Visit: Payer: Self-pay

## 2023-09-20 ENCOUNTER — Ambulatory Visit: Attending: Nurse Practitioner | Admitting: Physical Therapy

## 2023-09-20 ENCOUNTER — Encounter: Payer: Self-pay | Admitting: Physical Therapy

## 2023-09-20 DIAGNOSIS — M25652 Stiffness of left hip, not elsewhere classified: Secondary | ICD-10-CM | POA: Diagnosis present

## 2023-09-20 DIAGNOSIS — M7989 Other specified soft tissue disorders: Secondary | ICD-10-CM | POA: Insufficient documentation

## 2023-09-20 DIAGNOSIS — M25552 Pain in left hip: Secondary | ICD-10-CM | POA: Insufficient documentation

## 2023-09-20 MED ORDER — CETIRIZINE HCL 10 MG PO TABS
10.0000 mg | ORAL_TABLET | Freq: Every day | ORAL | 1 refills | Status: DC
  Filled 2023-09-20 – 2023-10-15 (×2): qty 90, 90d supply, fill #0

## 2023-09-20 MED ORDER — LINZESS 145 MCG PO CAPS
145.0000 ug | ORAL_CAPSULE | Freq: Every day | ORAL | 3 refills | Status: DC
  Filled 2023-09-20 – 2023-10-15 (×2): qty 30, 30d supply, fill #0

## 2023-09-20 NOTE — Therapy (Addendum)
 OUTPATIENT PHYSICAL THERAPY TREATMENT NOTE   Patient Name: Melissa Harmon MRN: [id number] DOB:04/23/1956, 68 y.o., female Today's Date: 09/20/2023  PHYSICAL THERAPY DISCHARGE SUMMARY  Visits from Start of Care: 3  Current functional level related to goals / functional outcomes: see assessment   Remaining deficits: see assessment   Education / Equipment: see assessment   Patient agrees to discharge. Patient goals were unknown. Patient is being discharged due to not returning since the last visit.   END OF SESSION:  PT End of Session - 09/20/23 0912     Visit Number 3    Number of Visits 7    Date for PT Re-Evaluation 10/06/23    Authorization Type [employer]    PT Start Time 0830    PT Stop Time 0910    PT Time Calculation (min) 40 min    Activity Tolerance Patient tolerated treatment well    Behavior During Therapy WFL for tasks assessed/performed               Past Medical History:  Diagnosis Date   Bronchitis    Carpal tunnel syndrome on both sides    Family history of breast cancer    Family history of colonic polyps    Family history of lung cancer    Family history of ovarian cancer    Family history of stomach cancer    Fibromyalgia    GERD (gastroesophageal reflux disease)    History of chronic bronchitis    History of Hashimoto thyroiditis    s/p  bilateral thryoidectomy 03/ 2001   Hyperlipidemia    Hypothyroidism, postsurgical followed by pcp   08-04-1999 s/p  bilateral thyroidectomy for multinodular goitar (per path hashimoto's thyroiditis)   Irritable bowel syndrome with constipation    MDD (major depressive disorder)    Pelvic pain    Right ovarian cyst    Type 2 diabetes mellitus (HCC)    followed by pcp  (08-24-2019 pt stated checks blood sugar dialy in am,  fasting-- 159-175)   Wears contact lenses    Wears dentures    upper   Past Surgical History:  Procedure Laterality Date   CESAREAN SECTION  1986   COLONOSCOPY  last one 05-25-2018    LAPAROSCOPIC UNILATERAL SALPINGO OOPHERECTOMY Bilateral 09/01/2019   Procedure: LAPAROSCOPIC BILATERAL SALPINGO OOPHORECTOMY;  Surgeon: Loa Riling, DO;  Location: [facility];  Service: Gynecology;  Laterality: Bilateral;   THYROIDECTOMY Bilateral 08-04-1999  dr Kristan Petit  @[facility]    subtotal   UPPER GASTROINTESTINAL ENDOSCOPY     Patient Active Problem List   Diagnosis Date Noted   Pain due to onychomycosis of toenails of both feet 05/15/2022   Anxiety 11/26/2021   Atopic dermatitis 11/26/2021   Irritable bowel syndrome with constipation 11/26/2021   Type 2 diabetes mellitus with hyperglycemia (HCC) 11/26/2021   Unspecified condition associated with female genital organs and menstrual cycle 11/26/2021   Diabetes mellitus with neuropathy (HCC) 06/07/2020   Diabetic neuropathy (HCC) 06/07/2020   Genetic testing 05/22/2020   Family history of colonic polyps    Family history of ovarian cancer    Family history of stomach cancer    Family history of lung cancer    Family history of breast cancer    Adjustment disorder with anxious mood 09/18/2017   Insomnia due to other mental disorder 09/18/2017   DYSURIA 10/25/2009   ABDOMINAL PAIN, RIGHT UPPER QUADRANT 10/25/2009   NECK PAIN, LEFT 08/23/2009   HAND PAIN, RIGHT 08/23/2009  ELBOW PAIN, BILATERAL 05/17/2009   Tobacco user 05/13/2009   DIABETES MELLITUS, TYPE II, WITHOUT COMPLICATIONS 12/14/2008   DYSLIPIDEMIA 12/14/2008   SHORTNESS OF BREATH 12/14/2008   RECTAL BLEEDING 04/22/2007   History of colonic polyps 04/22/2007   BLOOD IN STOOL 04/15/2007   PITYRIASIS ROSEA 08/09/2006   HYPOTHYROIDISM, UNSPECIFIED 07/08/2006   Esophageal reflux 07/08/2006   FIBROMYALGIA, FIBROMYOSITIS 07/08/2006    PCP: Collins Dean, NP  REFERRING PROVIDER: Collins Dean, NP  REFERRING DIAG: Left hip pain [M25.552]   Rationale for Evaluation and Treatment: Rehabilitation  THERAPY DIAG:  Pain in left hip  Stiffness  of left hip, not elsewhere classified  Other specified soft tissue disorders  PERTINENT HISTORY: Hypothyroidism, DM2  WEIGHT BEARING RESTRICTIONS: No  FALLS:  Has patient fallen in last 6 months? No  LIVING ENVIRONMENT: Lives with: lives alone Lives in: House/apartment Stairs: No Has following equipment at home: None  OCCUPATION: retired   PRECAUTIONS: None ---------------------------------------------------------------------------------------------  SUBJECTIVE:   SUBJECTIVE STATEMENT: L hip pain a bit better, has been compliant with HEP  RED FLAGS: None   PLOF: Independent  PATIENT GOALS: help pain in L hip  NEXT MD VISIT: 3 months ---------------------------------------------------------------------------------------------  OBJECTIVE:  Note: Objective measures were completed at Evaluation unless otherwise noted.  DIAGNOSTIC FINDINGS: no recent imaging on L hip  PATIENT SURVEYS:  LEFS 34/80   COGNITION: Overall cognitive status: Within functional limits for tasks assessed     SENSATION: WFL  EDEMA:    MUSCLE LENGTH: Hamstrings: B limited (potential guarding) Thomas test: B limited w/ pain in anterior hip   POSTURE: rounded shoulders and forward head  PALPATION: Tenderness in L piriformis (reproduced symptoms)  LOWER EXTREMITY ROM:  Passive ROM Right eval Left eval  Hip flexion WFL! WFL!  Hip extension [facility]!  Hip abduction    Hip adduction    Hip internal rotation Limited! Limited!  Hip external rotation [facility]  Knee flexion    Knee extension    Ankle dorsiflexion    Ankle plantarflexion    Ankle inversion    Ankle eversion     (Blank rows = not tested)  ! Indicates pain with testing  LOWER EXTREMITY MMT:  MMT Right eval Left eval  Hip flexion 3+ 3+  Hip extension 4- 4-  Hip abduction 4- 4-  Hip adduction    Hip internal rotation    Hip external rotation    Knee flexion 4 4  Knee extension 4 4  Ankle dorsiflexion     Ankle plantarflexion    Ankle inversion    Ankle eversion     (Blank rows = not tested)  ! Indicates pain with testing  LOWER EXTREMITY SPECIAL TESTS:  Hip special tests: [name] (FABER) test: positive  and Piriformis test: positive  Slump: +  GAIT: Distance walked: 154ft Assistive device utilized: None Level of assistance: Complete Independence Comments: antalgic limp when initiating gait on L side  [facility] Adult PT Treatment:                                                DATE: 09/20/2023  Therapeutic Exercise: NuStep  5' lvl 5  Seated hamstring stretch w/APT 2x1' B Piriformis cross body stretch 2x1' Therapeutic Activity: S/L clams 2x10, hold 3s Core bracing practice with deep breathing 1x10 breaths Supine bridge against Pball and GTB  2x12, hold 3s Mini curl up w/Pball 3x30s   [facility] Adult PT Treatment:                                                DATE: 09/06/23 Therapeutic Exercise: Nustep L2 8 min Neuromuscular re-ed: Supine hip fallouts RTB 15x B, 15/15 unilaterally S/L clams 15x B Bridge RTB 15x Bridge against ball 15x STS arms 10x  Therapeutic Activity: B hamstring stretch seated 30s x2 L piriformis stretch 30s x2 L hip flexor stretch 30s x2 Prone on elbows 2 min                                                                                                                          PATIENT EDUCATION:  Education details: Pt received education regarding HEP performance, ADL performance, functional activity tolerance, impairment education, appropriate performance of therapeutic activities. Person educated: Patient Education method: Explanation, Demonstration, Tactile cues, Verbal cues, and Handouts Education comprehension: verbalized understanding and returned demonstration  HOME EXERCISE PROGRAM: Access Code: [email or web address] URL: [email or web address] Date: 08/25/2023 Prepared by: Albesa Huguenin  Exercises - Piriformis Mobilization with Small  Ball  - 2-3 x daily - 5 x weekly - 1 sets - 1 reps - 34m hold - Supine piriformis stretch with knee extension  - 1 x daily - 7 x weekly - 2 sets - 20 reps - 1 hold - Supine Piriformis Stretch with Foot on Ground  - 1 x daily - 7 x weekly - 2 sets - 1 reps - 59m hold - Supine Bridge with Resistance Band  - 1 x daily - 7 x weekly - 3 sets - 8 reps - 3s hold - Clamshell with Resistance  - 1 x daily - 7 x weekly - 3 sets - 8 reps - 2s hold ---------------------------------------------------------------------------------------------  ASSESSMENT:  CLINICAL IMPRESSION:  Pt attended physical therapy session for continuation of treatment regarding  B hip pain. Today's treatment focused on improvement of  lumbo pelvic motility/strength. Pt showed  great tolerance to administered treatment with no adverse effects by the end of session. Pt required minimal verbal/tactile cuing alongside no physical assistance for safe and appropriate performance of today's activities. Continue to progress with lumbo pelvic strength, stability, and motility as tolerated.   Eval impression (08/25/2023): Pt. attended today's physical therapy session for evaluation of L hip pain. Pt has complaints of 8/10 L hip pain onset 7 months with occasionally n/t symptoms down l leg, prolonged sitting and standing make symptoms worse. Pt has notable deficits with Hip internal ROM, global hip weakness, and increased resting tone of piriformis.  Signs and symptoms are concurrent with piriformis syndrome. Pt would benefit from therapeutic focus on piriformis motility, global hip strengthening/stability, and sciatic nerve motility.  Treatment performed today focused on pt education, detailed in obj. Pt demonstrated great understanding of education provided.  required minimal verbal/tactile cues and no assistance for appropriate performance with today's activities. Pt requires the intervention of skilled outpatient physical therapy to address the  aforementioned deficits and progress towards a functional level in line with therapeutic goals.   OBJECTIVE IMPAIRMENTS: Abnormal gait, decreased activity tolerance, decreased mobility, difficulty walking, decreased ROM, decreased strength, increased fascial restrictions, improper body mechanics, and pain.   ACTIVITY LIMITATIONS: carrying, bending, sitting, standing, squatting, and locomotion level  PARTICIPATION LIMITATIONS: interpersonal relationship, driving, and community activity  PERSONAL FACTORS: Past/current experiences, Time since onset of injury/illness/exacerbation, and 1-2 comorbidities: DM2, hypothyroidism are also affecting patient's functional outcome.   REHAB POTENTIAL: Good  CLINICAL DECISION MAKING: Stable/uncomplicated  EVALUATION COMPLEXITY: Low   GOALS: Goals reviewed with patient? YES  SHORT TERM GOALS: Target date: 09/15/2023   Pt will be independent with administered HEP to demonstrate the competency necessary for long term managemnet of symptoms at home.  Baseline: Goal status: INITIAL   LONG TERM GOALS: Target date: 10/06/2023    Pt. Will achieve a LEFS score of 50/80 as to demonstrate improvement in self-perceived functional ability with daily activities.  Baseline: 34/80 Goal status: INITIAL  2.  Pt will improve Global Hip/knee strength to a 4+/5 to demonstrate improvement in strength for quality of motion and activity performance.  Baseline: see obj chart Goal status: INITIAL  3.  Pt will report pain levels improving during ADLs to be less than or equal to 2/10 as to demonstrate improved tolerance with daily functional activities such as dressing and walking around the house.  Baseline:  Goal status: INITIAL  4.  Pt will report the ability to stand for >/= 60 minutes as to demonstrate improved tolerance to standing for prolonged time and improved ability to participate in community activities such as shopping.  Baseline:  Goal status:  INITIAL --------------------------------------------------------------------------------------------- PLAN:  PT FREQUENCY: 1-2x/week  PT DURATION: 6 weeks  PLANNED INTERVENTIONS: 97110-Therapeutic exercises, 97530- Therapeutic activity, 97112- Neuromuscular re-education, 97535- Self Care, 40981- Manual therapy, [phone]- Gait training, Patient/Family education, Balance training, Stair training, Dry Needling, and Joint mobilization  PLAN FOR NEXT SESSION:Continue to progress with lumbo pelvic strength, stability, and motility as tolerated.   Albesa Huguenin, PT, DPT 09/20/2023, 9:13 AM

## 2023-09-24 ENCOUNTER — Ambulatory Visit: Payer: Self-pay

## 2023-09-24 NOTE — Telephone Encounter (Signed)
 Chief Complaint: knee pain Symptoms: left knee pain Frequency: over a month Pertinent Negatives: Patient denies injury Disposition: [] ED /[] Urgent Care (no appt availability in office) / [] Appointment(In office/virtual)/ []  [facility]/ [] Home Care/ [] Refused Recommended Disposition /[]  Mobile Bus/ [x]  Follow-up with PCP Additional Notes: pt states that she is been experiencing left knee pain for about a month. States that she has pain when going up and down the steps to get in her home. States pain 7/10 right now and she been taking Celebrex  for her hip pain. States pain comes and goes and its an aching pain. Pt offered appt but was for June 11 but pt states she can only do mornings due to transportation. Please reach out to patient for morning appt. Also instructed pt of her last shingles vaccination dates.    Copied from CRM [id number]. Topic: Clinical - Medical Advice >> Sep 24, 2023 10:25 AM Crispin Dolphin wrote: Reason for CRM: Patient called. States pharmacy wanted her to find out if it was time for her Shingles shot. Let her know when she had the last ones and that I do not show it on the list of items due. Can someone let her know when she will need a new one. Thank You Reason for Disposition  Knee pain is a chronic symptom (recurrent or ongoing AND present > 4 weeks)  Answer Assessment - Initial Assessment Questions 1. LOCATION and RADIATION: "Where is the pain located?"      Left knee 2. QUALITY: "What does the pain feel like?"  (e.g., sharp, dull, aching, burning)     aching 3. SEVERITY: "How bad is the pain?" "What does it keep you from doing?"   (Scale 1-10; or mild, moderate, severe)   -  MILD (1-3): doesn't interfere with normal activities    -  MODERATE (4-7): interferes with normal activities (e.g., work or school) or awakens from sleep, limping    -  SEVERE (8-10): excruciating pain, unable to do any normal activities, unable to walk     mod 4. ONSET: "When  did the pain start?" "Does it come and go, or is it there all the time?"      A month ago 5. RECURRENT: "Have you had this pain before?" If Yes, ask: "When, and what happened then?"     no 6. SETTING: "Has there been any recent work, exercise or other activity that involved that part of the body?"      no 7. AGGRAVATING FACTORS: "What makes the knee pain worse?" (e.g., walking, climbing stairs, running)     stairs 8. ASSOCIATED SYMPTOMS: "Is there any swelling or redness of the knee?"     no  Protocols used: Knee Pain-A-AH

## 2023-09-24 NOTE — Telephone Encounter (Signed)
 Call to patient for offer [facility] or [facility] unable to reach Vm left

## 2023-10-05 ENCOUNTER — Ambulatory Visit: Admitting: Physical Therapy

## 2023-10-06 ENCOUNTER — Other Ambulatory Visit: Payer: Self-pay

## 2023-10-08 ENCOUNTER — Other Ambulatory Visit: Payer: Self-pay | Admitting: Nurse Practitioner

## 2023-10-08 DIAGNOSIS — K219 Gastro-esophageal reflux disease without esophagitis: Secondary | ICD-10-CM

## 2023-10-15 ENCOUNTER — Other Ambulatory Visit: Payer: Self-pay

## 2023-10-27 ENCOUNTER — Other Ambulatory Visit: Payer: Self-pay

## 2023-10-31 ENCOUNTER — Other Ambulatory Visit: Payer: Self-pay | Admitting: Nurse Practitioner

## 2023-10-31 DIAGNOSIS — E119 Type 2 diabetes mellitus without complications: Secondary | ICD-10-CM

## 2023-11-04 DIAGNOSIS — R232 Flushing: Secondary | ICD-10-CM | POA: Diagnosis not present

## 2023-11-04 DIAGNOSIS — B9689 Other specified bacterial agents as the cause of diseases classified elsewhere: Secondary | ICD-10-CM | POA: Diagnosis not present

## 2023-11-09 ENCOUNTER — Ambulatory Visit: Payer: 59 | Attending: Nurse Practitioner | Admitting: Nurse Practitioner

## 2023-11-09 ENCOUNTER — Encounter: Payer: Self-pay | Admitting: Nurse Practitioner

## 2023-11-09 VITALS — BP 130/75 | HR 59 | Resp 19 | Ht 63.0 in | Wt 153.8 lb

## 2023-11-09 DIAGNOSIS — Z7984 Long term (current) use of oral hypoglycemic drugs: Secondary | ICD-10-CM | POA: Diagnosis not present

## 2023-11-09 DIAGNOSIS — E119 Type 2 diabetes mellitus without complications: Secondary | ICD-10-CM

## 2023-11-09 DIAGNOSIS — Z0000 Encounter for general adult medical examination without abnormal findings: Secondary | ICD-10-CM

## 2023-11-09 DIAGNOSIS — K219 Gastro-esophageal reflux disease without esophagitis: Secondary | ICD-10-CM | POA: Diagnosis not present

## 2023-11-09 MED ORDER — PANTOPRAZOLE SODIUM 40 MG PO TBEC: 40.0000 mg | DELAYED_RELEASE_TABLET | Freq: Every day | ORAL | 1 refills | Status: AC

## 2023-11-09 MED ORDER — GLIMEPIRIDE 1 MG PO TABS: 1.0000 mg | ORAL_TABLET | Freq: Every day | ORAL | 1 refills | Status: DC

## 2023-11-09 NOTE — Progress Notes (Signed)
 Subjective:    Melissa Harmon is a 68 y.o. female who presents for a Welcome to Medicare exam.   Cardiac Risk Factors include: diabetes mellitus;advanced age (>89men, >71 women)      Objective:    Today's Vitals   11/09/23 0839  BP: 130/75  Pulse: (!) 59  Resp: 19  SpO2: 97%  Weight: 153 lb 12.8 oz (69.8 kg)  Height: 5' 3 (1.6 m)  PainSc: 0-No pain  Body mass index is 27.24 kg/m.  Medications Outpatient Encounter Medications as of 11/09/2023  Medication Sig   albuterol  (PROAIR  HFA) 108 (90 Base) MCG/ACT inhaler Inhale 2 puffs into the lungs every 4 (four) hours as needed for wheezing or shortness of breath.   albuterol  (VENTOLIN  HFA) 108 (90 Base) MCG/ACT inhaler Inhale 2 puffs into the lungs every 6 (six) hours as needed for wheezing.   ammonium lactate  (AMLACTIN) 12 % lotion Apply 1 Application topically as needed for dry skin.   atorvastatin  (LIPITOR) 40 MG tablet Take 1 tablet (40 mg total) by mouth daily.   celecoxib  (CELEBREX ) 100 MG capsule Take 1 capsule (100 mg total) by mouth 2 (two) times daily. For hip pain   cetirizine  (ZYRTEC ) 10 MG tablet Take 1 tablet (10 mg total) by mouth daily.   docusate sodium  (COLACE) 100 MG capsule Take 1 capsule (100 mg total) by mouth 2 (two) times daily as needed for mild constipation.   empagliflozin  (JARDIANCE ) 10 MG TABS tablet Take 1 tablet (10 mg total) by mouth daily before breakfast.   folic acid  (FOLVITE ) 1 MG tablet Take 1 tablet (1 mg total) by mouth daily.   glucose blood test strip ONE TOUCH STRIPS. Inject into the skin twice daily E11.65   Lancet Devices (ONE TOUCH DELICA LANCING DEV) MISC Inject into the skin twice daily E11.65   levothyroxine  (SYNTHROID ) 175 MCG tablet Take 1 tablet (175 mcg total) by mouth daily.   Lidocaine  5 % CREA Apply topically as prescribed   LINZESS  145 MCG CAPS capsule Take 1 capsule (145 mcg total) by mouth daily.   triamcinolone  (KENALOG ) 0.025 % ointment Apply 1 Application topically 2 (two)  times daily.   [DISCONTINUED] glimepiride  (AMARYL ) 1 MG tablet TAKE 1 TO 2 TABLETS BY MOUTH ONCE DAILY BEFORE BREAKFAST   [DISCONTINUED] pantoprazole  (PROTONIX ) 40 MG tablet Take 1 tablet by mouth once daily   glimepiride  (AMARYL ) 1 MG tablet Take 1 tablet (1 mg total) by mouth daily with breakfast.   pantoprazole  (PROTONIX ) 40 MG tablet Take 1 tablet (40 mg total) by mouth daily.   [DISCONTINUED] omeprazole  (PRILOSEC) 20 MG capsule Take 1 capsule (20 mg total) by mouth daily.   Facility-Administered Encounter Medications as of 11/09/2023  Medication   0.9 %  sodium chloride  infusion     History: Past Medical History:  Diagnosis Date   Bronchitis    Carpal tunnel syndrome on both sides    Family history of breast cancer    Family history of colonic polyps    Family history of lung cancer    Family history of ovarian cancer    Family history of stomach cancer    Fibromyalgia    GERD (gastroesophageal reflux disease)    History of chronic bronchitis    History of Hashimoto thyroiditis    s/p  bilateral thryoidectomy 03/ 2001   Hyperlipidemia    Hypothyroidism, postsurgical followed by pcp   08-04-1999 s/p  bilateral thyroidectomy for multinodular goitar (per path hashimoto's thyroiditis)   Irritable  bowel syndrome with constipation    MDD (major depressive disorder)    Pelvic pain    Right ovarian cyst    Type 2 diabetes mellitus (HCC)    followed by pcp  (08-24-2019 pt stated checks blood sugar dialy in am,  fasting-- 159-175)   Wears contact lenses    Wears dentures    upper   Past Surgical History:  Procedure Laterality Date   CESAREAN SECTION  1986   COLONOSCOPY  last one 05-25-2018   LAPAROSCOPIC UNILATERAL SALPINGO OOPHERECTOMY Bilateral 09/01/2019   Procedure: LAPAROSCOPIC BILATERAL SALPINGO OOPHORECTOMY;  Surgeon: Delana Ted Morrison, DO;  Location: [facility];  Service: Gynecology;  Laterality: Bilateral;   THYROIDECTOMY Bilateral 08-04-1999  dr  adel  @[facility]    subtotal   UPPER GASTROINTESTINAL ENDOSCOPY      Family History  Problem Relation Age of Onset   Diabetes Mother    Heart disease Mother    Stroke Mother    Colon cancer Father    Bipolar disorder Father    Colon polyps Father        unknown number   Ovarian cancer Sister 11       or other gynecologic cancer   Schizophrenia Brother    Heart disease Brother    Diverticulitis Maternal Aunt    Stomach cancer Maternal Grandmother        dx ?   Heart attack Maternal Grandmother    Lung cancer Cousin        dx 3s (maternal first cousin)   Breast cancer Cousin        dx 60s/70s (maternal first cousin)   Heart disease Half-Brother    Colon polyps Other 25       >80 polyps (6th degree relative - mother's cousin's grandson)   Esophageal cancer Neg Hx    Rectal cancer Neg Hx    Social History   Occupational History   Not on file  Tobacco Use   Smoking status: Every Day    Current packs/day: 0.25    Average packs/day: 0.3 packs/day for 25.0 years (6.3 ttl pk-yrs)    Types: Cigarettes    Passive exposure: Current   Smokeless tobacco: Never   Tobacco comments:    6 cig per day  Vaping Use   Vaping status: Never Used  Substance and Sexual Activity   Alcohol use: Yes    Alcohol/week: 6.0 standard drinks of alcohol    Types: 6 Cans of beer per week    Comment: ocasional glass of wine   Drug use: Never   Sexual activity: Not Currently    Birth control/protection: Post-menopausal    Tobacco Counseling Ready to quit: No Counseling given: Yes Tobacco comments: 6 cig per day   Immunizations and Health Maintenance Immunization History  Administered Date(s) Administered   Fluzone Influenza virus vaccine,trivalent (IIV3), split virus 01/04/2013, 03/25/2014, 02/09/2020   Hep B, Unspecified 06/21/2018, 07/21/2018   Hepatitis B, ADULT 06/20/2018   Hepb-cpg 06/21/2018, 07/21/2018   Influenza Inj Mdck Quad Pf 02/22/2018, 02/25/2018   Influenza Split 03/10/2012    Influenza Whole 04/22/2007, 03/08/2009   Influenza, Seasonal, Injecte, Preservative Fre 04/02/2021   Influenza,inj,Quad PF,6+ Mos 03/16/2018   PFIZER(Purple Top)SARS-COV-2 Vaccination 07/21/2019, 08/11/2019, 04/15/2020   Polio, Unspecified 11/22/1958, 01/17/1959, 08/22/1959, 10/21/1967   Smallpox 01/31/1959   Td 06/27/2008   Td (Adult),unspecified 10/21/1967   Tdap 06/27/2008, 11/12/2020   Zoster Recombinant(Shingrix) 11/12/2020, 02/07/2021   Health Maintenance Due  Topic Date Due   COVID-19  Vaccine (4 - 2024-25 season) 01/10/2023   FOOT EXAM  02/07/2023   MAMMOGRAM  05/27/2023   Diabetic kidney evaluation - Urine ACR  07/13/2023    Activities of Daily Living    11/09/2023    8:41 AM  In your present state of health, do you have any difficulty performing the following activities:  Hearing? 0  Vision? 1  Difficulty concentrating or making decisions? 1  Walking or climbing stairs? 1  Dressing or bathing? 0  Doing errands, shopping? 0  Preparing Food and eating ? Y  Using the Toilet? Y  In the past six months, have you accidently leaked urine? N  Do you have problems with loss of bowel control? N  Managing your Medications? Y  Managing your Finances? Y  Housekeeping or managing your Housekeeping? Y    Physical Exam   Physical Exam (optional), or other factors deemed appropriate based on the beneficiary's medical and social history and current clinical standards.   Advanced Directives: Does Patient Have a Medical Advance Directive?: No Would patient like information on creating a medical advance directive?: Yes (MAU/Ambulatory/Procedural Areas - Information given)        Assessment:    This is a routine wellness examination for this patient .   Vision/Hearing screen Vision Screening   Right eye Left eye Both eyes  Without correction     With correction 20/13 20/15 20/13      Goals   None     Depression Screen    11/09/2023    9:01 AM 08/13/2023    4:07  PM 04/16/2023   10:44 AM 01/12/2023    9:24 AM  PHQ 2/9 Scores  PHQ - 2 Score 4 2 5  0  PHQ- 9 Score 12 6 15 5      Fall Risk    11/09/2023    8:40 AM  Fall Risk   Falls in the past year? 0  Number falls in past yr: 0  Injury with Fall? 0  Risk for fall due to : No Fall Risks  Follow up Falls evaluation completed    Cognitive Function:        11/09/2023    8:48 AM  6CIT Screen  What Year? 0 points  What month? 0 points  What time? 0 points  Count back from 20 2 points  Months in reverse 2 points  Repeat phrase 4 points  Total Score 8 points    Patient Care Team: Theotis Haze ORN, NP as PCP - General (Nurse Practitioner) Tobie Tonita POUR, DO as Consulting Physician (Neurology)     Plan:     I have personally reviewed and noted the following in the patient's chart:   Medical and social history Use of alcohol, tobacco or illicit drugs  Current medications and supplements including opioid prescriptions. Patient is not currently taking opioid prescriptions. Functional ability and status Nutritional status Physical activity Advanced directives List of other physicians Hospitalizations, surgeries, and ER visits in previous 12 months Vitals Screenings to include cognitive, depression, and falls Referrals and appointments  In addition, I have reviewed and discussed with patient certain preventive protocols, quality metrics, and best practice recommendations. A written personalized care plan for preventive services as well as general preventive health recommendations were provided to patient.     Haze ORN Theotis, NP 11/09/2023

## 2023-11-15 ENCOUNTER — Ambulatory Visit: Attending: Primary Care

## 2023-11-15 DIAGNOSIS — E119 Type 2 diabetes mellitus without complications: Secondary | ICD-10-CM

## 2023-11-15 DIAGNOSIS — Z7984 Long term (current) use of oral hypoglycemic drugs: Secondary | ICD-10-CM | POA: Diagnosis not present

## 2023-11-16 ENCOUNTER — Ambulatory Visit: Payer: Self-pay | Admitting: Nurse Practitioner

## 2023-11-16 ENCOUNTER — Other Ambulatory Visit: Payer: Self-pay | Admitting: Nurse Practitioner

## 2023-11-16 DIAGNOSIS — E119 Type 2 diabetes mellitus without complications: Secondary | ICD-10-CM

## 2023-11-16 LAB — CMP14+EGFR
ALT: 49 IU/L — ABNORMAL HIGH (ref 0–32)
AST: 38 IU/L (ref 0–40)
Albumin: 4.1 g/dL (ref 3.9–4.9)
Alkaline Phosphatase: 140 IU/L — ABNORMAL HIGH (ref 44–121)
BUN/Creatinine Ratio: 15 (ref 12–28)
BUN: 12 mg/dL (ref 8–27)
Bilirubin Total: 0.4 mg/dL (ref 0.0–1.2)
CO2: 22 mmol/L (ref 20–29)
Calcium: 9.3 mg/dL (ref 8.7–10.3)
Chloride: 104 mmol/L (ref 96–106)
Creatinine, Ser: 0.79 mg/dL (ref 0.57–1.00)
Globulin, Total: 2.6 g/dL (ref 1.5–4.5)
Glucose: 144 mg/dL — ABNORMAL HIGH (ref 70–99)
Potassium: 4.8 mmol/L (ref 3.5–5.2)
Sodium: 142 mmol/L (ref 134–144)
Total Protein: 6.7 g/dL (ref 6.0–8.5)
eGFR: 83 mL/min/1.73 (ref >59)

## 2023-11-16 LAB — HEMOGLOBIN A1C
Est. average glucose Bld gHb Est-mCnc: 183 mg/dL
Hgb A1c MFr Bld: 8 % — ABNORMAL HIGH (ref 4.8–5.6)

## 2023-11-16 MED ORDER — EMPAGLIFLOZIN 25 MG PO TABS: 25.0000 mg | ORAL_TABLET | Freq: Every day | ORAL | 1 refills | Status: DC

## 2023-11-17 LAB — MICROALBUMIN / CREATININE URINE RATIO
Creatinine, Urine: 65.7 mg/dL
Microalb/Creat Ratio: 23 mg/g{creat} (ref 0–29)
Microalbumin, Urine: 15 ug/mL

## 2023-11-18 ENCOUNTER — Telehealth: Payer: Self-pay | Admitting: Nurse Practitioner

## 2023-11-18 NOTE — Telephone Encounter (Signed)
 Patient returning call for lab results, please advise.

## 2023-11-18 NOTE — Telephone Encounter (Signed)
 Noted

## 2023-11-18 NOTE — Telephone Encounter (Signed)
 Copied from CRM [id number]. Topic: General - Other >> Nov 18, 2023  3:46 PM Sophia H wrote:  Reason for CRM: Patient is calling in to inform PCP that moving forward her insurance will no longer be covering One touch meter/supplies. Will need new RX's sent for meter/supplies for the Accu-check. Ty, if need to reach out please call # [phone]   [facility] - [place], [place] - [address]

## 2023-11-19 ENCOUNTER — Other Ambulatory Visit: Payer: Self-pay | Admitting: Nurse Practitioner

## 2023-11-19 DIAGNOSIS — E119 Type 2 diabetes mellitus without complications: Secondary | ICD-10-CM

## 2023-11-19 MED ORDER — ACCU-CHEK GUIDE ME W/DEVICE KIT: PACK | 0 refills | Status: AC

## 2023-11-19 MED ORDER — ACCU-CHEK SOFTCLIX LANCETS MISC: 3 refills | Status: AC

## 2023-11-19 MED ORDER — ACCU-CHEK GUIDE TEST VI STRP
ORAL_STRIP | 12 refills | Status: AC
Start: 2023-11-19 — End: ?

## 2023-11-19 NOTE — Telephone Encounter (Signed)
-----   Message from Olam LELON Dais, RN sent at 11/19/2023 12:41 PM EDT -----  RICK Ohm, see notes

## 2023-11-19 NOTE — Telephone Encounter (Signed)
New meter and supplies sent.

## 2023-11-19 NOTE — Telephone Encounter (Unsigned)
 Copied from CRM [id number]. Topic: Clinical - Lab/Test Results >> Nov 19, 2023 11:22 AM Delon HERO wrote: Reason for CRM: Patient is calling back to receive lab results. Agent provided results to the patient. Patient would like to know does she need to continue to take glimepiride  (AMARYL ) 1 MG tablet [[id number]] in addition to the increased dosage of empagliflozin  (JARDIANCE ) 25 MG TABS tablet [[id number]]. Patient reporting that she has picked up the new script for Jardiance 

## 2023-12-04 ENCOUNTER — Other Ambulatory Visit: Payer: Self-pay

## 2023-12-04 ENCOUNTER — Emergency Department (HOSPITAL_COMMUNITY): Admission: EM | Admit: 2023-12-04 | Discharge: 2023-12-04 | Disposition: A | Discharge status: Home / Self Care

## 2023-12-04 DIAGNOSIS — W44F3XA Food entering into or through a natural orifice, initial encounter: Secondary | ICD-10-CM

## 2023-12-04 DIAGNOSIS — T18128A Food in esophagus causing other injury, initial encounter: Secondary | ICD-10-CM | POA: Diagnosis not present

## 2023-12-04 DIAGNOSIS — X58XXXA Exposure to other specified factors, initial encounter: Secondary | ICD-10-CM | POA: Insufficient documentation

## 2023-12-04 DIAGNOSIS — D72829 Elevated white blood cell count, unspecified: Secondary | ICD-10-CM | POA: Diagnosis not present

## 2023-12-04 DIAGNOSIS — J029 Acute pharyngitis, unspecified: Secondary | ICD-10-CM | POA: Diagnosis present

## 2023-12-04 LAB — CBC WITH DIFFERENTIAL/PLATELET
Abs Immature Granulocytes: 0.08 K/uL — ABNORMAL HIGH (ref 0.00–0.07)
Basophils Absolute: 0.1 K/uL (ref 0.0–0.1)
Basophils Relative: 0 %
Eosinophils Absolute: 0.1 K/uL (ref 0.0–0.5)
Eosinophils Relative: 0 %
HCT: 49.6 % — ABNORMAL HIGH (ref 36.0–46.0)
Hemoglobin: 15.7 g/dL — ABNORMAL HIGH (ref 12.0–15.0)
Immature Granulocytes: 1 %
Lymphocytes Relative: 19 %
Lymphs Abs: 2.9 K/uL (ref 0.7–4.0)
MCH: 28.2 pg (ref 26.0–34.0)
MCHC: 31.7 g/dL (ref 30.0–36.0)
MCV: 89.2 fL (ref 80.0–100.0)
Monocytes Absolute: 0.9 K/uL (ref 0.1–1.0)
Monocytes Relative: 6 %
Neutro Abs: 11.1 K/uL — ABNORMAL HIGH (ref 1.7–7.7)
Neutrophils Relative %: 74 %
Platelets: 283 K/uL (ref 150–400)
RBC: 5.56 MIL/uL — ABNORMAL HIGH (ref 3.87–5.11)
RDW: 14.1 % (ref 11.5–15.5)
WBC: 15.1 K/uL — ABNORMAL HIGH (ref 4.0–10.5)
nRBC: 0 % (ref 0.0–0.2)

## 2023-12-04 LAB — COMPREHENSIVE METABOLIC PANEL WITH GFR
ALT: 20 U/L (ref 0–44)
AST: 18 U/L (ref 15–41)
Albumin: 3.8 g/dL (ref 3.5–5.0)
Alkaline Phosphatase: 111 U/L (ref 38–126)
Anion gap: 10 (ref 5–15)
BUN: 9 mg/dL (ref 8–23)
CO2: 26 mmol/L (ref 22–32)
Calcium: 9.1 mg/dL (ref 8.9–10.3)
Chloride: 104 mmol/L (ref 98–111)
Creatinine, Ser: 0.66 mg/dL (ref 0.44–1.00)
GFR, Estimated: >60 mL/min (ref >60)
Glucose, Bld: 151 mg/dL — ABNORMAL HIGH (ref 70–99)
Potassium: 3.7 mmol/L (ref 3.5–5.1)
Sodium: 140 mmol/L (ref 135–145)
Total Bilirubin: 1.1 mg/dL (ref 0.0–1.2)
Total Protein: 7.8 g/dL (ref 6.5–8.1)

## 2023-12-04 MED ORDER — SUCRALFATE 1 G PO TABS: 1.0000 g | ORAL_TABLET | Freq: Three times a day (TID) | ORAL | 0 refills | Status: AC

## 2023-12-04 MED ORDER — GLUCAGON HCL RDNA (DIAGNOSTIC) 1 MG IJ SOLR
INTRAMUSCULAR | Status: AC
  Administered 2023-12-04: 0.5 mg via INTRAVENOUS
  Filled 2023-12-04: qty 1

## 2023-12-04 MED ORDER — GLUCAGON HCL RDNA (DIAGNOSTIC) 1 MG IJ SOLR: 0.5000 mg | Freq: Once | INTRAMUSCULAR | Status: AC

## 2023-12-04 MED ORDER — GLUCAGON (RDNA) 1 MG IJ KIT: 0.5000 mg | PACK | Freq: Once | INTRAMUSCULAR | 12 refills | Status: DC | PRN

## 2023-12-04 NOTE — ED Provider Notes (Signed)
 [place] [facility]PARTMENT AT [facility] Provider Note   CSN: [id number] Arrival date & time: 12/04/23  9156     Patient presents with: Sore Throat and Otalgia   Melissa Harmon is a 68 y.o. female.    Sore Throat  Otalgia   Sore throat.  Patient states has not been able to eat or drink over the past day.  States that was not able to take in solid p.o. intake yesterday because of pain whenever she swallows.  She states that whenever she drinks little bit of water she will suddenly feel like it goes down and she to spit half of it back up.  She states when she tries to eat food she subsequently has to spit all of it back up.  States that hurts really bad in the mid chest area.  She states that she has had endoscopy in the past but spent well over a decade.  She states she is taken a PPI 20 mg once per day.  No abdominal pain.  No melena.  Otherwise, feels in her normal state of health.  She does smoke cigarettes on a daily basis.  Patient states that she was eating pork chops yesterday when this started.      Prior to Admission medications   Medication Sig Start Date End Date Taking? Authorizing Provider  glucagon  1 MG injection Inject 0.5 mg into the vein once as needed for up to 1 dose. 12/04/23  Yes Simon Lavonia SAILOR, MD  sucralfate  (CARAFATE ) 1 g tablet Take 1 tablet (1 g total) by mouth 4 (four) times daily -  with meals and at bedtime. 12/04/23  Yes Simon Lavonia SAILOR, MD  Accu-Chek Softclix Lancets lancets Check blood glucose level by fingerstick twice per day. E11.65 11/19/23   Fleming, [name] W, NP  albuterol  (PROAIR  HFA) 108 (90 Base) MCG/ACT inhaler Inhale 2 puffs into the lungs every 4 (four) hours as needed for wheezing or shortness of breath. 06/21/23   Fleming, [name] W, NP  albuterol  (VENTOLIN  HFA) 108 [phone] Base) MCG/ACT inhaler Inhale 2 puffs into the lungs every 6 (six) hours as needed for wheezing. 07/13/22   Fleming, [name] W, NP  ammonium lactate  (AMLACTIN) 12 %  lotion Apply 1 Application topically as needed for dry skin. 09/13/23   Gershon Donnice SAUNDERS, DPM  atorvastatin  (LIPITOR) 40 MG tablet Take 1 tablet (40 mg total) by mouth daily. 11/19/22   Fleming, [name] W, NP  Blood Glucose Monitoring Suppl (ACCU-CHEK GUIDE ME) w/Device KIT Check blood glucose level by fingerstick twice per day. E11.65 11/19/23   Fleming, [name] W, NP  celecoxib  (CELEBREX ) 100 MG capsule Take 1 capsule (100 mg total) by mouth 2 (two) times daily. For hip pain 09/13/23   Fleming, [name] W, NP  cetirizine  (ZYRTEC ) 10 MG tablet Take 1 tablet (10 mg total) by mouth daily. 09/20/23   Newlin, Enobong, MD  docusate sodium  (COLACE) 100 MG capsule Take 1 capsule (100 mg total) by mouth 2 (two) times daily as needed for mild constipation. 11/19/22   Fleming, [name] W, NP  empagliflozin  (JARDIANCE ) 25 MG TABS tablet Take 1 tablet (25 mg total) by mouth daily before breakfast. 11/16/23   Fleming, [name] W, NP  folic acid  (FOLVITE ) 1 MG tablet Take 1 tablet (1 mg total) by mouth daily. 08/17/23   Patel, Donika K, DO  glimepiride  (AMARYL ) 1 MG tablet Take 1 tablet (1 mg total) by mouth daily with breakfast. 11/09/23   Fleming, [name]  W, NP  glucose blood (ACCU-CHEK GUIDE TEST) test strip Check blood glucose level by fingerstick twice per day. E11.65 11/19/23   Theotis Haze ORN, NP  Lancet Devices (ONE TOUCH DELICA LANCING DEV) MISC Inject into the skin twice daily E11.65 04/16/23   Fleming, [name] W, NP  levothyroxine  (SYNTHROID ) 175 MCG tablet Take 1 tablet (175 mcg total) by mouth daily. 09/17/23   Newlin, Enobong, MD  Lidocaine  5 % CREA Apply topically as prescribed 01/12/23   Fleming, [name] W, NP  LINZESS  145 MCG CAPS capsule Take 1 capsule (145 mcg total) by mouth daily. 09/20/23   Newlin, Enobong, MD  pantoprazole  (PROTONIX ) 40 MG tablet Take 1 tablet (40 mg total) by mouth daily. 11/09/23   Fleming, [name] W, NP  triamcinolone  (KENALOG ) 0.025 % ointment Apply 1 Application topically 2 (two) times daily. 01/12/23   Fleming,  [name] W, NP  omeprazole  (PRILOSEC) 20 MG capsule Take 1 capsule (20 mg total) by mouth daily. 12/26/14 06/09/22  Mark Rush, MD    Allergies: Gabapentin, Metformin and related, Sulfa antibiotics, Sulfamethoxazole, and Other    Review of Systems  HENT:  Positive for ear pain.     Updated Vital Signs BP 103/77 (BP Location: Left Arm)   Pulse 89   Temp 98.9 F (37.2 C) (Oral)   Resp 17   Ht 5' 3 (1.6 m)   Wt 70.3 kg   SpO2 97%   BMI 27.46 kg/m   Physical Exam Vitals and nursing note reviewed.  Constitutional:      General: She is not in acute distress.    Appearance: She is well-developed.  HENT:     Head: Normocephalic and atraumatic.  Eyes:     Conjunctiva/sclera: Conjunctivae normal.  Cardiovascular:     Rate and Rhythm: Normal rate and regular rhythm.     Heart sounds: No murmur heard. Pulmonary:     Effort: Pulmonary effort is normal. No respiratory distress.     Breath sounds: Normal breath sounds.  Abdominal:     Palpations: Abdomen is soft.     Tenderness: There is no abdominal tenderness.  Musculoskeletal:        General: No swelling.     Cervical back: Neck supple.  Skin:    General: Skin is warm and dry.     Capillary Refill: Capillary refill takes less than 2 seconds.  Neurological:     Mental Status: She is alert.  Psychiatric:        Mood and Affect: Mood normal.     (all labs ordered are listed, but only abnormal results are displayed) Labs Reviewed  CBC WITH DIFFERENTIAL/PLATELET - Abnormal; Notable for the following components:      Result Value   WBC 15.1 (*)    RBC 5.56 (*)    Hemoglobin 15.7 (*)    HCT 49.6 (*)    Neutro Abs 11.1 (*)    Abs Immature Granulocytes 0.08 (*)    All other components within normal limits  COMPREHENSIVE METABOLIC PANEL WITH GFR - Abnormal; Notable for the following components:   Glucose, Bld 151 (*)    All other components within normal limits    EKG: None  Radiology: No results  found.   Procedures   Medications Ordered in the ED  glucagon  (human recombinant) (GLUCAGEN ) injection 0.5 mg (0.5 mg Intravenous Given 12/04/23 1014)    Clinical Course as of 12/04/23 1400  Sat Dec 04, 2023  0939 Dr. Dawayne  [TL]  1051 Sucralfate   1 gm TID. Can crush in four ounces of water. Needs 40 mg of protonix  and pepcid breakthrough symptoms  [TL]    Clinical Course User Index [TL] Simon Lavonia SAILOR, MD                                 Medical Decision Making Amount and/or Complexity of Data Reviewed Labs: ordered.  Risk Prescription drug management.   Sore throat.  Patient states has not been able to eat or drink over the past day.  States that was not able to take in solid p.o. intake yesterday because of pain whenever she swallows.  She states that whenever she drinks little bit of water she will suddenly feel like it goes down and she to spit half of it back up.  She states when she tries to eat food she subsequently has to spit all of it back up.  States that hurts really bad in the mid chest area.  She states that she has had endoscopy in the past but spent well over a decade.  She states she is taken a PPI 20 mg once per day.  No abdominal pain.  No melena.  Otherwise, feels in her normal state of health.  She does smoke cigarettes on a daily basis.  Patient states that she was eating pork chops yesterday when this started.    On exam, patient Impeklo stable.  ANO x 3 GCS 15.   Pharynx clear.  No obvious peritonsillar abscess.  No significant erythema.  Uvula midline.   I did try to get the patient to swallow liquid.  Give her some ginger ale.  She subsequent had to spit a lot of this back up.  Concern for esophageal food bolus in the setting of chronic GERD and smoking history    I did speak to Dr. Kristie Just.  Recommended trying octreotide for the patient.  Subsequently gave 0.5 mg of IV octreotide.  Patient was unable to swallow.  Able to eat and drink.  Able to  drink carbonated beverage as well as eat crackers without difficulty.  She endorse some soreness but otherwise stated that she felt fine.  Given this, Dr. Kristie Just recommended Carafate  3-4 times daily.  Follow-up with gastroenterology.  Recommended PPI 40 mg daily.  Patient did have a small leukocytosis.  Likely reactive.  No indication of concern for any kind of esophageal perforation at this point in time.  Afebrile.  Lung sounds clear to ulceration bilaterally.  Soft benign abdomen.  Gave the patient strict return precautions.          Final diagnoses:  Esophageal obstruction due to food impaction  Leukocytosis, unspecified type    ED Discharge Orders          Ordered    glucagon  1 MG injection  Once PRN        12/04/23 1002    sucralfate  (CARAFATE ) 1 g tablet  3 times daily with meals & bedtime        12/04/23 1105               Simon Lavonia SAILOR, MD 12/04/23 1400

## 2023-12-04 NOTE — ED Notes (Signed)
 Pt last attempted to eat last night around 6-7pm (pork chop). Pt states it came right back up after trying to swallow it.

## 2023-12-04 NOTE — ED Notes (Signed)
 Pt able to tolerate a soda. States pain is less but still there.

## 2023-12-04 NOTE — ED Triage Notes (Signed)
 pT coming from home c/o throat and ear pain. Ear pain started last week and throat pain 3 days ago. States it hurt to swallow and breath. Has been vomiting since yesterday. Has taken pain meds with no relief.

## 2023-12-04 NOTE — Discharge Instructions (Signed)
 Please follow up with your GI doctor. Give them a call on Monday.   I did call in Carafate  for you.  Please pick this up at the pharmacy.  You can cut these pills and dissolve them in water and drink before you eat.  Can help coat the esophagus as well as stomach.   Please make sure you continue to take your PPI.  This should be 40 mg daily.  You can take famotidine 20 mg to be have any kind of symptomatic GERD at night.  You can purchase this over-the-counter.

## 2023-12-07 ENCOUNTER — Telehealth: Payer: Self-pay | Admitting: Nurse Practitioner

## 2023-12-07 NOTE — Telephone Encounter (Signed)
 Copied from CRM [id number]. Topic: Appointments - Appointment Scheduling >> Dec 06, 2023  1:19 PM Charlet HERO wrote:  Patient/patient representative is calling to schedule an appointment. Refer to attachments for appointment information.  Patient is going to need a sooner appt she was just in ER on Saturday please contact to schedule number ending in [phone]

## 2023-12-07 NOTE — Telephone Encounter (Signed)
 Called patient verified name and date of birth. Patient has been scheduled for 12/15/2023 at 9:30 am. Patient acknowledged appointment.

## 2023-12-07 NOTE — Telephone Encounter (Unsigned)
 Copied from CRM [id number]. Topic: Appointments - Appointment Cancel/Reschedule >> Dec 07, 2023  9:52 AM Nathanel BROCKS wrote: Patient/patient representative is calling to cancel or reschedule an appointment. Refer to attachments for appointment information.    Pt called back for appt on Aug 6 at 930 and she can not make it that day. I tried to reschedule but it took me out until Sept. This appt is for a follow up. Could you please see if you can work her in.

## 2023-12-07 NOTE — Telephone Encounter (Signed)
 Called patient verified name and date of birth. Patient appointment has been scheduled to 12/15/2023 at 2:30. Patient acknowledged appointment.

## 2023-12-14 ENCOUNTER — Telehealth: Payer: Self-pay | Admitting: Nurse Practitioner

## 2023-12-14 NOTE — Telephone Encounter (Signed)
 Pt confirmed appt 8/5

## 2023-12-15 ENCOUNTER — Encounter: Payer: Self-pay | Admitting: Nurse Practitioner

## 2023-12-15 ENCOUNTER — Inpatient Hospital Stay: Admitting: Nurse Practitioner

## 2023-12-15 ENCOUNTER — Ambulatory Visit: Attending: Nurse Practitioner | Admitting: Nurse Practitioner

## 2023-12-15 VITALS — BP 110/69 | HR 77 | Resp 19 | Ht 63.0 in | Wt 153.4 lb

## 2023-12-15 DIAGNOSIS — K5901 Slow transit constipation: Secondary | ICD-10-CM

## 2023-12-15 MED ORDER — LINZESS 145 MCG PO CAPS: 145.0000 ug | ORAL_CAPSULE | Freq: Two times a day (BID) | ORAL | 1 refills | Status: DC

## 2023-12-15 NOTE — Progress Notes (Addendum)
 Office Visit Note  Patient: Melissa Harmon             Date of Birth: July 28, 1955           MRN: [id number]             PCP: Theotis Haze ORN, NP Referring: Theotis Haze ORN, NP Visit Date: 12/28/2023 Occupation: @GUAROCC @  Subjective:   pain in bilateral hips, worse in the left    History of Present Illness: Melissa Harmon is a 68 y.o. female with fibromyalgia and osteoarthritis.  She returns today after her last visit in February 2025.  She states recently she has been having pain and discomfort in her leg muscles.  She denies any muscle weakness.  She states her shoulder joints are better.  Continues to have some discomfort in her hands and feet.  He continues to have pain and discomfort in her left trochanteric bursa.  She has generalized pain and discomfort from fibromyalgia.  She takes Celebrex  on a as needed basis.  Patient states she was evaluated by neurology for paresthesias in the feet.  EMG and nerve conduction velocities were unremarkable except for right carpal tunnel syndrome.    Activities of Daily Living:  Patient reports morning stiffness for 20-30 minutes.   Patient Reports nocturnal pain.  Difficulty dressing/grooming: Denies Difficulty climbing stairs: Reports Difficulty getting out of chair: Denies Difficulty using hands for taps, buttons, cutlery, and/or writing: Reports  Review of Systems  Constitutional:  Negative for fatigue.  HENT:  Positive for mouth dryness. Negative for mouth sores.   Eyes:  Negative for dryness.  Respiratory:  Negative for shortness of breath.   Cardiovascular:  Negative for chest pain and palpitations.  Gastrointestinal:  Positive for constipation. Negative for blood in stool and diarrhea.  Endocrine: Negative for increased urination.  Genitourinary:  Negative for involuntary urination.  Musculoskeletal:  Positive for joint pain, joint pain, myalgias, morning stiffness, muscle tenderness and myalgias. Negative for gait problem, joint  swelling and muscle weakness.  Skin:  Positive for rash. Negative for color change, hair loss and sensitivity to sunlight.  Allergic/Immunologic: Positive for susceptible to infections.  Neurological:  Positive for dizziness. Negative for headaches.  Hematological:  Negative for swollen glands.  Psychiatric/Behavioral:  Negative for depressed mood and sleep disturbance. The patient is not nervous/anxious.     PMFS History:  Patient Active Problem List   Diagnosis Date Noted   Pain due to onychomycosis of toenails of both feet 05/15/2022   Anxiety 11/26/2021   Atopic dermatitis 11/26/2021   Irritable bowel syndrome with constipation 11/26/2021   Type 2 diabetes mellitus with hyperglycemia (HCC) 11/26/2021   Unspecified condition associated with female genital organs and menstrual cycle 11/26/2021   Diabetes mellitus with neuropathy (HCC) 06/07/2020   Diabetic neuropathy (HCC) 06/07/2020   Genetic testing 05/22/2020   Family history of colonic polyps    Family history of ovarian cancer    Family history of stomach cancer    Family history of lung cancer    Family history of breast cancer    Adjustment disorder with anxious mood 09/18/2017   Insomnia due to other mental disorder 09/18/2017   DYSURIA 10/25/2009   ABDOMINAL PAIN, RIGHT UPPER QUADRANT 10/25/2009   NECK PAIN, LEFT 08/23/2009   HAND PAIN, RIGHT 08/23/2009   ELBOW PAIN, BILATERAL 05/17/2009   Tobacco user 05/13/2009   DIABETES MELLITUS, TYPE II, WITHOUT COMPLICATIONS 12/14/2008   DYSLIPIDEMIA 12/14/2008   SHORTNESS OF BREATH 12/14/2008  RECTAL BLEEDING 04/22/2007   History of colonic polyps 04/22/2007   BLOOD IN STOOL 04/15/2007   PITYRIASIS ROSEA 08/09/2006   HYPOTHYROIDISM, UNSPECIFIED 07/08/2006   Esophageal reflux 07/08/2006   FIBROMYALGIA, FIBROMYOSITIS 07/08/2006    Past Medical History:  Diagnosis Date   Bronchitis    Carpal tunnel syndrome on both sides    Family history of breast cancer    Family  history of colonic polyps    Family history of lung cancer    Family history of ovarian cancer    Family history of stomach cancer    Fibromyalgia    GERD (gastroesophageal reflux disease)    History of chronic bronchitis    History of Hashimoto thyroiditis    s/p  bilateral thryoidectomy 03/ 2001   Hyperlipidemia    Hypothyroidism, postsurgical followed by pcp   08-04-1999 s/p  bilateral thyroidectomy for multinodular goitar (per path hashimoto's thyroiditis)   Irritable bowel syndrome with constipation    MDD (major depressive disorder)    Pelvic pain    Right ovarian cyst    Type 2 diabetes mellitus (HCC)    followed by pcp  (08-24-2019 pt stated checks blood sugar dialy in am,  fasting-- 159-175)   Wears contact lenses    Wears dentures    upper    Family History  Problem Relation Age of Onset   Diabetes Mother    Heart disease Mother    Stroke Mother    Colon cancer Father    Bipolar disorder Father    Colon polyps Father        unknown number   Ovarian cancer Sister 15       or other gynecologic cancer   Schizophrenia Brother    Heart disease Brother    Diverticulitis Maternal Aunt    Stomach cancer Maternal Grandmother        dx ?   Heart attack Maternal Grandmother    Lung cancer Cousin        dx 87s (maternal first cousin)   Breast cancer Cousin        dx 60s/70s (maternal first cousin)   Heart disease Half-Brother    Colon polyps Other 25       >80 polyps (6th degree relative - mother's cousin's grandson)   Esophageal cancer Neg Hx    Rectal cancer Neg Hx    Past Surgical History:  Procedure Laterality Date   CESAREAN SECTION  1986   COLONOSCOPY  last one 05-25-2018   LAPAROSCOPIC UNILATERAL SALPINGO OOPHERECTOMY Bilateral 09/01/2019   Procedure: LAPAROSCOPIC BILATERAL SALPINGO OOPHORECTOMY;  Surgeon: Delana Ted Morrison, DO;  Location: [facility];  Service: Gynecology;  Laterality: Bilateral;   THYROIDECTOMY Bilateral 08-04-1999  dr  adel  @[facility]    subtotal   UPPER GASTROINTESTINAL ENDOSCOPY     Social History   Social History Narrative   Social Hx:   Current living situation- lives in [place] alone   Born in [place] and raised in [place] by mom and dad   Siblings 3 living siblings but total 5 (pt is number the youngest)   Schooling- HS grad   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 Right Handed    Are you currently employed ? No    What is your current occupation? Retired  Do you live at home alone? Yes   Who lives with you?    What type of home do you live in: 1 story or 2 story? Lives in one story home        Immunization History  Administered Date(s) Administered   Fluzone Influenza virus vaccine,trivalent (IIV3), split virus 01/04/2013, 03/25/2014, 02/09/2020   Hep B, Unspecified 06/21/2018, 07/21/2018   Hepatitis B, ADULT 06/20/2018   Hepb-cpg 06/21/2018, 07/21/2018   Influenza Inj Mdck Quad Pf 02/22/2018, 02/25/2018   Influenza Split 03/10/2012   Influenza Whole 04/22/2007, 03/08/2009   Influenza, Seasonal, Injecte, Preservative Fre 04/02/2021   Influenza,inj,Quad PF,6+ Mos 03/16/2018   PFIZER(Purple Top)SARS-COV-2 Vaccination 07/21/2019, 08/11/2019, 04/15/2020   Polio, Unspecified 11/22/1958, 01/17/1959, 08/22/1959, 10/21/1967   Smallpox 01/31/1959   Td 06/27/2008   Td (Adult),unspecified 10/21/1967   Tdap 06/27/2008, 11/12/2020   Zoster Recombinant(Shingrix) 11/12/2020, 02/07/2021     Objective: Vital Signs: BP 133/75 (BP Location: Left Arm, Patient Position: Sitting, Cuff Size: Normal)   Pulse 71   Resp 14   Ht 5' 3 (1.6 m)   Wt 150 lb 3.2 oz (68.1 kg)   BMI 26.61 kg/m    Physical Exam Vitals and nursing note reviewed.  Constitutional:      Appearance: She is well-developed.  HENT:     Head: Normocephalic and  atraumatic.  Eyes:     Conjunctiva/sclera: Conjunctivae normal.  Cardiovascular:     Rate and Rhythm: Normal rate and regular rhythm.     Heart sounds: Normal heart sounds.  Pulmonary:     Effort: Pulmonary effort is normal.     Breath sounds: Normal breath sounds.  Abdominal:     General: Bowel sounds are normal.     Palpations: Abdomen is soft.  Musculoskeletal:     Cervical back: Normal range of motion.  Lymphadenopathy:     Cervical: No cervical adenopathy.  Skin:    General: Skin is warm and dry.     Capillary Refill: Capillary refill takes less than 2 seconds.  Neurological:     Mental Status: She is alert and oriented to person, place, and time.  Psychiatric:        Behavior: Behavior normal.      Musculoskeletal Exam: Cervical spine was in good range of motion.  She had no tenderness over thoracic or lumbar spine.  Shoulders, elbows and wrist joints were in good range of motion without any synovitis.  She had bilateral PIP and DIP thickening.  Bilateral CMC thickening was noted.  Phalen's and Tinel's was negative.  Hip joints with good range of motion.  She had tenderness over bilateral trochanteric bursa.  Knee joints were in good range of motion without any warmth swelling or effusion.  There was no tenderness over her ankles or MTPs.  CDAI Exam: CDAI Score: -- Patient Global: --; Provider Global: -- Swollen: --; Tender: -- Joint Exam 12/28/2023   No joint exam has been documented for this visit   There is currently no information documented on the homunculus. Go to the Rheumatology activity and complete the homunculus joint exam.  Investigation: No additional findings.  Imaging: No results found.  Recent Labs: Lab Results  Component Value Date   WBC 15.1 (H) 12/04/2023   HGB 15.7 (H) 12/04/2023   PLT 283 12/04/2023   NA 140 12/04/2023   K 3.7 12/04/2023   CL 104 12/04/2023   CO2 26 12/04/2023   GLUCOSE 151 (H) 12/04/2023   BUN 9 12/04/2023  CREATININE 0.66 12/04/2023   BILITOT 1.1 12/04/2023   ALKPHOS 111 12/04/2023   AST 18 12/04/2023   ALT 20 12/04/2023   PROT 7.8 12/04/2023   ALBUMIN 3.8 12/04/2023   CALCIUM  9.1 12/04/2023   GFRAA 94 06/07/2020    Speciality Comments: No specialty comments available.  Procedures:  No procedures performed Allergies: Gabapentin, Metformin and related, Sulfa antibiotics, Sulfamethoxazole, and Other   Assessment / Plan:     Visit Diagnoses: Subacromial bursitis of left shoulder joint-improved doing better.,  Primary osteoarthritis of both hands -she had bilateral PIP and DIP thickening.  No synovitis was noted.  Joint protection was.  He was discussed.  X-rays in the past showed early osteoarthritic changes.  Autoimmune workup was negative.  Trochanteric bursitis, left hip-she has been having discomfort over bilateral trochanteric bursa more prominent on the left side.  I would avoid cortisone injection.  A handout on IT band stretches was given.  I will refer her to physical therapy.  She had good response to physical therapy in the past.  Primary osteoarthritis of both feet -she continues to have some discomfort in her feet.  X-rays in the past showed early osteoarthritic changes in calcaneal spurs.  Fibromyalgia - History of generalized pain, hyperalgesia and positive tender points for many years.  Need for regular exercise and stretching was discussed.  Intolerance to pregabalin  in the past.  Family history of rheumatoid arthritis-maternal aunts  Other microform solution as follows:  Insomnia due to other mental disorder  Type 2 diabetes mellitus without complication, without long-term current use of insulin (HCC)  Paresthesia of both feet  History of gastroesophageal reflux (GERD)  Hx of colonic polyps  Dyslipidemia  History of hypothyroidism  History of depression  Tobacco use - 1/4 PPD X25 years  Orders: No orders of the defined types were placed in this  encounter.  No orders of the defined types were placed in this encounter.    Follow-Up Instructions: Return in about 6 months (around 06/29/2024) for Osteoarthritis.   Maya Nash, MD  Note - This record has been created using Animal nutritionist.  Chart creation errors have been sought, but may not always  have been located. Such creation errors do not reflect on  the standard of medical care.

## 2023-12-15 NOTE — Progress Notes (Unsigned)
 Assessment & Plan:  Melissa Harmon was seen today for hospitalization follow-up.  Diagnoses and all orders for this visit:  Constipation due to slow transit -     LINZESS  145 MCG CAPS capsule; Take 1 capsule (145 mcg total) by mouth 2 (two) times daily. Chronic constipation with recent fecal impaction, persistent despite Linzess  145 mcg daily. - Prescribe Linzess  145 mcg twice daily. - Encourage adequate hydration. - Advise continued use of over-the-counter stool softeners as needed. - Instruct to contact GI for an earlier appointment if needed.  Gastroesophageal reflux disease (GERD) GERD managed with pantoprazole . Financial difficulty affording sucralfate . - Continue pantoprazole  daily. - Use sucralfate  as needed for swallowing difficulties.  Patient has been counseled on age-appropriate routine health concerns for screening and prevention. These are reviewed and up-to-date. Referrals have been placed accordingly. Immunizations are up-to-date or declined.    Subjective:   Chief Complaint  Patient presents with   Hospitalization Follow-up    History of Present Illness Melissa Harmon is a 68 year old female who presents  for HFU   Melissa Harmon was seen in the ED on 12-04-2023 for odynophagia and constipation.  GI was consulted and recommended octreotide IV and then she was able to tolerate carbonated beverage and crackers without difficulty.  She was discharged with Carafate  3-4 times a day, PPI 40 mg daily with GI follow-up.  Today she reports prior to being seen in the emergency room she had been experiencing significant constipation and difficulty swallowing, with an inability to digest food properly, feeling 'impacted' with food backing up and causing throat soreness.  She had previously been prescribed Linzess  145 mg but did not feel it was effective.  She denies nausea, vomiting, or blood in her stool.  She is currently on pantoprazole  daily and taking sucralfate  as needed for swallowing  difficulties.  She would like to increase her Linzess  at this time until she is seen by GI.   She no longer drinks beer, which she feels has led to increased thirst and dry mouth.    Review of Systems  Constitutional:  Negative for fever, malaise/fatigue and weight loss.  HENT: Negative.  Negative for nosebleeds.   Eyes: Negative.  Negative for blurred vision, double vision and photophobia.  Respiratory: Negative.  Negative for cough and shortness of breath.   Cardiovascular: Negative.  Negative for chest pain, palpitations and leg swelling.  Gastrointestinal:  Positive for constipation and heartburn. Negative for abdominal pain, blood in stool, diarrhea, melena, nausea and vomiting.  Musculoskeletal: Negative.  Negative for myalgias.  Neurological: Negative.  Negative for dizziness, focal weakness, seizures and headaches.  Psychiatric/Behavioral: Negative.  Negative for suicidal ideas.     Past Medical History:  Diagnosis Date   Bronchitis    Carpal tunnel syndrome on both sides    Family history of breast cancer    Family history of colonic polyps    Family history of lung cancer    Family history of ovarian cancer    Family history of stomach cancer    Fibromyalgia    GERD (gastroesophageal reflux disease)    History of chronic bronchitis    History of Hashimoto thyroiditis    s/p  bilateral thryoidectomy 03/ 2001   Hyperlipidemia    Hypothyroidism, postsurgical followed by pcp   08-04-1999 s/p  bilateral thyroidectomy for multinodular goitar (per path hashimoto's thyroiditis)   Irritable bowel syndrome with constipation    MDD (major depressive disorder)    Pelvic pain  Right ovarian cyst    Type 2 diabetes mellitus (HCC)    followed by pcp  (08-24-2019 pt stated checks blood sugar dialy in am,  fasting-- 159-175)   Wears contact lenses    Wears dentures    upper    Past Surgical History:  Procedure Laterality Date   CESAREAN SECTION  1986   COLONOSCOPY  last  one 05-25-2018   LAPAROSCOPIC UNILATERAL SALPINGO OOPHERECTOMY Bilateral 09/01/2019   Procedure: LAPAROSCOPIC BILATERAL SALPINGO OOPHORECTOMY;  Surgeon: Delana Ted Morrison, DO;  Location: [facility];  Service: Gynecology;  Laterality: Bilateral;   THYROIDECTOMY Bilateral 08-04-1999  dr adel  @[facility]    subtotal   UPPER GASTROINTESTINAL ENDOSCOPY      Family History  Problem Relation Age of Onset   Diabetes Mother    Heart disease Mother    Stroke Mother    Colon cancer Father    Bipolar disorder Father    Colon polyps Father        unknown number   Ovarian cancer Sister 17       or other gynecologic cancer   Schizophrenia Brother    Heart disease Brother    Diverticulitis Maternal Aunt    Stomach cancer Maternal Grandmother        dx ?   Heart attack Maternal Grandmother    Lung cancer Cousin        dx 35s (maternal first cousin)   Breast cancer Cousin        dx 60s/70s (maternal first cousin)   Heart disease Half-Brother    Colon polyps Other 25       >80 polyps (6th degree relative - mother's cousin's grandson)   Esophageal cancer Neg Hx    Rectal cancer Neg Hx     Social History Reviewed with no changes to be made today.   Outpatient Medications Prior to Visit  Medication Sig Dispense Refill   Accu-Chek Softclix Lancets lancets Check blood glucose level by fingerstick twice per day. E11.65 100 each 3   albuterol  (PROAIR  HFA) 108 (90 Base) MCG/ACT inhaler Inhale 2 puffs into the lungs every 4 (four) hours as needed for wheezing or shortness of breath. 18 g 0   albuterol  (VENTOLIN  HFA) 108 (90 Base) MCG/ACT inhaler Inhale 2 puffs into the lungs every 6 (six) hours as needed for wheezing. 18 g 1   ammonium lactate  (AMLACTIN) 12 % lotion Apply 1 Application topically as needed for dry skin. 400 g 0   atorvastatin  (LIPITOR) 40 MG tablet Take 1 tablet (40 mg total) by mouth daily. 90 tablet 3   Blood Glucose Monitoring Suppl (ACCU-CHEK GUIDE ME) w/Device KIT  Check blood glucose level by fingerstick twice per day. E11.65 1 kit 0   celecoxib  (CELEBREX ) 100 MG capsule Take 1 capsule (100 mg total) by mouth 2 (two) times daily. For hip pain 60 capsule 0   cetirizine  (ZYRTEC ) 10 MG tablet Take 1 tablet (10 mg total) by mouth daily. 90 tablet 1   docusate sodium  (COLACE) 100 MG capsule Take 1 capsule (100 mg total) by mouth 2 (two) times daily as needed for mild constipation. 10 capsule 1   empagliflozin  (JARDIANCE ) 25 MG TABS tablet Take 1 tablet (25 mg total) by mouth daily before breakfast. 90 tablet 1   folic acid  (FOLVITE ) 1 MG tablet Take 1 tablet (1 mg total) by mouth daily. 30 tablet 5   glimepiride  (AMARYL ) 1 MG tablet Take 1 tablet (1 mg total) by mouth daily  with breakfast. 180 tablet 1   glucagon  1 MG injection Inject 0.5 mg into the vein once as needed for up to 1 dose. 1 each 12   glucose blood (ACCU-CHEK GUIDE TEST) test strip Check blood glucose level by fingerstick twice per day. E11.65 100 each 12   Lancet Devices (ONE TOUCH DELICA LANCING DEV) MISC Inject into the skin twice daily E11.65 1 each prn   levothyroxine  (SYNTHROID ) 175 MCG tablet Take 1 tablet (175 mcg total) by mouth daily. 30 tablet 2   Lidocaine  5 % CREA Apply topically as prescribed 30 g 1   pantoprazole  (PROTONIX ) 40 MG tablet Take 1 tablet (40 mg total) by mouth daily. 90 tablet 1   sucralfate  (CARAFATE ) 1 g tablet Take 1 tablet (1 g total) by mouth 4 (four) times daily -  with meals and at bedtime. 120 tablet 0   triamcinolone  (KENALOG ) 0.025 % ointment Apply 1 Application topically 2 (two) times daily. 60 g 1   LINZESS  145 MCG CAPS capsule Take 1 capsule (145 mcg total) by mouth daily. (Patient not taking: Reported on 12/15/2023) 30 capsule 3   Facility-Administered Medications Prior to Visit  Medication Dose Route Frequency Provider Last Rate Last Admin   0.9 %  sodium chloride  infusion  500 mL Intravenous Once Nandigam, Kavitha V, MD        Allergies  Allergen  Reactions   Gabapentin Other (See Comments)    Other Reaction(s): memory changes   Metformin And Related Other (See Comments)    GI symptoms; Constipation   Sulfa Antibiotics     Other Reaction(s): hives   Sulfamethoxazole Hives   Other Rash       Objective:    BP 110/69 (BP Location: Right Arm, Patient Position: Sitting, Cuff Size: Normal)   Pulse 77   Resp 19   Ht 5' 3 (1.6 m)   Wt 153 lb 6.4 oz (69.6 kg)   SpO2 100%   BMI 27.17 kg/m  Wt Readings from Last 3 Encounters:  12/15/23 153 lb 6.4 oz (69.6 kg)  12/04/23 155 lb (70.3 kg)  11/09/23 153 lb 12.8 oz (69.8 kg)    Physical Exam Vitals and nursing note reviewed.  Constitutional:      Appearance: She is well-developed.  HENT:     Head: Normocephalic and atraumatic.  Cardiovascular:     Rate and Rhythm: Normal rate and regular rhythm.     Heart sounds: Normal heart sounds. No murmur heard.    No friction rub. No gallop.  Pulmonary:     Effort: Pulmonary effort is normal. No tachypnea or respiratory distress.     Breath sounds: Normal breath sounds. No decreased breath sounds, wheezing, rhonchi or rales.  Chest:     Chest wall: No tenderness.  Abdominal:     General: Bowel sounds are normal.     Palpations: Abdomen is soft.  Musculoskeletal:        General: Normal range of motion.     Cervical back: Normal range of motion.  Skin:    General: Skin is warm and dry.  Neurological:     Mental Status: She is alert and oriented to person, place, and time.     Coordination: Coordination normal.  Psychiatric:        Behavior: Behavior normal. Behavior is cooperative.        Thought Content: Thought content normal.        Judgment: Judgment normal.  Patient has been counseled extensively about nutrition and exercise as well as the importance of adherence with medications and regular follow-up. The patient was given clear instructions to go to ER or return to medical center if symptoms don't improve,  worsen or new problems develop. The patient verbalized understanding.   Follow-up: Return in 2 months (on 02/18/2024).   Haze LELON Servant, FNP-BC [facility] [place], [place] [phone]   12/16/2023, 10:18 PM

## 2023-12-16 ENCOUNTER — Encounter: Payer: Self-pay | Admitting: Nurse Practitioner

## 2023-12-21 ENCOUNTER — Other Ambulatory Visit: Payer: Self-pay

## 2023-12-21 ENCOUNTER — Telehealth: Payer: Self-pay

## 2023-12-21 NOTE — Telephone Encounter (Signed)
 Pharmacy Patient Advocate Encounter   Received notification from CoverMyMeds that prior authorization for LINZESS  is required/requested.   Insurance verification completed.   The patient is insured through [employer] .   Per test claim: PA required; PA submitted to above mentioned insurance via CoverMyMeds Key/confirmation #/EOC [id number] Status is pending

## 2023-12-21 NOTE — Telephone Encounter (Signed)
 Pharmacy Patient Advocate Encounter   Received notification from CoverMyMeds that prior authorization for LINZESS  is required/requested.   Insurance verification completed.   The patient is insured through [employer] .   Per test claim: PA required; PA submitted to above mentioned insurance via CoverMyMeds Key/confirmation #/EOC [id number] Status is pending

## 2023-12-22 ENCOUNTER — Telehealth: Payer: Self-pay

## 2023-12-22 ENCOUNTER — Other Ambulatory Visit: Payer: Self-pay

## 2023-12-22 NOTE — Telephone Encounter (Signed)
 Pharmacy Patient Advocate Encounter  Received notification from [facility] that Prior Authorization for LINZESS  has been APPROVED from 12/21/2023 to 05/10/2024   PA #/Case ID/Reference #: [id number]

## 2023-12-23 ENCOUNTER — Other Ambulatory Visit: Payer: Self-pay

## 2023-12-28 ENCOUNTER — Other Ambulatory Visit: Payer: Self-pay | Admitting: Nurse Practitioner

## 2023-12-28 ENCOUNTER — Encounter: Payer: Self-pay | Admitting: Rheumatology

## 2023-12-28 ENCOUNTER — Ambulatory Visit: Payer: 59 | Attending: Rheumatology | Admitting: Rheumatology

## 2023-12-28 VITALS — BP 133/75 | HR 71 | Resp 14 | Ht 63.0 in | Wt 150.2 lb

## 2023-12-28 DIAGNOSIS — M19041 Primary osteoarthritis, right hand: Secondary | ICD-10-CM | POA: Diagnosis not present

## 2023-12-28 DIAGNOSIS — G5601 Carpal tunnel syndrome, right upper limb: Secondary | ICD-10-CM

## 2023-12-28 DIAGNOSIS — M797 Fibromyalgia: Secondary | ICD-10-CM

## 2023-12-28 DIAGNOSIS — L209 Atopic dermatitis, unspecified: Secondary | ICD-10-CM

## 2023-12-28 DIAGNOSIS — M19071 Primary osteoarthritis, right ankle and foot: Secondary | ICD-10-CM

## 2023-12-28 DIAGNOSIS — M7552 Bursitis of left shoulder: Secondary | ICD-10-CM | POA: Diagnosis not present

## 2023-12-28 DIAGNOSIS — Z8261 Family history of arthritis: Secondary | ICD-10-CM

## 2023-12-28 DIAGNOSIS — E785 Hyperlipidemia, unspecified: Secondary | ICD-10-CM

## 2023-12-28 DIAGNOSIS — Z860100 Personal history of colon polyps, unspecified: Secondary | ICD-10-CM

## 2023-12-28 DIAGNOSIS — E119 Type 2 diabetes mellitus without complications: Secondary | ICD-10-CM

## 2023-12-28 DIAGNOSIS — Z8719 Personal history of other diseases of the digestive system: Secondary | ICD-10-CM

## 2023-12-28 DIAGNOSIS — F5105 Insomnia due to other mental disorder: Secondary | ICD-10-CM

## 2023-12-28 DIAGNOSIS — Z8639 Personal history of other endocrine, nutritional and metabolic disease: Secondary | ICD-10-CM

## 2023-12-28 DIAGNOSIS — Z8659 Personal history of other mental and behavioral disorders: Secondary | ICD-10-CM

## 2023-12-28 DIAGNOSIS — R202 Paresthesia of skin: Secondary | ICD-10-CM

## 2023-12-28 DIAGNOSIS — M19072 Primary osteoarthritis, left ankle and foot: Secondary | ICD-10-CM

## 2023-12-28 DIAGNOSIS — M7062 Trochanteric bursitis, left hip: Secondary | ICD-10-CM | POA: Diagnosis not present

## 2023-12-28 DIAGNOSIS — Z720 Tobacco use: Secondary | ICD-10-CM

## 2023-12-28 DIAGNOSIS — M19042 Primary osteoarthritis, left hand: Secondary | ICD-10-CM

## 2023-12-28 DIAGNOSIS — F99 Mental disorder, not otherwise specified: Secondary | ICD-10-CM

## 2023-12-28 NOTE — Telephone Encounter (Unsigned)
 Copied from CRM #[id number]. Topic: Clinical - Medication Refill >> Dec 28, 2023  9:30 AM Emylou G wrote: Medication: atorvastatin  (LIPITOR) 40 MG tablet  Has the patient contacted their pharmacy? Yes (Agent: If no, request that the patient contact the pharmacy for the refill. If patient does not wish to contact the pharmacy document the reason why and proceed with request.) (Agent: If yes, when and what did the pharmacy advise?) said to call us   This is the patient's preferred pharmacy:  [facility] [address] [place], [place] - [address] [place] [address] Phone: [phone] Fax: [phone]  Is this the correct pharmacy for this prescription? Yes If no, delete pharmacy and type the correct one.   Has the prescription been filled recently? No  Is the patient out of the medication? Yes  Has the patient been seen for an appointment in the last year OR does the patient have an upcoming appointment? Yes  Can we respond through MyChart? Yes  Agent: Please be advised that Rx refills may take up to 3 business days. We ask that you follow-up with your pharmacy.

## 2023-12-28 NOTE — Addendum Note (Signed)
 Addended by: YVONE DAVED BROCKS on: 12/28/2023 10:39 AM   Modules accepted: Orders

## 2023-12-28 NOTE — Telephone Encounter (Unsigned)
 Copied from CRM #[id number]. Topic: Clinical - Medication Refill >> Dec 28, 2023  8:50 AM Essie A wrote: Medication: triamcinolone  (KENALOG ) 0.025 % ointment  Has the patient contacted their pharmacy? No, can't find the Rx number (Agent: If no, request that the patient contact the pharmacy for the refill. If patient does not wish to contact the pharmacy document the reason why and proceed with request.) (Agent: If yes, when and what did the pharmacy advise?)  This is the patient's preferred pharmacy:  [facility] [address] [place], [place] - [address] [place] [address] Phone: [phone] Fax: [phone]  Is this the correct pharmacy for this prescription? Yes If no, delete pharmacy and type the correct one.   Has the prescription been filled recently? Yes  Is the patient out of the medication? Yes  Has the patient been seen for an appointment in the last year OR does the patient have an upcoming appointment? Yes  Can we respond through MyChart? Yes  Agent: Please be advised that Rx refills may take up to 3 business days. We ask that you follow-up with your pharmacy.

## 2023-12-28 NOTE — Patient Instructions (Signed)
 Iliotibial Band Syndrome Rehab Ask your health care provider which exercises are safe for you. Do exercises exactly as told by your provider and adjust them as told. It's normal to feel mild stretching, pulling, tightness, or discomfort as you do these exercises. Stop right away if you feel sudden pain or your pain gets a lot worse. Do not begin these exercises until told by your provider. Stretching and range-of-motion exercises These exercises warm up your muscles and joints. They also improve the movement and flexibility of your hip and pelvis. Quadriceps stretch, prone  Lie face down (prone) on a firm surface like a bed or padded floor. Bend your left / right knee. Reach back to hold your ankle or pant leg. If you can't reach your ankle or pant leg, use a belt looped around your foot and grab the belt instead. Gently pull your heel toward your butt. Your knee should not slide out to the side. You should feel a stretch in the front of your thigh and knee, also called the quadriceps. Hold this position for __________ seconds. Repeat __________ times. Complete this exercise __________ times a day. Iliotibial band stretch The iliotibial band is a strip of tissue that runs along the outside of your hip down to your knee. Lie on your side with your left / right leg on top. Bend both knees and grab your left / right ankle. Stretch out your bottom arm to help you balance. Slowly bring your top knee back so your thigh goes behind your back. Slowly lower your top leg toward the floor until you feel a gentle stretch on the outside of your left / right hip and thigh. If you don't feel a stretch and your knee won't go farther, place the heel of your other foot on top of your knee and pull your knee down toward the floor with your foot. Hold this position for __________ seconds. Repeat __________ times. Complete this exercise __________ times a day. Strengthening exercises These exercises build strength  and endurance in your hip and pelvis. Endurance means your muscles can keep working even when they're tired. Straight leg raises, side-lying This exercise strengthens the muscles that rotate the leg at the hip and move it away from your body. These muscles are called hip abductors. Lie on your side with your left / right leg on top. Lie so your head, shoulder, hip, and knee line up. You can bend your bottom knee to help you balance. Roll your hips slightly forward so they're stacked directly over each other. Your left / right knee should face forward. Tense the muscles in your outer thigh and hip. Lift your top leg 4-6 inches (10-15 cm) off the ground. Hold this position for __________ seconds. Slowly lower your leg back down to the starting position. Let your muscles fully relax before doing this exercise again. Repeat __________ times. Complete this exercise __________ times a day. Leg raises, prone This exercise strengthens the muscles that move the hips backward. These muscles are called hip extensors. Lie face down (prone) on your bed or a firm surface. You can put a pillow under your hips for comfort and to support your lower back. Bend your left / right knee so your foot points straight up toward the ceiling. Keep the other leg straight and behind you. Squeeze your butt muscles. Lift your left / right thigh off the firm surface. Do not let your back arch. Tense your thigh muscle as hard as you can without having  more knee pain. Hold this position for __________ seconds. Slowly lower your leg to the starting position. Allow your leg to relax all the way. Repeat __________ times. Complete this exercise __________ times a day. Hip hike  Stand sideways on a bottom step. Place your feet so that your left / right leg is on the step, and the other foot is hanging off the side. If you need support for balance, hold onto a railing or wall. Keep your knees straight and your abdomen square,  meaning your hips are level. Then, lift your left / right hip up toward the ceiling. Slowly let your leg that's hanging off the step lower towards the floor. Your foot should get closer to the ground. Do not lean or bend your knees during this movement. Repeat __________ times. Complete this exercise __________ times a day. This information is not intended to replace advice given to you by your health care provider. Make sure you discuss any questions you have with your health care provider. Document Revised: 07/10/2022 Document Reviewed: 07/10/2022 Elsevier Patient Education  2024 [employer].

## 2023-12-29 MED ORDER — ATORVASTATIN CALCIUM 40 MG PO TABS: 40.0000 mg | ORAL_TABLET | Freq: Every day | ORAL | 3 refills | Status: DC

## 2023-12-29 MED ORDER — TRIAMCINOLONE ACETONIDE 0.025 % EX OINT: 1.0000 | TOPICAL_OINTMENT | Freq: Two times a day (BID) | CUTANEOUS | 1 refills | Status: DC

## 2023-12-29 NOTE — Telephone Encounter (Signed)
 Requested medication (s) are due for refill today - unsure  Requested medication (s) are on the active medication list -yes  Future visit scheduled -yes  Last refill: 01/12/23 60g 1RF  Notes to clinic: non delegated Rx  Requested Prescriptions  Pending Prescriptions Disp Refills   triamcinolone  (KENALOG ) 0.025 % ointment 60 g 1    Sig: Apply 1 Application topically 2 (two) times daily.     Not Delegated - Dermatology:  Corticosteroids Failed - 12/29/2023  3:32 PM      Failed - This refill cannot be delegated      Passed - Valid encounter within last 12 months    Recent Outpatient Visits           2 weeks ago Constipation due to slow transit   [place] [facility] [place] - [facility] Dept Of [place]. [facility] Theotis Haze ORN, NP   1 month ago Encounter for [employer] annual wellness exam   [place] [facility] [place] - A Dept Of [place]. [facility] Theotis Haze ORN, NP   4 months ago Left hip pain   [place] [facility] [place] - A Dept Of [place]. [facility] Theotis Haze ORN, NP   8 months ago Diabetes mellitus treated with oral medication [facility])   [place] [facility] [place] - A Dept Of [place]. [facility], [place] W, NP   11 months ago Type 2 diabetes mellitus with hyperglycemia, without long-term current use of insulin (HCC)   [place] [facility] - A Dept Of [place]. [facility] Theotis Haze ORN, NP       Future Appointments             In 1 month Theotis Haze ORN, NP [facility] [place] - A Dept Of [place]. [facility]   In 6 months Deveshwar, Maya, MD [facility] Health Rheumatology - A Dept Of [place]. [facility]               Requested Prescriptions  Pending Prescriptions Disp Refills   triamcinolone  (KENALOG ) 0.025 % ointment 60 g 1    Sig: Apply 1 Application topically 2 (two) times daily.     Not Delegated - Dermatology:  Corticosteroids Failed  - 12/29/2023  3:32 PM      Failed - This refill cannot be delegated      Passed - Valid encounter within last 12 months    Recent Outpatient Visits           2 weeks ago Constipation due to slow transit   [place] [facility] [place] - A Dept Of [place]. [facility] Theotis Haze ORN, NP   1 month ago Encounter for [employer] annual wellness exam   [place] [facility] [place] - A Dept Of [place]. [facility] Theotis Haze ORN, NP   4 months ago Left hip pain   [place] [facility] [place] - A Dept Of [place]. [facility] Theotis Haze ORN, NP   8 months ago Diabetes mellitus treated with oral medication [facility])   [place] [facility] - A Dept Of [place]. [facility] [place], [place] W, NP   11 months ago Type 2 diabetes mellitus with hyperglycemia, without long-term current use of insulin (HCC)    [facility] - A Dept Of [place]. [facility] [place], [name]  W, NP       Future Appointments             In 1 month Theotis Haze ORN, NP  [facility] - A Dept Of [place]. [facility]   In 6 months Deveshwar, Maya, MD [facility] Health Rheumatology - A Dept Of [place]. [facility]

## 2023-12-29 NOTE — Telephone Encounter (Signed)
 Requested medication (s) are due for refill today - yes  Requested medication (s) are on the active medication list -yes  Future visit scheduled -yes  Last refill: 11/19/22 #90 3RF  Notes to clinic: fails lab protocol- over 1 year- 05/20/2009  Requested Prescriptions  Pending Prescriptions Disp Refills   atorvastatin  (LIPITOR) 40 MG tablet 90 tablet 3    Sig: Take 1 tablet (40 mg total) by mouth daily.     Cardiovascular:  Antilipid - Statins Failed - 12/29/2023  3:40 PM      Failed - Lipid Panel in normal range within the last 12 months    Cholesterol  Date Value Ref Range Status  05/20/2009 257 (H) 0 - 200 mg/dL Final    Comment:    See lab report for associated comment(s)   LDL Cholesterol  Date Value Ref Range Status  05/20/2009 185 (H) 0 - 99 mg/dL Final    Comment:    See lab report for associated comment(s)   Direct LDL  Date Value Ref Range Status  08/23/2009 124 (H) mg/dL Final    Comment:    See lab report for associated comment(s)   HDL  Date Value Ref Range Status  05/20/2009 47 >39 mg/dL Final   Triglycerides  Date Value Ref Range Status  05/20/2009 127 <150 mg/dL Final         Passed - Patient is not pregnant      Passed - Valid encounter within last 12 months    Recent Outpatient Visits           2 weeks ago Constipation due to slow transit   [place] [facility] [place] - [facility] Dept Of [place]. [facility] Theotis Haze ORN, NP   1 month ago Encounter for [employer] annual wellness exam   [place] [facility] [place] - A Dept Of [place]. [facility] Theotis Haze ORN, NP   4 months ago Left hip pain   [place] [facility] - A Dept Of [place]. [facility] Theotis Haze ORN, NP   8 months ago Diabetes mellitus treated with oral medication [facility])   [place] [facility] - A Dept Of [place]. [facility] [place], [place] W, NP   11 months ago Type 2 diabetes mellitus with hyperglycemia,  without long-term current use of insulin (HCC)    [facility] - A Dept Of [place]. [facility] Theotis Haze ORN, NP       Future Appointments             In 1 month Theotis Haze ORN, NP [facility] Health [facility] - A Dept Of [place]. [facility]   In 6 months Deveshwar, Maya, MD [facility] Health Rheumatology - A Dept Of [place]. [facility]               Requested Prescriptions  Pending Prescriptions Disp Refills   atorvastatin  (LIPITOR) 40 MG tablet 90 tablet 3    Sig: Take 1 tablet (40 mg total) by mouth daily.     Cardiovascular:  Antilipid - Statins Failed - 12/29/2023  3:40 PM      Failed - Lipid Panel in normal range within the last 12 months    Cholesterol  Date Value Ref Range Status  05/20/2009 257 (H) 0 - 200 mg/dL Final    Comment:    See lab report for associated comment(s)  LDL Cholesterol  Date Value Ref Range Status  05/20/2009 185 (H) 0 - 99 mg/dL Final    Comment:    See lab report for associated comment(s)   Direct LDL  Date Value Ref Range Status  08/23/2009 124 (H) mg/dL Final    Comment:    See lab report for associated comment(s)   HDL  Date Value Ref Range Status  05/20/2009 47 >39 mg/dL Final   Triglycerides  Date Value Ref Range Status  05/20/2009 127 <150 mg/dL Final         Passed - Patient is not pregnant      Passed - Valid encounter within last 12 months    Recent Outpatient Visits           2 weeks ago Constipation due to slow transit   [place] [facility] [place] - A Dept Of [place]. [facility] Theotis Haze ORN, NP   1 month ago Encounter for [employer] annual wellness exam   [place] [facility] [place] - A Dept Of [place]. [facility] Theotis Haze ORN, NP   4 months ago Left hip pain   [place] [facility] [place] - A Dept Of [place]. [facility] Theotis Haze ORN, NP   8 months ago Diabetes mellitus treated with oral  medication [facility])   [place] [facility] - A Dept Of [place]. [facility] [place], [place] W, NP   11 months ago Type 2 diabetes mellitus with hyperglycemia, without long-term current use of insulin (HCC)    [facility] - A Dept Of [place]. [facility] Theotis Haze ORN, NP       Future Appointments             In 1 month Theotis Haze ORN, NP [facility] - A Dept Of [place]. [facility]   In 6 months Deveshwar, Maya, MD [facility] Health Rheumatology - A Dept Of . [facility]

## 2024-01-03 ENCOUNTER — Telehealth: Payer: Self-pay

## 2024-01-03 NOTE — Telephone Encounter (Signed)
 Patient identified by name and date of birth.  Patient states that the food she eats won't digest.  Patient states that she also experiences vaginal itching when taking this medication. Please advise, thank you.

## 2024-01-03 NOTE — Telephone Encounter (Signed)
 Copied from CRM #[id number]. Topic: Clinical - Prescription Issue >> Jan 03, 2024 12:37 PM Carlatta H wrote: Reason for CRM: Patient is having side effects to empagliflozin  (JARDIANCE ) 25 MG TABS tablet [[id number]]//She has not taking the medication in 2 days and is feeling better//Please call to advise

## 2024-01-04 ENCOUNTER — Other Ambulatory Visit: Payer: Self-pay

## 2024-01-04 DIAGNOSIS — E119 Type 2 diabetes mellitus without complications: Secondary | ICD-10-CM

## 2024-01-04 MED ORDER — GLIMEPIRIDE 2 MG PO TABS: 2.0000 mg | ORAL_TABLET | Freq: Every day | ORAL | 0 refills | Status: DC

## 2024-01-04 MED ORDER — GLIMEPIRIDE 2 MG PO TABS: 2.0000 mg | ORAL_TABLET | Freq: Every day | ORAL | 1 refills | Status: DC

## 2024-01-04 NOTE — Telephone Encounter (Signed)
 She can call her GI Dr. Nandigam about her digestive concerns. We will stop jardiance  at this time. Increase glimepiride  from 1mg  to 2 mg at this time.

## 2024-01-04 NOTE — Telephone Encounter (Signed)
 Patient identified by name and date of birth.  Patient aware of response from provider and voiced understanding.

## 2024-01-06 ENCOUNTER — Telehealth: Payer: Self-pay

## 2024-01-06 NOTE — Telephone Encounter (Signed)
 Patient called, left VM to return the call to the office to speak to a nurse.   Copied from CRM [id number]. Topic: Clinical - Medication Question >> Jan 06, 2024  4:19 PM Yolanda T wrote: Reason for CRM: patient needs a call back as to how she should be taking the glimepiride  (AMARYL ) 2 MG tablet. Patient said she thought her provider told her to take 1-2 at breakfast but she wants to be sure. Please f/u with patient

## 2024-01-07 NOTE — Telephone Encounter (Signed)
 Lvm for patient informing her that I have sent a MyChart message regarding her medication dosage.

## 2024-01-17 ENCOUNTER — Ambulatory Visit

## 2024-01-17 NOTE — Therapy (Deleted)
 OUTPATIENT PHYSICAL THERAPY LOWER EXTREMITY EVALUATION   Patient Name: Melissa Harmon MRN: [id number] DOB:1956-04-23, 68 y.o., female Today's Date: 01/17/2024  END OF SESSION:   Past Medical History:  Diagnosis Date   Bronchitis    Carpal tunnel syndrome on both sides    Family history of breast cancer    Family history of colonic polyps    Family history of lung cancer    Family history of ovarian cancer    Family history of stomach cancer    Fibromyalgia    GERD (gastroesophageal reflux disease)    History of chronic bronchitis    History of Hashimoto thyroiditis    s/p  bilateral thryoidectomy 03/ 2001   Hyperlipidemia    Hypothyroidism, postsurgical followed by pcp   08-04-1999 s/p  bilateral thyroidectomy for multinodular goitar (per path hashimoto's thyroiditis)   Irritable bowel syndrome with constipation    MDD (major depressive disorder)    Pelvic pain    Right ovarian cyst    Type 2 diabetes mellitus (HCC)    followed by pcp  (08-24-2019 pt stated checks blood sugar dialy in am,  fasting-- 159-175)   Wears contact lenses    Wears dentures    upper   Past Surgical History:  Procedure Laterality Date   CESAREAN SECTION  1986   COLONOSCOPY  last one 05-25-2018   LAPAROSCOPIC UNILATERAL SALPINGO OOPHERECTOMY Bilateral 09/01/2019   Procedure: LAPAROSCOPIC BILATERAL SALPINGO OOPHORECTOMY;  Surgeon: Delana Ted Morrison, DO;  Location: [facility];  Service: Gynecology;  Laterality: Bilateral;   THYROIDECTOMY Bilateral 08-04-1999  dr adel  @[facility]    subtotal   UPPER GASTROINTESTINAL ENDOSCOPY     Patient Active Problem List   Diagnosis Date Noted   Pain due to onychomycosis of toenails of both feet 05/15/2022   Anxiety 11/26/2021   Atopic dermatitis 11/26/2021   Irritable bowel syndrome with constipation 11/26/2021   Type 2 diabetes mellitus with hyperglycemia (HCC) 11/26/2021   Unspecified condition associated with female genital organs and  menstrual cycle 11/26/2021   Diabetes mellitus with neuropathy (HCC) 06/07/2020   Diabetic neuropathy (HCC) 06/07/2020   Genetic testing 05/22/2020   Family history of colonic polyps    Family history of ovarian cancer    Family history of stomach cancer    Family history of lung cancer    Family history of breast cancer    Adjustment disorder with anxious mood 09/18/2017   Insomnia due to other mental disorder 09/18/2017   DYSURIA 10/25/2009   ABDOMINAL PAIN, RIGHT UPPER QUADRANT 10/25/2009   NECK PAIN, LEFT 08/23/2009   HAND PAIN, RIGHT 08/23/2009   ELBOW PAIN, BILATERAL 05/17/2009   Tobacco user 05/13/2009   DIABETES MELLITUS, TYPE II, WITHOUT COMPLICATIONS 12/14/2008   DYSLIPIDEMIA 12/14/2008   SHORTNESS OF BREATH 12/14/2008   RECTAL BLEEDING 04/22/2007   History of colonic polyps 04/22/2007   BLOOD IN STOOL 04/15/2007   PITYRIASIS ROSEA 08/09/2006   HYPOTHYROIDISM, UNSPECIFIED 07/08/2006   Esophageal reflux 07/08/2006   FIBROMYALGIA, FIBROMYOSITIS 07/08/2006    PCP: Theotis Haze ORN, NP   REFERRING PROVIDER: Dolphus Reiter, MD  REFERRING DIAG: M70.62 (ICD-10-CM) - Trochanteric bursitis, left hip  THERAPY DIAG:  No diagnosis found.  Rationale for Evaluation and Treatment: Rehabilitation  ONSET DATE: chronic  SUBJECTIVE:   SUBJECTIVE STATEMENT: Melissa Harmon is a 68 y.o. female with fibromyalgia and osteoarthritis.  She returns today after her last visit in February 2025.  She states recently she has been having pain and  discomfort in her leg muscles.  She denies any muscle weakness.  She states her shoulder joints are better.  Continues to have some discomfort in her hands and feet.  He continues to have pain and discomfort in her left trochanteric bursa.  She has generalized pain and discomfort from fibromyalgia.  She takes Celebrex  on a as needed basis.  Patient states she was evaluated by neurology for paresthesias in the feet.  EMG and nerve conduction  velocities were unremarkable except for right carpal tunnel syndrome.  PERTINENT HISTORY: Trochanteric bursitis, left hip-she has been having discomfort over bilateral trochanteric bursa more prominent on the left side.  I would avoid cortisone injection.  A handout on IT band stretches was given.  I will refer her to physical therapy.  She had good response to physical therapy in the past. PAIN:  Are you having pain? {OPRCPAIN:27236}  PRECAUTIONS: None  RED FLAGS: None   WEIGHT BEARING RESTRICTIONS: No  FALLS:  Has patient fallen in last 6 months? No  OCCUPATION: ***  PLOF: Independent  PATIENT GOALS: To manage my hip pain  NEXT MD VISIT: TBD  OBJECTIVE:  Note: Objective measures were completed at Evaluation unless otherwise noted.  DIAGNOSTIC FINDINGS: none  PATIENT SURVEYS:  LEFS  Extreme difficulty/unable (0), Quite a bit of difficulty (1), Moderate difficulty (2), Little difficulty (3), No difficulty (4) Survey date:    Any of your usual work, housework or school activities   2. Usual hobbies, recreational or sporting activities   3. Getting into/out of the bath   4. Walking between rooms   5. Putting on socks/shoes   6. Squatting    7. Lifting an object, like a bag of groceries from the floor   8. Performing light activities around your home   9. Performing heavy activities around your home   10. Getting into/out of a car   11. Walking 2 blocks   12. Walking 1 mile   13. Going up/down 10 stairs (1 flight)   14. Standing for 1 hour   15.  sitting for 1 hour   16. Running on even ground   17. Running on uneven ground   18. Making sharp turns while running fast   19. Hopping    20. Rolling over in bed   Score total:  ***     MUSCLE LENGTH: Hamstrings: Right *** deg; Left *** deg Debby test: Right *** deg; Left *** deg  POSTURE: {posture:25561}  PALPATION: ***  LOWER EXTREMITY ROM:  {AROM/PROM:27142} ROM Right eval Left eval  Hip flexion     Hip extension    Hip abduction    Hip adduction    Hip internal rotation    Hip external rotation    Knee flexion    Knee extension    Ankle dorsiflexion    Ankle plantarflexion    Ankle inversion    Ankle eversion     (Blank rows = not tested)  LOWER EXTREMITY MMT:  MMT Right eval Left eval  Hip flexion    Hip extension    Hip abduction    Hip adduction    Hip internal rotation    Hip external rotation    Knee flexion    Knee extension    Ankle dorsiflexion    Ankle plantarflexion    Ankle inversion    Ankle eversion     (Blank rows = not tested)  LOWER EXTREMITY SPECIAL TESTS:  Hip special tests: Belvie (FABER) test: {pos/neg:25230},  Ober's test: {pos/neg:25230}, Hip scouring test: {pos/neg:25230}, and Anterior hip impingement test: {pos/neg:25230}  FUNCTIONAL TESTS:  30 seconds chair stand test  GAIT: Distance walked: *** Assistive device utilized: {Assistive devices:23999} Level of assistance: {Levels of assistance:24026} Comments: ***                                                                                                                                TREATMENT DATE: ***    PATIENT EDUCATION:  Education details: Discussed eval findings, rehab rationale and POC and patient is in agreement  Person educated: Patient Education method: Explanation and Handouts Education comprehension: verbalized understanding and needs further education  HOME EXERCISE PROGRAM: ***  ASSESSMENT:  CLINICAL IMPRESSION: Patient is a *** y.o. *** who was seen today for physical therapy evaluation and treatment for ***.   OBJECTIVE IMPAIRMENTS: {opptimpairments:25111}.   ACTIVITY LIMITATIONS: {activitylimitations:27494}  PERSONAL FACTORS: {Personal factors:25162} are also affecting patient's functional outcome.   REHAB POTENTIAL: Good  CLINICAL DECISION MAKING: Stable/uncomplicated  EVALUATION COMPLEXITY: Moderate   GOALS: Goals reviewed with patient?  No  SHORT TERM GOALS: Target date: *** Patient to demonstrate independence in HEP  Baseline: Goal status: INITIAL  2.  *** Baseline:  Goal status: INITIAL  3.  *** Baseline:  Goal status: INITIAL  4.  *** Baseline:  Goal status: INITIAL  5.  *** Baseline:  Goal status: INITIAL  6.  *** Baseline:  Goal status: INITIAL  LONG TERM GOALS: Target date: ***  Patient will acknowledge ***/10 pain at least once during episode of care   Baseline:  Goal status: INITIAL  2.  Patient will score at least ***% on FOTO to signify clinically meaningful improvement in functional abilities.   Baseline:  Goal status: INITIAL  3.  Patient will increase 30s chair stand reps from *** to *** with/without arms to demonstrate and improved functional ability with less pain/difficulty as well as reduce fall risk.  Baseline:  Goal status: INITIAL  4.  *** Baseline:  Goal status: INITIAL  5.  *** Baseline:  Goal status: INITIAL  6.  *** Baseline:  Goal status: INITIAL   PLAN:  PT FREQUENCY: 1-2x/week  PT DURATION: 6 weeks  PLANNED INTERVENTIONS: 97110-Therapeutic exercises, 97530- Therapeutic activity, 97112- Neuromuscular re-education, 97535- Self Care, 02859- Manual therapy, [phone]- Gait training, Patient/Family education, Balance training, and Stair training  PLAN FOR NEXT SESSION: HEP review and update, manual techniques as appropriate, aerobic tasks, ROM and flexibility activities, strengthening and PREs, TPDN, gait and balance training as needed     Reyes CHRISTELLA Kohut, PT 01/17/2024, 6:14 AM

## 2024-02-04 ENCOUNTER — Ambulatory Visit: Admitting: Physical Therapy

## 2024-02-09 ENCOUNTER — Ambulatory Visit: Admitting: Nurse Practitioner

## 2024-02-13 NOTE — Progress Notes (Unsigned)
 Melissa Harmon [id number] May 30, 1955   Chief Complaint:  Referring Provider: Theotis Haze ORN, NP Primary GI MD: dr. Shila  HPI: Melissa Harmon is a 68 y.o. female with past medical history of GERD, fibromyalgia, Hashimoto thyroiditis, s/p thyroidectomy, hypothyroidism, HLD, depression, IBS, T2DM, family history of colon polyps, family history of stomach cancer who presents today for a complaint of *** .    Last seen in office 10/07/2018 for evaluation of elevated liver enzymes.  At that time thought likely secondary to steatohepatitis and alcohol abuse.  Workup suggestive of possible autoimmune hepatitis.  Patient advised to abstain from alcohol and start prednisone  taper.  On follow-up labs LFTs returned to normal, advised to continue to abstain from alcohol.   Previous GI Procedures/Imaging   Colonoscopy 06/15/2022 - One 1 mm polyp in the cecum, removed with a cold biopsy forceps. Resected and retrieved.  - Three 4 to 6 mm polyps in the rectum, in the sigmoid colon and in the ascending colon, removed with a cold snare. Resected and retrieved.  - Diverticulosis in the sigmoid colon and in the descending colon.  - Non- bleeding internal hemorrhoids. - Recall 3 years Path: 1. Surgical [P], colon, ascending, cecum, polyp (2) - TUBULAR ADENOMA(S) - NEGATIVE FOR HIGH-GRADE DYSPLASIA OR MALIGNANCY 2. Surgical [P], colon, sigmoid, rectum, polyp (2) - TUBULAR ADENOMA(S) - NEGATIVE FOR HIGH-GRADE DYSPLASIA OR MALIGNANCY   Past Medical History:  Diagnosis Date   Bronchitis    Carpal tunnel syndrome on both sides    Family history of breast cancer    Family history of colonic polyps    Family history of lung cancer    Family history of ovarian cancer    Family history of stomach cancer    Fibromyalgia    GERD (gastroesophageal reflux disease)    History of chronic bronchitis    History of Hashimoto thyroiditis    s/p  bilateral thryoidectomy 03/ 2001   Hyperlipidemia     Hypothyroidism, postsurgical followed by pcp   08-04-1999 s/p  bilateral thyroidectomy for multinodular goitar (per path hashimoto's thyroiditis)   Irritable bowel syndrome with constipation    MDD (major depressive disorder)    Pelvic pain    Right ovarian cyst    Type 2 diabetes mellitus (HCC)    followed by pcp  (08-24-2019 pt stated checks blood sugar dialy in am,  fasting-- 159-175)   Wears contact lenses    Wears dentures    upper    Past Surgical History:  Procedure Laterality Date   CESAREAN SECTION  1986   COLONOSCOPY  last one 05-25-2018   LAPAROSCOPIC UNILATERAL SALPINGO OOPHERECTOMY Bilateral 09/01/2019   Procedure: LAPAROSCOPIC BILATERAL SALPINGO OOPHORECTOMY;  Surgeon: Delana Ted Morrison, DO;  Location: [facility];  Service: Gynecology;  Laterality: Bilateral;   THYROIDECTOMY Bilateral 08-04-1999  dr adel  @[facility]    subtotal   UPPER GASTROINTESTINAL ENDOSCOPY      Current Outpatient Medications  Medication Sig Dispense Refill   Accu-Chek Softclix Lancets lancets Check blood glucose level by fingerstick twice per day. E11.65 100 each 3   albuterol  (PROAIR  HFA) 108 (90 Base) MCG/ACT inhaler Inhale 2 puffs into the lungs every 4 (four) hours as needed for wheezing or shortness of breath. 18 g 0   albuterol  (VENTOLIN  HFA) 108 (90 Base) MCG/ACT inhaler Inhale 2 puffs into the lungs every 6 (six) hours as needed for wheezing. (Patient not taking: Reported on 12/28/2023) 18 g 1   ammonium lactate  (AMLACTIN)  12 % lotion Apply 1 Application topically as needed for dry skin. (Patient not taking: Reported on 12/28/2023) 400 g 0   atorvastatin  (LIPITOR) 40 MG tablet Take 1 tablet (40 mg total) by mouth daily. 90 tablet 3   Blood Glucose Monitoring Suppl (ACCU-CHEK GUIDE ME) w/Device KIT Check blood glucose level by fingerstick twice per day. E11.65 1 kit 0   celecoxib  (CELEBREX ) 100 MG capsule Take 1 capsule (100 mg total) by mouth 2 (two) times daily. For hip pain  (Patient taking differently: Take 100 mg by mouth as needed. For hip pain) 60 capsule 0   cetirizine  (ZYRTEC ) 10 MG tablet Take 1 tablet (10 mg total) by mouth daily. 90 tablet 1   docusate sodium  (COLACE) 100 MG capsule Take 1 capsule (100 mg total) by mouth 2 (two) times daily as needed for mild constipation. 10 capsule 1   empagliflozin  (JARDIANCE ) 25 MG TABS tablet Take 1 tablet (25 mg total) by mouth daily before breakfast. 90 tablet 1   folic acid  (FOLVITE ) 1 MG tablet Take 1 tablet (1 mg total) by mouth daily. 30 tablet 5   glimepiride  (AMARYL ) 2 MG tablet Take 1 tablet (2 mg total) by mouth daily with breakfast. 90 tablet 1   glucagon  1 MG injection Inject 0.5 mg into the vein once as needed for up to 1 dose. (Patient not taking: Reported on 12/28/2023) 1 each 12   glucose blood (ACCU-CHEK GUIDE TEST) test strip Check blood glucose level by fingerstick twice per day. E11.65 100 each 12   Lancet Devices (ONE TOUCH DELICA LANCING DEV) MISC Inject into the skin twice daily E11.65 1 each prn   levothyroxine  (SYNTHROID ) 175 MCG tablet Take 1 tablet (175 mcg total) by mouth daily. 30 tablet 2   Lidocaine  5 % CREA Apply topically as prescribed 30 g 1   LINZESS  145 MCG CAPS capsule Take 1 capsule (145 mcg total) by mouth 2 (two) times daily. 180 capsule 1   pantoprazole  (PROTONIX ) 40 MG tablet Take 1 tablet (40 mg total) by mouth daily. 90 tablet 1   sucralfate  (CARAFATE ) 1 g tablet Take 1 tablet (1 g total) by mouth 4 (four) times daily -  with meals and at bedtime. 120 tablet 0   triamcinolone  (KENALOG ) 0.025 % ointment Apply 1 Application topically 2 (two) times daily. 60 g 1   Current Facility-Administered Medications  Medication Dose Route Frequency Provider Last Rate Last Admin   0.9 %  sodium chloride  infusion  500 mL Intravenous Once Nandigam, Kavitha V, MD        Allergies as of 02/14/2024 - Review Complete 12/28/2023  Allergen Reaction Noted   Gabapentin Other (See Comments)  03/11/2020   Metformin and related Other (See Comments) 07/16/2020   Sulfa antibiotics  02/11/2023   Sulfamethoxazole Hives 12/10/2005   Other Rash 03/11/2020    Family History  Problem Relation Age of Onset   Diabetes Mother    Heart disease Mother    Stroke Mother    Colon cancer Father    Bipolar disorder Father    Colon polyps Father        unknown number   Ovarian cancer Sister 79       or other gynecologic cancer   Schizophrenia Brother    Heart disease Brother    Diverticulitis Maternal Aunt    Stomach cancer Maternal Grandmother        dx ?   Heart attack Maternal Grandmother    Lung cancer  Cousin        dx 2s (maternal first cousin)   Breast cancer Cousin        dx 64s/70s (maternal first cousin)   Heart disease Half-Brother    Colon polyps Other 25       >80 polyps (6th degree relative - mother's cousin's grandson)   Esophageal cancer Neg Hx    Rectal cancer Neg Hx     Social History   Tobacco Use   Smoking status: Every Day    Current packs/day: 0.25    Average packs/day: 0.3 packs/day for 25.0 years (6.3 ttl pk-yrs)    Types: Cigarettes    Passive exposure: Current   Smokeless tobacco: Never   Tobacco comments:    6 cig per day  Vaping Use   Vaping status: Never Used  Substance Use Topics   Alcohol use: Yes    Alcohol/week: 6.0 standard drinks of alcohol    Types: 6 Cans of beer per week    Comment: ocasional glass of wine   Drug use: Never     Review of Systems:    Constitutional: No weight loss, fever, chills, weakness or fatigue Eyes: No change in vision Ears, Nose, Throat:  No change in hearing or congestion Skin: No rash or itching Cardiovascular: No chest pain, chest pressure or palpitations   Respiratory: No SOB or cough Gastrointestinal: See HPI and otherwise negative Genitourinary: No dysuria or change in urinary frequency Neurological: No headache, dizziness or syncope Musculoskeletal: No new muscle or joint pain Hematologic:  No bleeding or bruising    Physical Exam:  Vital signs: There were no vitals taken for this visit.  Constitutional: NAD, Well developed, Well nourished, alert and cooperative Head:  Normocephalic and atraumatic.  Eyes: No scleral icterus. Conjunctiva pink. Mouth: No oral lesions. Respiratory: Respirations even and unlabored. Lungs clear to auscultation bilaterally.  No wheezes, crackles, or rhonchi.  Cardiovascular:  Regular rate and rhythm. No murmurs. No peripheral edema. Gastrointestinal:  Soft, nondistended, nontender. No rebound or guarding. Normal bowel sounds. No appreciable masses or hepatomegaly. Rectal:  Not performed.  Neurologic:  Alert and oriented x4;  grossly normal neurologically.  Skin:   Dry and intact without significant lesions or rashes. Psychiatric: Oriented to person, place and time. Demonstrates good judgement and reason without abnormal affect or behaviors.   RELEVANT LABS AND IMAGING: CBC    Component Value Date/Time   WBC 15.1 (H) 12/04/2023 0924   RBC 5.56 (H) 12/04/2023 0924   HGB 15.7 (H) 12/04/2023 0924   HGB 14.3 01/12/2023 1026   HCT 49.6 (H) 12/04/2023 0924   HCT 44.4 01/12/2023 1026   PLT 283 12/04/2023 0924   PLT 315 01/12/2023 1026   MCV 89.2 12/04/2023 0924   MCV 89 01/12/2023 1026   MCH 28.2 12/04/2023 0924   MCHC 31.7 12/04/2023 0924   RDW 14.1 12/04/2023 0924   RDW 12.8 01/12/2023 1026   LYMPHSABS 2.9 12/04/2023 0924   LYMPHSABS 3.2 (H) 01/12/2023 1026   MONOABS 0.9 12/04/2023 0924   EOSABS 0.1 12/04/2023 0924   EOSABS 0.1 01/12/2023 1026   BASOSABS 0.1 12/04/2023 0924   BASOSABS 0.1 01/12/2023 1026    CMP     Component Value Date/Time   NA 140 12/04/2023 0924   NA 142 11/15/2023 0908   K 3.7 12/04/2023 0924   CL 104 12/04/2023 0924   CO2 26 12/04/2023 0924   GLUCOSE 151 (H) 12/04/2023 0924   BUN 9 12/04/2023 0924  BUN 12 11/15/2023 0908   CREATININE 0.66 12/04/2023 0924   CREATININE 0.76 02/20/2022 0922   CALCIUM   9.1 12/04/2023 0924   PROT 7.8 12/04/2023 0924   PROT 6.7 11/15/2023 0908   ALBUMIN 3.8 12/04/2023 0924   ALBUMIN 4.1 11/15/2023 0908   AST 18 12/04/2023 0924   ALT 20 12/04/2023 0924   ALKPHOS 111 12/04/2023 0924   BILITOT 1.1 12/04/2023 0924   BILITOT 0.4 11/15/2023 0908   GFRNONAA >60 12/04/2023 0924   GFRAA 94 06/07/2020 0944     Assessment/Plan:       Camie Furbish, PA-C [facility] 02/13/2024, 9:58 PM  Patient Care Team: Theotis Haze ORN, NP as PCP - General (Nurse Practitioner) Tobie Tonita POUR, DO as Consulting Physician (Neurology)

## 2024-02-14 ENCOUNTER — Encounter: Payer: Self-pay | Admitting: Gastroenterology

## 2024-02-14 ENCOUNTER — Ambulatory Visit (INDEPENDENT_AMBULATORY_CARE_PROVIDER_SITE_OTHER): Admitting: Gastroenterology

## 2024-02-14 ENCOUNTER — Other Ambulatory Visit

## 2024-02-14 ENCOUNTER — Ambulatory Visit: Admitting: Gastroenterology

## 2024-02-14 VITALS — BP 120/60 | HR 76 | Ht 62.5 in | Wt 154.0 lb

## 2024-02-14 DIAGNOSIS — K219 Gastro-esophageal reflux disease without esophagitis: Secondary | ICD-10-CM | POA: Diagnosis not present

## 2024-02-14 DIAGNOSIS — R748 Abnormal levels of other serum enzymes: Secondary | ICD-10-CM

## 2024-02-14 DIAGNOSIS — R1310 Dysphagia, unspecified: Secondary | ICD-10-CM

## 2024-02-14 DIAGNOSIS — R1319 Other dysphagia: Secondary | ICD-10-CM

## 2024-02-14 DIAGNOSIS — K5909 Other constipation: Secondary | ICD-10-CM

## 2024-02-14 DIAGNOSIS — Z860101 Personal history of adenomatous and serrated colon polyps: Secondary | ICD-10-CM

## 2024-02-14 LAB — CBC WITH DIFFERENTIAL/PLATELET
Basophils Absolute: 0 K/uL (ref 0.0–0.1)
Basophils Relative: 0.2 % (ref 0.0–3.0)
Eosinophils Absolute: 0 K/uL (ref 0.0–0.7)
Eosinophils Relative: 0.6 % (ref 0.0–5.0)
HCT: 42.7 % (ref 36.0–46.0)
Hemoglobin: 13.9 g/dL (ref 12.0–15.0)
Lymphocytes Relative: 39.7 % (ref 12.0–46.0)
Lymphs Abs: 3.5 K/uL (ref 0.7–4.0)
MCHC: 32.6 g/dL (ref 30.0–36.0)
MCV: 86.2 fl (ref 78.0–100.0)
Monocytes Absolute: 0.4 K/uL (ref 0.1–1.0)
Monocytes Relative: 4.9 % (ref 3.0–12.0)
Neutro Abs: 4.8 K/uL (ref 1.4–7.7)
Neutrophils Relative %: 54.6 % (ref 43.0–77.0)
Platelets: 250 K/uL (ref 150.0–400.0)
RBC: 4.96 Mil/uL (ref 3.87–5.11)
RDW: 13.3 % (ref 11.5–15.5)
WBC: 8.8 K/uL (ref 4.0–10.5)

## 2024-02-14 NOTE — Patient Instructions (Addendum)
 _______________________________________________________  If your blood pressure at your visit was 140/90 or greater, please contact your primary care physician to follow up on this.  _______________________________________________________  If you are age 66 or older, your body mass index should be between 23-30. Your Body mass index is 27.72 kg/m. If this is out of the aforementioned range listed, please consider follow up with your Primary Care Provider.  If you are age 48 or younger, your body mass index should be between 19-25. Your Body mass index is 27.72 kg/m. If this is out of the aformentioned range listed, please consider follow up with your Primary Care Provider.   ________________________________________________________  The [facility] providers would like to encourage you to use MYCHART to communicate with providers for non-urgent requests or questions.  Due to long hold times on the telephone, sending your provider a message by [facility] may be a faster and more efficient way to get a response.  Please allow 48 business hours for a response.  Please remember that this is for non-urgent requests.  _______________________________________________________  [facility] is using a team-based approach to care.  Your team is made up of your doctor and two to three APPS. Our APPS (Nurse Practitioners and Physician Assistants) work with your physician to ensure care continuity for you. They are fully qualified to address your health concerns and develop a treatment plan. They communicate directly with your gastroenterologist to care for you. Seeing the Advanced Practice Practitioners on your physician's team can help you by facilitating care more promptly, often allowing for earlier appointments, access to diagnostic testing, procedures, and other specialty referrals.   Your provider has requested that you go to the basement level for lab work before leaving today. Press B on the  elevator. The lab is located at the first door on the left as you exit the elevator.  Alcohol cessation   Continue current medication  Please follow up in 1 month after your procedure. Give us  a call at [phone] to schedule an appointment.  You have been scheduled for an endoscopy. Please follow written instructions given to you at your visit today.  If you use inhalers (even only as needed), please bring them with you on the day of your procedure.  If you take any of the following medications, they will need to be adjusted prior to your procedure:   DO NOT TAKE 7 DAYS PRIOR TO TEST- Trulicity (dulaglutide) Ozempic, Wegovy (semaglutide) Mounjaro (tirzepatide) Bydureon Bcise (exanatide extended release)  DO NOT TAKE 1 DAY PRIOR TO YOUR TEST Rybelsus (semaglutide) Adlyxin (lixisenatide) Victoza (liraglutide) Byetta (exanatide) ___________________________________________________________________________  It was a pleasure to see you today!  Thank you for trusting me with your gastrointestinal care!

## 2024-02-15 ENCOUNTER — Ambulatory Visit: Payer: Self-pay | Admitting: Gastroenterology

## 2024-02-15 LAB — COMPREHENSIVE METABOLIC PANEL WITH GFR
ALT: 24 U/L (ref 0–35)
AST: 18 U/L (ref 0–37)
Albumin: 4.2 g/dL (ref 3.5–5.2)
Alkaline Phosphatase: 114 U/L (ref 39–117)
BUN: 11 mg/dL (ref 6–23)
CO2: 22 meq/L (ref 19–32)
Calcium: 9.5 mg/dL (ref 8.4–10.5)
Chloride: 106 meq/L (ref 96–112)
Creatinine, Ser: 0.67 mg/dL (ref 0.40–1.20)
GFR: 91.23 mL/min (ref >60.00)
Glucose, Bld: 187 mg/dL — ABNORMAL HIGH (ref 70–99)
Potassium: 4.7 meq/L (ref 3.5–5.1)
Sodium: 143 meq/L (ref 135–145)
Total Bilirubin: 0.5 mg/dL (ref 0.2–1.2)
Total Protein: 7.3 g/dL (ref 6.0–8.3)

## 2024-02-21 ENCOUNTER — Ambulatory Visit: Attending: Nurse Practitioner | Admitting: Nurse Practitioner

## 2024-02-21 ENCOUNTER — Encounter: Payer: Self-pay | Admitting: Nurse Practitioner

## 2024-02-21 VITALS — BP 124/79 | HR 70 | Resp 19 | Ht 62.5 in | Wt 153.4 lb

## 2024-02-21 DIAGNOSIS — E119 Type 2 diabetes mellitus without complications: Secondary | ICD-10-CM | POA: Diagnosis not present

## 2024-02-21 DIAGNOSIS — J302 Other seasonal allergic rhinitis: Secondary | ICD-10-CM | POA: Diagnosis not present

## 2024-02-21 DIAGNOSIS — Z7984 Long term (current) use of oral hypoglycemic drugs: Secondary | ICD-10-CM | POA: Diagnosis not present

## 2024-02-21 DIAGNOSIS — K219 Gastro-esophageal reflux disease without esophagitis: Secondary | ICD-10-CM | POA: Diagnosis not present

## 2024-02-21 DIAGNOSIS — Z1231 Encounter for screening mammogram for malignant neoplasm of breast: Secondary | ICD-10-CM

## 2024-02-21 DIAGNOSIS — E1165 Type 2 diabetes mellitus with hyperglycemia: Secondary | ICD-10-CM

## 2024-02-21 LAB — POCT GLYCOSYLATED HEMOGLOBIN (HGB A1C): Hemoglobin A1C: 8.5 % — AB (ref 4.0–5.6)

## 2024-02-21 MED ORDER — ALBUTEROL SULFATE HFA 108 (90 BASE) MCG/ACT IN AERS: 2.0000 | INHALATION_SPRAY | RESPIRATORY_TRACT | 0 refills | Status: DC | PRN

## 2024-02-21 MED ORDER — GLIMEPIRIDE 4 MG PO TABS: 4.0000 mg | ORAL_TABLET | Freq: Every day | ORAL | 1 refills | Status: DC

## 2024-02-21 MED ORDER — CETIRIZINE HCL 10 MG PO TABS: 10.0000 mg | ORAL_TABLET | Freq: Every day | ORAL | 1 refills | Status: DC

## 2024-02-21 NOTE — Progress Notes (Signed)
 Assessment & Plan:  [name] was seen today for diabetes.  Diagnoses and all orders for this visit:  Diabetes mellitus treated with oral medication (HCC) -     POCT glycosylated hemoglobin (Hb A1C) -     glimepiride  (AMARYL ) 4 MG tablet; Take 1 tablet (4 mg total) by mouth daily with breakfast. A1c increased to 8.5. Blood sugars elevated. Not taking Jardiance  due to itching. Declined Ozempic due to preference against injections. - Increase glimepiride  to 4 mg daily. - Advise to monitor blood sugars and report if she remains high. - Encourage dietary modifications, including sugar-free options and reducing intake of sweets.  Gastroesophageal reflux symptoms Pending endoscopy scheduled with GI   Seasonal allergies -     cetirizine  (ZYRTEC ) 10 MG tablet; Take 1 tablet (10 mg total) by mouth daily. -     albuterol  (PROAIR  HFA) 108 (90 Base) MCG/ACT inhaler; Inhale 2 puffs into the lungs every 4 (four) hours as needed for wheezing or shortness of breath.    Patient has been counseled on age-appropriate routine health concerns for screening and prevention. These are reviewed and up-to-date. Referrals have been placed accordingly. Immunizations are up-to-date or declined.    Subjective:   Chief Complaint  Patient presents with   Diabetes   She has a history of IBS, Hypothyroidism, DM, HTN, OA, Fibromyalgia    History of Present Illness Melissa Harmon is a 68 year old female with diabetes who presents for follow-up of her diabetes management and acid reflux.  She has been experiencing challenges in managing her diabetes, with her HbA1c increasing from 8 to 8.5. Her home blood sugar levels are around 171 mg/dL, even with minimal food intake. She attributes this to consuming sweets like 'nutty buns' and sugar-free ice cream, which she suspects may not be truly sugar-free. She has stopped drinking alcohol but notes an increased craving for sweets. She is currently taking glimepiride  but has  discontinued Jardiance  due to itching and yeast infections. She recently started taking some OTC drops  which she can not recall the name of to help lower her A1c, but she feels they are ineffective. Lab Results  Component Value Date   HGBA1C 8.5 (A) 02/21/2024     She continues to experience issues with acid reflux and is scheduled for an endoscopy on November 11th to investigate further.  Currently taking pantoprazole  40 mg daily and sucralfate  3 times daily with meals and bedtime  Regarding her mammogram, she had it completed with [facility], a facility not affiliated with her current healthcare provider. There is some confusion about the records, and she believes the mammogram was done earlier this year or last year.     Review of Systems  Constitutional:  Negative for fever, malaise/fatigue and weight loss.  HENT: Negative.  Negative for nosebleeds.   Eyes: Negative.  Negative for blurred vision, double vision and photophobia.  Respiratory: Negative.  Negative for cough and shortness of breath.   Cardiovascular: Negative.  Negative for chest pain, palpitations and leg swelling.  Gastrointestinal:  Positive for heartburn. Negative for nausea and vomiting.  Musculoskeletal: Negative.  Negative for myalgias.  Neurological: Negative.  Negative for dizziness, focal weakness, seizures and headaches.  Endo/Heme/Allergies:  Positive for environmental allergies.  Psychiatric/Behavioral: Negative.  Negative for suicidal ideas.     Past Medical History:  Diagnosis Date   Bronchitis    Carpal tunnel syndrome on both sides    Family history of breast cancer  Family history of colonic polyps    Family history of lung cancer    Family history of ovarian cancer    Family history of stomach cancer    Fibromyalgia    GERD (gastroesophageal reflux disease)    History of chronic bronchitis    History of Hashimoto thyroiditis    s/p  bilateral thryoidectomy 03/ 2001    Hyperlipidemia    Hypothyroidism, postsurgical followed by pcp   08-04-1999 s/p  bilateral thyroidectomy for multinodular goitar (per path hashimoto's thyroiditis)   Irritable bowel syndrome with constipation    MDD (major depressive disorder)    Pelvic pain    Right ovarian cyst    Type 2 diabetes mellitus (HCC)    followed by pcp  (08-24-2019 pt stated checks blood sugar dialy in am,  fasting-- 159-175)   Wears contact lenses    Wears dentures    upper    Past Surgical History:  Procedure Laterality Date   CESAREAN SECTION  1986   COLONOSCOPY  last one 05-25-2018   LAPAROSCOPIC UNILATERAL SALPINGO OOPHERECTOMY Bilateral 09/01/2019   Procedure: LAPAROSCOPIC BILATERAL SALPINGO OOPHORECTOMY;  Surgeon: Delana Ted Morrison, DO;  Location: [facility];  Service: Gynecology;  Laterality: Bilateral;   THYROIDECTOMY Bilateral 08-04-1999  dr adel  @[facility]    subtotal   UPPER GASTROINTESTINAL ENDOSCOPY      Family History  Problem Relation Age of Onset   Diabetes Mother    Heart disease Mother    Stroke Mother    Colon cancer Father    Bipolar disorder Father    Colon polyps Father        unknown number   Ovarian cancer Sister 85       or other gynecologic cancer   Schizophrenia Brother    Heart disease Brother    Diverticulitis Maternal Aunt    Stomach cancer Maternal Grandmother        dx ?   Heart attack Maternal Grandmother    Lung cancer Cousin        dx 26s (maternal first cousin)   Breast cancer Cousin        dx 60s/70s (maternal first cousin)   Heart disease Half-Brother    Colon polyps Other 25       >80 polyps (6th degree relative - mother's cousin's grandson)   Esophageal cancer Neg Hx    Rectal cancer Neg Hx     Social History Reviewed with no changes to be made today.   Outpatient Medications Prior to Visit  Medication Sig Dispense Refill   Accu-Chek Softclix Lancets lancets Check blood glucose level by fingerstick twice per day. E11.65 100  each 3   ammonium lactate  (AMLACTIN) 12 % lotion Apply 1 Application topically as needed for dry skin. 400 g 0   atorvastatin  (LIPITOR) 40 MG tablet Take 1 tablet (40 mg total) by mouth daily. 90 tablet 3   Blood Glucose Monitoring Suppl (ACCU-CHEK GUIDE ME) w/Device KIT Check blood glucose level by fingerstick twice per day. E11.65 1 kit 0   celecoxib  (CELEBREX ) 100 MG capsule Take 1 capsule (100 mg total) by mouth 2 (two) times daily. For hip pain (Patient taking differently: Take 100 mg by mouth as needed. For hip pain) 60 capsule 0   docusate sodium  (COLACE) 100 MG capsule Take 1 capsule (100 mg total) by mouth 2 (two) times daily as needed for mild constipation. 10 capsule 1   folic acid  (FOLVITE ) 1 MG tablet Take 1 tablet (1  mg total) by mouth daily. 30 tablet 5   glucose blood (ACCU-CHEK GUIDE TEST) test strip Check blood glucose level by fingerstick twice per day. E11.65 100 each 12   levothyroxine  (SYNTHROID ) 175 MCG tablet Take 1 tablet (175 mcg total) by mouth daily. 30 tablet 2   Lidocaine  5 % CREA Apply topically as prescribed 30 g 1   LINZESS  145 MCG CAPS capsule Take 1 capsule (145 mcg total) by mouth 2 (two) times daily. 180 capsule 1   pantoprazole  (PROTONIX ) 40 MG tablet Take 1 tablet (40 mg total) by mouth daily. 90 tablet 1   sucralfate  (CARAFATE ) 1 g tablet Take 1 tablet (1 g total) by mouth 4 (four) times daily -  with meals and at bedtime. 120 tablet 0   albuterol  (PROAIR  HFA) 108 (90 Base) MCG/ACT inhaler Inhale 2 puffs into the lungs every 4 (four) hours as needed for wheezing or shortness of breath. 18 g 0   glimepiride  (AMARYL ) 2 MG tablet Take 1 tablet (2 mg total) by mouth daily with breakfast. 90 tablet 1   triamcinolone  (KENALOG ) 0.025 % ointment Apply 1 Application topically 2 (two) times daily. (Patient not taking: Reported on 02/21/2024) 60 g 1   cetirizine  (ZYRTEC ) 10 MG tablet Take 1 tablet (10 mg total) by mouth daily. (Patient not taking: Reported on 02/21/2024)  90 tablet 1   empagliflozin  (JARDIANCE ) 25 MG TABS tablet Take 1 tablet (25 mg total) by mouth daily before breakfast. (Patient not taking: Reported on 02/21/2024) 90 tablet 1   Facility-Administered Medications Prior to Visit  Medication Dose Route Frequency Provider Last Rate Last Admin   0.9 %  sodium chloride  infusion  500 mL Intravenous Once Nandigam, Kavitha V, MD        Allergies  Allergen Reactions   Gabapentin Other (See Comments)    Other Reaction(s): memory changes   Metformin And Related Other (See Comments)    GI symptoms; Constipation   Sulfa Antibiotics     Other Reaction(s): hives   Sulfamethoxazole Hives   Other Rash       Objective:    BP 124/79 (BP Location: Left Arm, Patient Position: Sitting, Cuff Size: Normal)   Pulse 70   Resp 19   Ht 5' 2.5 (1.588 m)   Wt 153 lb 6.4 oz (69.6 kg)   SpO2 97%   BMI 27.61 kg/m  Wt Readings from Last 3 Encounters:  02/21/24 153 lb 6.4 oz (69.6 kg)  02/14/24 154 lb (69.9 kg)  12/28/23 150 lb 3.2 oz (68.1 kg)    Physical Exam Vitals and nursing note reviewed.  Constitutional:      Appearance: She is well-developed.  HENT:     Head: Normocephalic and atraumatic.  Cardiovascular:     Rate and Rhythm: Normal rate and regular rhythm.     Heart sounds: Normal heart sounds. No murmur heard.    No friction rub. No gallop.  Pulmonary:     Effort: Pulmonary effort is normal. No tachypnea or respiratory distress.     Breath sounds: Normal breath sounds. No decreased breath sounds, wheezing, rhonchi or rales.  Chest:     Chest wall: No tenderness.  Abdominal:     General: Bowel sounds are normal.     Palpations: Abdomen is soft.  Musculoskeletal:        General: Normal range of motion.     Cervical back: Normal range of motion.  Skin:    General: Skin is warm and dry.  Neurological:     Mental Status: She is alert and oriented to person, place, and time.     Coordination: Coordination normal.  Psychiatric:         Behavior: Behavior normal. Behavior is cooperative.        Thought Content: Thought content normal.        Judgment: Judgment normal.          Patient has been counseled extensively about nutrition and exercise as well as the importance of adherence with medications and regular follow-up. The patient was given clear instructions to go to ER or return to medical center if symptoms don't improve, worsen or new problems develop. The patient verbalized understanding.   Follow-up: Return in about 3 months (around 05/26/2024).   Haze LELON Servant, FNP-BC [facility] [place], [place] [phone]   03/03/2024, 1:33 PM

## 2024-02-25 ENCOUNTER — Telehealth: Payer: Self-pay | Admitting: Nurse Practitioner

## 2024-02-25 NOTE — Telephone Encounter (Signed)
 Copied from CRM #[id number]. Topic: Clinical - Lab/Test Results >> Feb 25, 2024 10:55 AM Nathanel BROCKS wrote: Reason for CRM:   Pt called with her last mammogram was. It was 06/10/2023. She is scheduled 06/15/2024 for next year.

## 2024-02-28 NOTE — Telephone Encounter (Signed)
 Noted

## 2024-03-03 ENCOUNTER — Encounter: Payer: Self-pay | Admitting: Nurse Practitioner

## 2024-03-20 ENCOUNTER — Encounter: Payer: Self-pay | Admitting: Gastroenterology

## 2024-03-20 ENCOUNTER — Ambulatory Visit: Admitting: Gastroenterology

## 2024-03-20 VITALS — BP 126/73 | HR 70 | Temp 97.3°F | Resp 13 | Ht 62.5 in | Wt 154.0 lb

## 2024-03-20 DIAGNOSIS — B3781 Candidal esophagitis: Secondary | ICD-10-CM

## 2024-03-20 DIAGNOSIS — R1319 Other dysphagia: Secondary | ICD-10-CM

## 2024-03-20 MED ORDER — SODIUM CHLORIDE 0.9 % IV SOLN: 500.0000 mL | Freq: Once | INTRAVENOUS | Status: AC

## 2024-03-20 MED ORDER — FLUCONAZOLE 100 MG PO TABS: 100.0000 mg | ORAL_TABLET | Freq: Every day | ORAL | 0 refills | Status: AC

## 2024-03-20 MED ORDER — PANTOPRAZOLE SODIUM 40 MG PO TBEC: 40.0000 mg | DELAYED_RELEASE_TABLET | Freq: Every day | ORAL | 3 refills | Status: AC

## 2024-03-20 NOTE — Progress Notes (Unsigned)
 To pacu, VSS. Report to [email or web address]

## 2024-03-20 NOTE — Progress Notes (Unsigned)
 Called to room to assist during endoscopic procedure.  Patient ID and intended procedure confirmed with present staff. Received instructions for my participation in the procedure from the performing physician.

## 2024-03-20 NOTE — Progress Notes (Unsigned)
 [facility] History and Physical   Primary Care Physician:  Theotis Haze ORN, NP   Reason for Procedure:  Dysphagia  Plan:    EGD with possible interventions as needed     HPI: Melissa Harmon is a very pleasant 68 y.o. female here for EGD for dysphagia.   The risks and benefits as well as alternatives of endoscopic procedure(s) have been discussed and reviewed.  The patient was provided an opportunity to ask questions and all were answered. The patient agreed with the plan and demonstrated an understanding of the instructions.   Past Medical History:  Diagnosis Date   Bronchitis    Carpal tunnel syndrome on both sides    Family history of breast cancer    Family history of colonic polyps    Family history of lung cancer    Family history of ovarian cancer    Family history of stomach cancer    Fibromyalgia    GERD (gastroesophageal reflux disease)    History of chronic bronchitis    History of Hashimoto thyroiditis    s/p  bilateral thryoidectomy 03/ 2001   Hyperlipidemia    Hypothyroidism, postsurgical followed by pcp   08-04-1999 s/p  bilateral thyroidectomy for multinodular goitar (per path hashimoto's thyroiditis)   Irritable bowel syndrome with constipation    MDD (major depressive disorder)    Pelvic pain    Right ovarian cyst    Type 2 diabetes mellitus (HCC)    followed by pcp  (08-24-2019 pt stated checks blood sugar dialy in am,  fasting-- 159-175)   Wears contact lenses    Wears dentures    upper    Past Surgical History:  Procedure Laterality Date   CESAREAN SECTION  1986   COLONOSCOPY  last one 05-25-2018   LAPAROSCOPIC UNILATERAL SALPINGO OOPHERECTOMY Bilateral 09/01/2019   Procedure: LAPAROSCOPIC BILATERAL SALPINGO OOPHORECTOMY;  Surgeon: Delana Ted Morrison, DO;  Location: [facility];  Service: Gynecology;  Laterality: Bilateral;   THYROIDECTOMY Bilateral 08-04-1999  dr adel  @[facility]    subtotal   UPPER GASTROINTESTINAL  ENDOSCOPY      Prior to Admission medications   Medication Sig Start Date End Date Taking? Authorizing Provider  Accu-Chek Softclix Lancets lancets Check blood glucose level by fingerstick twice per day. E11.65 11/19/23  Yes Fleming, [name] W, NP  albuterol  (PROAIR  HFA) 108 (90 Base) MCG/ACT inhaler Inhale 2 puffs into the lungs every 4 (four) hours as needed for wheezing or shortness of breath. 02/21/24  Yes Fleming, [name] W, NP  ammonium lactate  (AMLACTIN) 12 % lotion Apply 1 Application topically as needed for dry skin. 09/13/23  Yes Gershon Donnice SAUNDERS, DPM  atorvastatin  (LIPITOR) 40 MG tablet Take 1 tablet (40 mg total) by mouth daily. 12/29/23  Yes Fleming, [name] W, NP  Blood Glucose Monitoring Suppl (ACCU-CHEK GUIDE ME) w/Device KIT Check blood glucose level by fingerstick twice per day. E11.65 11/19/23  Yes Theotis Haze ORN, NP  docusate sodium  (COLACE) 100 MG capsule Take 1 capsule (100 mg total) by mouth 2 (two) times daily as needed for mild constipation. 11/19/22  Yes Theotis Haze ORN, NP  glucose blood (ACCU-CHEK GUIDE TEST) test strip Check blood glucose level by fingerstick twice per day. E11.65 11/19/23  Yes Fleming, [name] W, NP  levothyroxine  (SYNTHROID ) 175 MCG tablet Take 1 tablet (175 mcg total) by mouth daily. 09/17/23  Yes Newlin, Enobong, MD  Lidocaine  5 % CREA Apply topically as prescribed 01/12/23  Yes Fleming, [name] W, NP  LINZESS  145 MCG CAPS capsule Take 1 capsule (145 mcg total) by mouth 2 (two) times daily. 12/15/23  Yes Fleming, [name] W, NP  pantoprazole  (PROTONIX ) 40 MG tablet Take 1 tablet (40 mg total) by mouth daily. 11/09/23  Yes Theotis Haze ORN, NP  sucralfate  (CARAFATE ) 1 g tablet Take 1 tablet (1 g total) by mouth 4 (four) times daily -  with meals and at bedtime. 12/04/23  Yes Simon Lavonia SAILOR, MD  celecoxib  (CELEBREX ) 100 MG capsule Take 1 capsule (100 mg total) by mouth 2 (two) times daily. For hip pain Patient not taking: Reported on 03/20/2024 09/13/23   Fleming, [name] W, NP   cetirizine  (ZYRTEC ) 10 MG tablet Take 1 tablet (10 mg total) by mouth daily. 02/21/24   Fleming, [name] W, NP  folic acid  (FOLVITE ) 1 MG tablet Take 1 tablet (1 mg total) by mouth daily. 08/17/23   Patel, Donika K, DO  glimepiride  (AMARYL ) 4 MG tablet Take 1 tablet (4 mg total) by mouth daily with breakfast. Patient not taking: Reported on 03/20/2024 02/21/24   Fleming, [name] W, NP  triamcinolone  (KENALOG ) 0.025 % ointment Apply 1 Application topically 2 (two) times daily. Patient not taking: No sig reported 12/29/23   Theotis Haze ORN, NP  omeprazole  (PRILOSEC) 20 MG capsule Take 1 capsule (20 mg total) by mouth daily. 12/26/14 06/09/22  Mark Rush, MD    Current Outpatient Medications  Medication Sig Dispense Refill   Accu-Chek Softclix Lancets lancets Check blood glucose level by fingerstick twice per day. E11.65 100 each 3   albuterol  (PROAIR  HFA) 108 (90 Base) MCG/ACT inhaler Inhale 2 puffs into the lungs every 4 (four) hours as needed for wheezing or shortness of breath. 18 g 0   ammonium lactate  (AMLACTIN) 12 % lotion Apply 1 Application topically as needed for dry skin. 400 g 0   atorvastatin  (LIPITOR) 40 MG tablet Take 1 tablet (40 mg total) by mouth daily. 90 tablet 3   Blood Glucose Monitoring Suppl (ACCU-CHEK GUIDE ME) w/Device KIT Check blood glucose level by fingerstick twice per day. E11.65 1 kit 0   docusate sodium  (COLACE) 100 MG capsule Take 1 capsule (100 mg total) by mouth 2 (two) times daily as needed for mild constipation. 10 capsule 1   glucose blood (ACCU-CHEK GUIDE TEST) test strip Check blood glucose level by fingerstick twice per day. E11.65 100 each 12   levothyroxine  (SYNTHROID ) 175 MCG tablet Take 1 tablet (175 mcg total) by mouth daily. 30 tablet 2   Lidocaine  5 % CREA Apply topically as prescribed 30 g 1   LINZESS  145 MCG CAPS capsule Take 1 capsule (145 mcg total) by mouth 2 (two) times daily. 180 capsule 1   pantoprazole  (PROTONIX ) 40 MG tablet Take 1 tablet (40  mg total) by mouth daily. 90 tablet 1   sucralfate  (CARAFATE ) 1 g tablet Take 1 tablet (1 g total) by mouth 4 (four) times daily -  with meals and at bedtime. 120 tablet 0   celecoxib  (CELEBREX ) 100 MG capsule Take 1 capsule (100 mg total) by mouth 2 (two) times daily. For hip pain (Patient not taking: Reported on 03/20/2024) 60 capsule 0   cetirizine  (ZYRTEC ) 10 MG tablet Take 1 tablet (10 mg total) by mouth daily. 90 tablet 1   folic acid  (FOLVITE ) 1 MG tablet Take 1 tablet (1 mg total) by mouth daily. 30 tablet 5   glimepiride  (AMARYL ) 4 MG tablet Take 1 tablet (4 mg total) by mouth daily with  breakfast. (Patient not taking: Reported on 03/20/2024) 90 tablet 1   triamcinolone  (KENALOG ) 0.025 % ointment Apply 1 Application topically 2 (two) times daily. (Patient not taking: No sig reported) 60 g 1   Current Facility-Administered Medications  Medication Dose Route Frequency Provider Last Rate Last Admin   0.9 %  sodium chloride  infusion  500 mL Intravenous Once Loletha Bertini V, MD       0.9 %  sodium chloride  infusion  500 mL Intravenous Once Georgann Bramble V, MD        Allergies as of 03/20/2024 - Review Complete 03/20/2024  Allergen Reaction Noted   Gabapentin Other (See Comments) 03/11/2020   Metformin and related Other (See Comments) 07/16/2020   Sulfa antibiotics Hives 02/11/2023   Sulfamethoxazole Hives 12/10/2005   Other Rash 03/11/2020    Family History  Problem Relation Age of Onset   Diabetes Mother    Heart disease Mother    Stroke Mother    Colon cancer Father    Bipolar disorder Father    Colon polyps Father        unknown number   Ovarian cancer Sister 63       or other gynecologic cancer   Schizophrenia Brother    Heart disease Brother    Diverticulitis Maternal Aunt    Stomach cancer Maternal Grandmother        dx ?   Heart attack Maternal Grandmother    Lung cancer Cousin        dx 73s (maternal first cousin)   Breast cancer Cousin        dx 60s/70s  (maternal first cousin)   Heart disease Half-Brother    Colon polyps Other 25       >80 polyps (6th degree relative - mother's cousin's grandson)   Esophageal cancer Neg Hx    Rectal cancer Neg Hx     Social History   Socioeconomic History   Marital status: Single    Spouse name: Not on file   Number of children: 3   Years of education: Not on file   Highest education level: Not on file  Occupational History   Not on file  Tobacco Use   Smoking status: Every Day    Current packs/day: 0.25    Average packs/day: 0.3 packs/day for 25.0 years (6.3 ttl pk-yrs)    Types: Cigarettes    Passive exposure: Current   Smokeless tobacco: Never   Tobacco comments:    6 cig per day  Vaping Use   Vaping status: Never Used  Substance and Sexual Activity   Alcohol use: Yes    Alcohol/week: 6.0 standard drinks of alcohol    Types: 6 Cans of beer per week    Comment: ocasional glass of wine   Drug use: Never   Sexual activity: Not Currently    Birth control/protection: Post-menopausal  Other Topics Concern   Not on file  Social History Narrative   Social Hx:   Current living situation- lives in [place] alone   Born in [place] and raised in [place] by mom and dad   Siblings 3 living siblings but total 5 (pt is number the youngest)   Schooling- HS grad   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 Right Handed    Are  you currently employed ? No    What is your current occupation? Retired    Do you live at home alone? Yes   Who lives with you?    What type of home do you live in: 1 story or 2 story? Lives in one story home        Social Drivers of Health   Financial Resource Strain: Medium Risk (08/13/2023)   Overall Financial Resource Strain (CARDIA)    Difficulty of Paying Living Expenses: Somewhat hard  Food Insecurity: No  Food Insecurity (08/13/2023)   Hunger Vital Sign    Worried About Running Out of Food in the Last Year: Never true    Ran Out of Food in the Last Year: Never true  Transportation Needs: No Transportation Needs (08/13/2023)   PRAPARE - Administrator, Civil Service (Medical): No    Lack of Transportation (Non-Medical): No  Physical Activity: Insufficiently Active (08/13/2023)   Exercise Vital Sign    Days of Exercise per Week: 4 days    Minutes of Exercise per Session: 30 min  Stress: Stress Concern Present (08/13/2023)   [employer] of Occupational Health - Occupational Stress Questionnaire    Feeling of Stress : To some extent  Social Connections: Moderately Isolated (08/13/2023)   Social Connection and Isolation Panel    Frequency of Communication with Friends and Family: More than three times a week    Frequency of Social Gatherings with Friends and Family: More than three times a week    Attends Religious Services: Never    Database Administrator or Organizations: Yes    Attends Banker Meetings: Never    Marital Status: Divorced  Catering Manager Violence: Not At Risk (08/13/2023)   Humiliation, Afraid, Rape, and Kick questionnaire    Fear of Current or Ex-Partner: No    Emotionally Abused: No    Physically Abused: No    Sexually Abused: No    Review of Systems:  All other review of systems negative except as mentioned in the HPI.  Physical Exam: Vital signs in last 24 hours: BP 135/72   Pulse 65   Temp (!) 97.3 F (36.3 C) (Temporal)   Resp 18   Ht 5' 2.5 (1.588 m)   Wt 154 lb (69.9 kg)   SpO2 100%   BMI 27.72 kg/m  General:   Alert, NAD Lungs:  Clear .   Heart:  Regular rate and rhythm Abdomen:  Soft, nontender and nondistended. Neuro/Psych:  Alert and cooperative. Normal mood and affect. A and O x 3  Reviewed labs, radiology imaging, old records and pertinent past GI work up  Patient is appropriate for planned procedure(s) and  anesthesia in an ambulatory setting   K. Veena Findley Vi , MD [phone]

## 2024-03-20 NOTE — Op Note (Signed)
  [facility] Patient Name: Melissa Harmon Procedure Date: 03/20/2024 1:22 PM MRN: [id number] Endoscopist: Gustav ALONSO Mcgee , MD, [id number] Age: 68 Referring MD:  Date of Birth: 11-14-1955 Gender: Female Account #: [id number] Procedure:                Upper GI endoscopy Indications:              Dysphagia Medicines:                Monitored Anesthesia Care Procedure:                Pre-Anesthesia Assessment:                           - Prior to the procedure, a History and Physical                            was performed, and patient medications and                            allergies were reviewed. The patient's tolerance of                            previous anesthesia was also reviewed. The risks                            and benefits of the procedure and the sedation                            options and risks were discussed with the patient.                            All questions were answered, and informed consent                            was obtained. Prior Anticoagulants: The patient has                            taken no anticoagulant or antiplatelet agents. ASA                            Grade Assessment: II - A patient with mild systemic                            disease. After reviewing the risks and benefits,                            the patient was deemed in satisfactory condition to                            undergo the procedure.                           After obtaining informed consent, the endoscope was  passed under direct vision. Throughout the                            procedure, the patient's blood pressure, pulse, and                            oxygen saturations were monitored continuously. The                            Olympus Scope [id number] was introduced through the                            mouth, and advanced to the second part of duodenum.                            The upper GI endoscopy was  accomplished without                            difficulty. The patient tolerated the procedure                            well. Scope In: Scope Out: Findings:                 LA Grade B (one or more mucosal breaks greater than                            5 mm, not extending between the tops of two mucosal                            folds) esophagitis with no bleeding was found 30 to                            38 cm from the incisors. Biopsies were taken with a                            cold forceps for histology.                           The stomach was normal.                           The cardia and gastric fundus were normal on                            retroflexion.                           The examined duodenum was normal. Complications:            No immediate complications. Impression:               - LA Grade B candidiasis esophagitis with no                            bleeding.  Biopsied.                           - Normal stomach.                           - Normal examined duodenum. Recommendation:           - Resume previous diet.                           - Continue present medications.                           - Await pathology results.                           - Follow an antireflux regimen.                           - Diflucan  (fluconazole ) 100 mg PO daily for 3                            weeks.                           - Use Protonix  (pantoprazole ) 40 mg PO daily. Deon Ivey V. Addilee Neu, MD 03/20/2024 2:05:09 PM This report has been signed electronically.

## 2024-03-20 NOTE — Patient Instructions (Addendum)
 YOU HAD AN ENDOSCOPIC PROCEDURE TODAY AT THE [facility]:   Refer to the procedure report that was given to you for any specific questions about what was found during the examination.  If the procedure report does not answer your questions, please call your gastroenterologist to clarify.  If you requested that your care partner not be given the details of your procedure findings, then the procedure report has been included in a sealed envelope for you to review at your convenience later.  YOU SHOULD EXPECT: Some feelings of bloating in the abdomen. Passage of more gas than usual.  Walking can help get rid of the air that was put into your GI tract during the procedure and reduce the bloating. If you had a lower endoscopy (such as a colonoscopy or flexible sigmoidoscopy) you may notice spotting of blood in your stool or on the toilet paper. If you underwent a bowel prep for your procedure, you may not have a normal bowel movement for a few days.  Please Note:  You might notice some irritation and congestion in your nose or some drainage.  This is from the oxygen used during your procedure.  There is no need for concern and it should clear up in a day or so.  SYMPTOMS TO REPORT IMMEDIATELY:  Following upper endoscopy (EGD)  Vomiting of blood or coffee ground material  New chest pain or pain under the shoulder blades  Painful or persistently difficult swallowing  New shortness of breath  Fever of 100F or higher  Black, tarry-looking stools  For urgent or emergent issues, a gastroenterologist can be reached at any hour by calling [phone]. Do not use MyChart messaging for urgent concerns.    DIET:  We do recommend a small meal at first, but then you may proceed to your regular diet.  Drink plenty of fluids but you should avoid alcoholic beverages for 24 hours.  MEDICATIONS: Continue present medications. Use Diflucan  (fluconazole ) 100 mg by mouth daily for 3 weeks. Use Protonix   (pantoprazole ) 40 mg by mouth daily.  FOLLOW UP: Await pathology results. Follow an Anti-reflux regimen (see handout). Follow up in the GI office in 4-6 weeks. Appointment made for Monday, December 29 at 0830 with Camie Furbish, PA. Repeat EGD in 2-3 months. Our January schedule is not available for the endoscopy center yet so Dr. Trenna office nurse will call you to make this appointment.  Educational handouts given to patient: Esophagitis, Anti-reflux regimen.  Thank you for allowing us  to provide for your healthcare needs today.  ACTIVITY:  You should plan to take it easy for the rest of today and you should NOT DRIVE or use heavy machinery until tomorrow (because of the sedation medicines used during the test).    FOLLOW UP: Our staff will call the number listed on your records the next business day following your procedure.  We will call around 7:15- 8:00 am to check on you and address any questions or concerns that you may have regarding the information given to you following your procedure. If we do not reach you, we will leave a message.     If any biopsies were taken you will be contacted by phone or by letter within the next 1-3 weeks.  Please call us  at [phone] if you have not heard about the biopsies in 3 weeks.    SIGNATURES/CONFIDENTIALITY: You and/or your care partner have signed paperwork which will be entered into your electronic medical record.  These  signatures attest to the fact that that the information above on your After Visit Summary has been reviewed and is understood.  Full responsibility of the confidentiality of this discharge information lies with you and/or your care-partner.

## 2024-03-20 NOTE — Progress Notes (Unsigned)
 Pt's states no medical or surgical changes since previsit or office visit.

## 2024-03-21 ENCOUNTER — Telehealth: Payer: Self-pay

## 2024-03-21 NOTE — Telephone Encounter (Signed)
 Unable to reach upon follow up call. Unable to leave voice message.

## 2024-03-23 ENCOUNTER — Ambulatory Visit: Payer: Self-pay | Admitting: Gastroenterology

## 2024-03-23 LAB — SURGICAL PATHOLOGY

## 2024-05-08 ENCOUNTER — Encounter: Payer: Self-pay | Admitting: Gastroenterology

## 2024-05-08 ENCOUNTER — Ambulatory Visit (INDEPENDENT_AMBULATORY_CARE_PROVIDER_SITE_OTHER): Admitting: Gastroenterology

## 2024-05-08 ENCOUNTER — Other Ambulatory Visit: Payer: Self-pay

## 2024-05-08 VITALS — BP 122/70 | HR 79 | Ht 62.5 in | Wt 159.0 lb

## 2024-05-08 DIAGNOSIS — R1310 Dysphagia, unspecified: Secondary | ICD-10-CM | POA: Diagnosis not present

## 2024-05-08 DIAGNOSIS — B3781 Candidal esophagitis: Secondary | ICD-10-CM | POA: Diagnosis not present

## 2024-05-08 DIAGNOSIS — K5909 Other constipation: Secondary | ICD-10-CM

## 2024-05-08 DIAGNOSIS — R1319 Other dysphagia: Secondary | ICD-10-CM

## 2024-05-08 DIAGNOSIS — K219 Gastro-esophageal reflux disease without esophagitis: Secondary | ICD-10-CM

## 2024-05-08 MED ORDER — LINACLOTIDE 290 MCG PO CAPS: 290.0000 ug | ORAL_CAPSULE | Freq: Every day | ORAL | 0 refills | Status: AC

## 2024-05-08 NOTE — Progress Notes (Signed)
 "  Melissa Harmon [id number] 05-04-1956   Chief Complaint: Repeat EGD  Referring Provider: Theotis Haze ORN, NP Primary GI MD: Dr. Shila  HPI: Melissa Harmon is a 68 y.o. female with past medical history of GERD, fibromyalgia, Hashimoto thyroiditis, s/p thyroidectomy, hypothyroidism, HLD, depression, IBS, T2DM, family history of colon polyps, family history of stomach cancer who presents today to discuss repeat EGD.    Seen in office 10/07/2018 for evaluation of elevated liver enzymes.  At that time thought likely secondary to steatohepatitis and alcohol abuse.  Workup suggestive of possible autoimmune hepatitis.  Patient advised to abstain from alcohol and start prednisone  taper.  On follow-up labs LFTs returned to normal, advised to continue to abstain from alcohol.   Seen in the ED 12/04/2023 for sore throat and painful swallowing, regurgitation, mid chest pain.  Had been eating pork chops the day before when symptoms began.  Stated she was taking a PPI 20 mg daily.  Noted to smoke cigarettes on a daily basis.  Unremarkable exam of the mouth in the ED.  ED provider spoke with Dr. Sybil who advised IV octreotide.  Patient was able to eat and drink and endorsed some soreness but otherwise felt fine.  Advised Carafate  3-4 times daily and follow-up with GI, PPI 40 mg daily.  Labs notable for small leukocytosis, likely reactive.  No concern for esophageal perforation.  At last visit 02/14/2024 patient endorsed continued intermittent dysphagia following ED visit.  No prior EGD.  Endorsed history of chronic GERD.  Significant smoking history/current smoker.  She was scheduled for EGD, advised to continue PPI daily, advised smoking cessation. For chronic constipation was advised to continue Linzess  145 mcg daily and stool softeners as needed.  Due for repeat colonoscopy 06/2025. Liver enzymes repeated with plan for RUQ ultrasound if they remain elevated with further serologic evaluation as well.   Advised alcohol cessation.  Labs are stable with normal liver enzymes.  She underwent EGD 03/20/2024 with finding of LA grade B candidiasis esophagitis.  Was started on fluconazole  100 mg p.o. daily for 3 weeks, continue Protonix .  Advised to have repeat EGD in 2 to 3 months to document healing and for further esophageal dilation.   Discussed the use of AI scribe software for clinical note transcription with the patient, who gave verbal consent to proceed.  History of Present Illness Melissa Harmon is a 68 year old female with esophageal candidiasis and chronic constipation who presents for follow-up after recent endoscopy and treatment.  Dysphagia and esophageal candidiasis - Upper endoscopy in November revealed esophageal candidiasis. - Completed a three-week course of fluconazole  with significant improvement in dysphagia. - Swallowing is described as a whole lot better and fluconazole  was helping me a whole lot. - No stricture or narrowing was seen on endoscopy. - Continues daily Protonix . - No current significant heartburn or reflux symptoms. - Occasional heartburn occurs after spicy foods, which she avoids.  Chronic constipation and irritable bowel syndrome - Constipation can last two to three days at a time. - Increased flatulence present. - Linzess  145 mcg taken, sometimes twice daily, along with stool softeners as needed. - Current regimen is effective most of the time, but occasionally requires two doses of Linzess  in a day.    Previous GI Procedures/Imaging   EGD 03/20/2024 - LA Grade B candidiasis esophagitis with no bleeding. Biopsied.  - Normal stomach.  - Normal examined duodenum. Path: 1. Surgical [P], esophagus :       ACUTE  CANDIDA ESOPHAGITIS.       FUNDAL YEAST FORM AND PSEUDOHYPHAE IDENTIFIED MORPHOLOGICALLY CONSISTENT WITH       CANDIDA SPECIES.       NEGATIVE FOR INTESTINAL METAPLASIA OR DYSPLASIA.   Colonoscopy 06/15/2022 - One 1 mm polyp in the  cecum, removed with a cold biopsy forceps. Resected and retrieved.  - Three 4 to 6 mm polyps in the rectum, in the sigmoid colon and in the ascending colon, removed with a cold snare. Resected and retrieved.  - Diverticulosis in the sigmoid colon and in the descending colon.  - Non-bleeding internal hemorrhoids. - Recall 3 years Path: 1. Surgical [P], colon, ascending, cecum, polyp (2) - TUBULAR ADENOMA(S) - NEGATIVE FOR HIGH-GRADE DYSPLASIA OR MALIGNANCY 2. Surgical [P], colon, sigmoid, rectum, polyp (2) - TUBULAR ADENOMA(S) - NEGATIVE FOR HIGH-GRADE DYSPLASIA OR MALIGNANCY   Past Medical History:  Diagnosis Date   Bronchitis    Carpal tunnel syndrome on both sides    Family history of breast cancer    Family history of colonic polyps    Family history of lung cancer    Family history of ovarian cancer    Family history of stomach cancer    Fibromyalgia    GERD (gastroesophageal reflux disease)    History of chronic bronchitis    History of Hashimoto thyroiditis    s/p  bilateral thryoidectomy 03/ 2001   Hyperlipidemia    Hypothyroidism, postsurgical followed by pcp   08-04-1999 s/p  bilateral thyroidectomy for multinodular goitar (per path hashimoto's thyroiditis)   Irritable bowel syndrome with constipation    MDD (major depressive disorder)    Pelvic pain    Right ovarian cyst    Type 2 diabetes mellitus (HCC)    followed by pcp  (08-24-2019 pt stated checks blood sugar dialy in am,  fasting-- 159-175)   Wears contact lenses    Wears dentures    upper    Past Surgical History:  Procedure Laterality Date   CESAREAN SECTION  1986   COLONOSCOPY  last one 05-25-2018   LAPAROSCOPIC UNILATERAL SALPINGO OOPHERECTOMY Bilateral 09/01/2019   Procedure: LAPAROSCOPIC BILATERAL SALPINGO OOPHORECTOMY;  Surgeon: Delana Ted Morrison, DO;  Location: [facility];  Service: Gynecology;  Laterality: Bilateral;   THYROIDECTOMY Bilateral 08-04-1999  dr adel  @[facility]     subtotal   UPPER GASTROINTESTINAL ENDOSCOPY      Current Outpatient Medications  Medication Sig Dispense Refill   Accu-Chek Softclix Lancets lancets Check blood glucose level by fingerstick twice per day. E11.65 100 each 3   albuterol  (PROAIR  HFA) 108 (90 Base) MCG/ACT inhaler Inhale 2 puffs into the lungs every 4 (four) hours as needed for wheezing or shortness of breath. 18 g 0   ammonium lactate  (AMLACTIN) 12 % lotion Apply 1 Application topically as needed for dry skin. 400 g 0   atorvastatin  (LIPITOR) 40 MG tablet Take 1 tablet (40 mg total) by mouth daily. 90 tablet 3   Blood Glucose Monitoring Suppl (ACCU-CHEK GUIDE ME) w/Device KIT Check blood glucose level by fingerstick twice per day. E11.65 1 kit 0   celecoxib  (CELEBREX ) 100 MG capsule Take 1 capsule (100 mg total) by mouth 2 (two) times daily. For hip pain 60 capsule 0   cetirizine  (ZYRTEC ) 10 MG tablet Take 1 tablet (10 mg total) by mouth daily. 90 tablet 1   docusate sodium  (COLACE) 100 MG capsule Take 1 capsule (100 mg total) by mouth 2 (two) times daily as needed for mild constipation.  10 capsule 1   fluconazole  (DIFLUCAN ) 100 MG tablet Take 1 tablet (100 mg total) by mouth daily. 21 tablet 0   folic acid  (FOLVITE ) 1 MG tablet Take 1 tablet (1 mg total) by mouth daily. 30 tablet 5   glimepiride  (AMARYL ) 4 MG tablet Take 1 tablet (4 mg total) by mouth daily with breakfast. 90 tablet 1   glucose blood (ACCU-CHEK GUIDE TEST) test strip Check blood glucose level by fingerstick twice per day. E11.65 100 each 12   levothyroxine  (SYNTHROID ) 175 MCG tablet Take 1 tablet (175 mcg total) by mouth daily. 30 tablet 2   Lidocaine  5 % CREA Apply topically as prescribed 30 g 1   LINZESS  145 MCG CAPS capsule Take 1 capsule (145 mcg total) by mouth 2 (two) times daily. 180 capsule 1   pantoprazole  (PROTONIX ) 40 MG tablet Take 1 tablet (40 mg total) by mouth daily. 90 tablet 1   sucralfate  (CARAFATE ) 1 g tablet Take 1 tablet (1 g total) by mouth  4 (four) times daily -  with meals and at bedtime. 120 tablet 0   triamcinolone  (KENALOG ) 0.025 % ointment Apply 1 Application topically 2 (two) times daily. 60 g 1   pantoprazole  (PROTONIX ) 40 MG tablet Take 1 tablet (40 mg total) by mouth daily. (Patient not taking: Reported on 05/08/2024) 90 tablet 3   Current Facility-Administered Medications  Medication Dose Route Frequency Provider Last Rate Last Admin   0.9 %  sodium chloride  infusion  500 mL Intravenous Once Nandigam, Kavitha V, MD       0.9 %  sodium chloride  infusion  500 mL Intravenous Once Nandigam, Kavitha V, MD        Allergies as of 05/08/2024 - Review Complete 05/08/2024  Allergen Reaction Noted   Gabapentin Other (See Comments) 03/11/2020   Metformin and related Other (See Comments) 07/16/2020   Sulfa antibiotics Hives 02/11/2023   Sulfamethoxazole Hives 12/10/2005   Other Rash 03/11/2020    Family History  Problem Relation Age of Onset   Diabetes Mother    Heart disease Mother    Stroke Mother    Colon cancer Father    Bipolar disorder Father    Colon polyps Father        unknown number   Ovarian cancer Sister 74       or other gynecologic cancer   Schizophrenia Brother    Heart disease Brother    Diverticulitis Maternal Aunt    Stomach cancer Maternal Grandmother        dx ?   Heart attack Maternal Grandmother    Lung cancer Cousin        dx 48s (maternal first cousin)   Breast cancer Cousin        dx 60s/70s (maternal first cousin)   Heart disease Half-Brother    Colon polyps Other 25       >80 polyps (6th degree relative - mother's cousin's grandson)   Esophageal cancer Neg Hx    Rectal cancer Neg Hx     Social History[1]   Review of Systems:    Constitutional: No weight loss, fever, chills Cardiovascular: No chest pain Respiratory: No SOB  Gastrointestinal: See HPI and otherwise negative   Physical Exam:  Vital signs: BP 122/70   Pulse 79   Ht 5' 2.5 (1.588 m)   Wt 159 lb (72.1 kg)    BMI 28.62 kg/m   Wt Readings from Last 3 Encounters:  05/08/24 159 lb (72.1 kg)  03/20/24  154 lb (69.9 kg)  02/21/24 153 lb 6.4 oz (69.6 kg)     Constitutional: Pleasant, overweight female in NAD, alert and cooperative Head:  Normocephalic and atraumatic.  Respiratory: Respirations even and unlabored. Lungs clear to auscultation bilaterally.  No wheezes, crackles, or rhonchi.  Cardiovascular:  Regular rate and rhythm. No murmurs. No peripheral edema. Gastrointestinal:  Soft, nondistended, nontender. No rebound or guarding. Normal bowel sounds. No appreciable masses or hepatomegaly. Rectal:  Not performed.  Neurologic:  Alert and oriented x4;  grossly normal neurologically.  Skin:   Dry and intact without significant lesions or rashes. Psychiatric: Oriented to person, place and time. Demonstrates good judgement and reason without abnormal affect or behaviors.   Assessment/Plan:   Assessment & Plan Esophageal candidiasis with dysphagia Post-treatment with improved dysphagia. Repeat endoscopy needed for mucosal healing assessment and potential dilation.  - Scheduled repeat upper endoscopy in February to assess healing and consider esophageal dilation if indicated. I thoroughly discussed the procedure with the patient to include nature of the procedure, alternatives, benefits, and risks (including but not limited to bleeding, infection, perforation, anesthesia/cardiac/pulmonary complications). Patient verbalized understanding and gave verbal consent to proceed with procedure.  - Confirmed completion of fluconazole  course. - Continued pantoprazole  once daily. - Discussed possible future fluconazole  treatment if symptoms recur.  Chronic constipation Chronic constipation likely due to IBS with incomplete response to current Linzess  dose. Interested in higher dose trial for symptom control.  - Provided samples of Linzess  290 mcg for trial. - Advised to notify office if higher dose  preferred for new prescription.    Camie Furbish, PA-C [facility] 05/08/2024, 8:40 AM  Patient Care Team: Theotis Haze ORN, NP as PCP - General (Nurse Practitioner) Tobie Tonita POUR, DO as Consulting Physician (Neurology)       [1]  Social History Tobacco Use   Smoking status: Every Day    Current packs/day: 0.25    Average packs/day: 0.3 packs/day for 25.0 years (6.3 ttl pk-yrs)    Types: Cigarettes    Passive exposure: Current   Smokeless tobacco: Never   Tobacco comments:    6 cig per day  Vaping Use   Vaping status: Never Used  Substance Use Topics   Alcohol use: Yes    Alcohol/week: 6.0 standard drinks of alcohol    Types: 6 Cans of beer per week    Comment: ocasional glass of wine   Drug use: Never   "

## 2024-05-08 NOTE — Patient Instructions (Addendum)
 You have been scheduled for an endoscopy. Please follow written instructions given to you at your visit today.  If you use inhalers (even only as needed), please bring them with you on the day of your procedure.  If you take any of the following medications, they will need to be adjusted prior to your procedure:   DO NOT TAKE 7 DAYS PRIOR TO TEST- Trulicity (dulaglutide) Ozempic, Wegovy (semaglutide) Mounjaro, Zepbound (tirzepatide) Bydureon Bcise (exanatide extended release)  DO NOT TAKE 1 DAY PRIOR TO YOUR TEST Rybelsus (semaglutide) Adlyxin (lixisenatide) Victoza (liraglutide) Byetta (exanatide) ___________________________________________________________________________ We are providing you with samples of Linzess  290 mcg today to try. Let us  know if it works better than the . _______________________________________________________  If your blood pressure at your visit was 140/90 or greater, please contact your primary care physician to follow up on this.  _______________________________________________________  If you are age 66 or older, your body mass index should be between 23-30. Your Body mass index is 28.62 kg/m. If this is out of the aforementioned range listed, please consider follow up with your Primary Care Provider.  If you are age 66 or younger, your body mass index should be between 19-25. Your Body mass index is 28.62 kg/m. If this is out of the aformentioned range listed, please consider follow up with your Primary Care Provider.   ________________________________________________________  The [facility] providers would like to encourage you to use MYCHART to communicate with providers for non-urgent requests or questions.  Due to long hold times on the telephone, sending your provider a message by [facility] may be a faster and more efficient way to get a response.  Please allow 48 business hours for a response.  Please remember that this is for non-urgent  requests.  _______________________________________________________  [facility] is using a team-based approach to care.  Your team is made up of your doctor and two to three APPS. Our APPS (Nurse Practitioners and Physician Assistants) work with your physician to ensure care continuity for you. They are fully qualified to address your health concerns and develop a treatment plan. They communicate directly with your gastroenterologist to care for you. Seeing the Advanced Practice Practitioners on your physician's team can help you by facilitating care more promptly, often allowing for earlier appointments, access to diagnostic testing, procedures, and other specialty referrals.   I appreciate the opportunity to care for you. Camie Furbish, PA

## 2024-05-24 ENCOUNTER — Telehealth: Payer: Self-pay | Admitting: Nurse Practitioner

## 2024-05-24 NOTE — Telephone Encounter (Signed)
 Contacted pt left  no vm to confirmed appt (per vr) phone not working

## 2024-05-26 ENCOUNTER — Ambulatory Visit: Admitting: Nurse Practitioner

## 2024-06-07 ENCOUNTER — Telehealth: Payer: Self-pay | Admitting: Nurse Practitioner

## 2024-06-07 NOTE — Telephone Encounter (Signed)
 Copied from CRM #[id number]. Topic: Clinical - Medication Question >> Jun 07, 2024  1:24 PM   Tiffini S wrote:  Reason for CRM: Patient wants to have all of her medications sent to [facility] [phone] patient had changes with insurance- no longer with [employer] and is with [employer]

## 2024-06-08 ENCOUNTER — Other Ambulatory Visit: Payer: Self-pay

## 2024-06-08 DIAGNOSIS — K5901 Slow transit constipation: Secondary | ICD-10-CM

## 2024-06-08 DIAGNOSIS — J302 Other seasonal allergic rhinitis: Secondary | ICD-10-CM

## 2024-06-08 DIAGNOSIS — E119 Type 2 diabetes mellitus without complications: Secondary | ICD-10-CM

## 2024-06-08 DIAGNOSIS — L209 Atopic dermatitis, unspecified: Secondary | ICD-10-CM

## 2024-06-08 DIAGNOSIS — E039 Hypothyroidism, unspecified: Secondary | ICD-10-CM

## 2024-06-08 MED ORDER — LEVOTHYROXINE SODIUM 175 MCG PO TABS: 175.0000 ug | ORAL_TABLET | Freq: Every day | ORAL | 0 refills | Status: AC

## 2024-06-08 MED ORDER — LINZESS 145 MCG PO CAPS: 145.0000 ug | ORAL_CAPSULE | Freq: Two times a day (BID) | ORAL | 0 refills | Status: AC

## 2024-06-08 MED ORDER — CETIRIZINE HCL 10 MG PO TABS: 10.0000 mg | ORAL_TABLET | Freq: Every day | ORAL | 0 refills | Status: AC

## 2024-06-08 MED ORDER — ATORVASTATIN CALCIUM 40 MG PO TABS: 40.0000 mg | ORAL_TABLET | Freq: Every day | ORAL | 0 refills | Status: AC

## 2024-06-08 MED ORDER — GLIMEPIRIDE 4 MG PO TABS: 4.0000 mg | ORAL_TABLET | Freq: Every day | ORAL | 0 refills | Status: AC

## 2024-06-08 MED ORDER — TRIAMCINOLONE ACETONIDE 0.025 % EX OINT: 1.0000 | TOPICAL_OINTMENT | Freq: Two times a day (BID) | CUTANEOUS | 0 refills | Status: AC

## 2024-06-08 NOTE — Telephone Encounter (Signed)
 Unable to reach patient by phone. Address is need to change pharmacy.

## 2024-06-08 NOTE — Telephone Encounter (Signed)
 Copied from CRM #[id number]. Topic: General - Phone/Fax/Address >> Jun 08, 2024 10:21 AM   Zy'onna H wrote:  Patient called in to give the address to u[facility] [address] [place], [place]  [address] [facility] Phone#: [phone] Please have PCP Theotis, fax over all requests ASAP

## 2024-06-08 NOTE — Telephone Encounter (Signed)
 Medications that have come form our office have been sent the new pharmacy for future refills.

## 2024-06-12 ENCOUNTER — Encounter: Payer: Self-pay | Admitting: Gastroenterology

## 2024-06-16 NOTE — Progress Notes (Unsigned)
 "  Office Visit Note  Patient: Melissa Harmon             Date of Birth: Mar 06, 1956           MRN: [id number]             PCP: Theotis Haze ORN, NP Referring: Theotis Haze ORN, NP Visit Date: 06/30/2024 Occupation: Switchboard  Subjective:  No chief complaint on file.   History of Present Illness: Melissa Harmon is a 69 y.o. female ***     Activities of Daily Living:  Patient reports morning stiffness for *** {minute/hour:19697}.   Patient {ACTIONS;DENIES/REPORTS:21021675::Denies} nocturnal pain.  Difficulty dressing/grooming: {ACTIONS;DENIES/REPORTS:21021675::Denies} Difficulty climbing stairs: {ACTIONS;DENIES/REPORTS:21021675::Denies} Difficulty getting out of chair: {ACTIONS;DENIES/REPORTS:21021675::Denies} Difficulty using hands for taps, buttons, cutlery, and/or writing: {ACTIONS;DENIES/REPORTS:21021675::Denies}  No Rheumatology ROS completed.   PMFS History:  Patient Active Problem List   Diagnosis Date Noted   Pain due to onychomycosis of toenails of both feet 05/15/2022   Anxiety 11/26/2021   Atopic dermatitis 11/26/2021   Irritable bowel syndrome with constipation 11/26/2021   Type 2 diabetes mellitus with hyperglycemia (HCC) 11/26/2021   Unspecified condition associated with female genital organs and menstrual cycle 11/26/2021   Diabetes mellitus with neuropathy (HCC) 06/07/2020   Diabetic neuropathy (HCC) 06/07/2020   Genetic testing 05/22/2020   Family history of colonic polyps    Family history of ovarian cancer    Family history of stomach cancer    Family history of lung cancer    Family history of breast cancer    Adjustment disorder with anxious mood 09/18/2017   Insomnia due to other mental disorder 09/18/2017   DYSURIA 10/25/2009   ABDOMINAL PAIN, RIGHT UPPER QUADRANT 10/25/2009   NECK PAIN, LEFT 08/23/2009   HAND PAIN, RIGHT 08/23/2009   ELBOW PAIN, BILATERAL 05/17/2009   Tobacco user 05/13/2009   DIABETES MELLITUS, TYPE II, WITHOUT  COMPLICATIONS 12/14/2008   DYSLIPIDEMIA 12/14/2008   SHORTNESS OF BREATH 12/14/2008   RECTAL BLEEDING 04/22/2007   History of colonic polyps 04/22/2007   BLOOD IN STOOL 04/15/2007   PITYRIASIS ROSEA 08/09/2006   HYPOTHYROIDISM, UNSPECIFIED 07/08/2006   Esophageal reflux 07/08/2006   FIBROMYALGIA, FIBROMYOSITIS 07/08/2006    Past Medical History:  Diagnosis Date   Bronchitis    Carpal tunnel syndrome on both sides    Family history of breast cancer    Family history of colonic polyps    Family history of lung cancer    Family history of ovarian cancer    Family history of stomach cancer    Fibromyalgia    GERD (gastroesophageal reflux disease)    History of chronic bronchitis    History of Hashimoto thyroiditis    s/p  bilateral thryoidectomy 03/ 2001   Hyperlipidemia    Hypothyroidism, postsurgical followed by pcp   08-04-1999 s/p  bilateral thyroidectomy for multinodular goitar (per path hashimoto's thyroiditis)   Irritable bowel syndrome with constipation    MDD (major depressive disorder)    Pelvic pain    Right ovarian cyst    Type 2 diabetes mellitus (HCC)    followed by pcp  (08-24-2019 pt stated checks blood sugar dialy in am,  fasting-- 159-175)   Wears contact lenses    Wears dentures    upper    Family History  Problem Relation Age of Onset   Diabetes Mother    Heart disease Mother    Stroke Mother    Colon cancer Father    Bipolar disorder Father  Colon polyps Father        unknown number   Ovarian cancer Sister 27       or other gynecologic cancer   Schizophrenia Brother    Heart disease Brother    Diverticulitis Maternal Aunt    Stomach cancer Maternal Grandmother        dx ?   Heart attack Maternal Grandmother    Lung cancer Cousin        dx 34s (maternal first cousin)   Breast cancer Cousin        dx 60s/70s (maternal first cousin)   Heart disease Half-Brother    Colon polyps Other 25       >80 polyps (6th degree relative - mother's  cousin's grandson)   Esophageal cancer Neg Hx    Rectal cancer Neg Hx    Past Surgical History:  Procedure Laterality Date   CESAREAN SECTION  1986   COLONOSCOPY  last one 05-25-2018   LAPAROSCOPIC UNILATERAL SALPINGO OOPHERECTOMY Bilateral 09/01/2019   Procedure: LAPAROSCOPIC BILATERAL SALPINGO OOPHORECTOMY;  Surgeon: Delana Ted Morrison, DO;  Location: [facility];  Service: Gynecology;  Laterality: Bilateral;   THYROIDECTOMY Bilateral 08-04-1999  dr adel  @[facility]    subtotal   UPPER GASTROINTESTINAL ENDOSCOPY     Social History[1] Social History   Social History Narrative   Social Hx:   Current living situation- lives in [place] alone   Born in [place] and raised in [place] by mom and dad   Siblings 3 living siblings but total 5 (pt is number the youngest)   Schooling- HS grad   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 Right Handed    Are you currently employed ? No    What is your current occupation? Retired    Do you live at home alone? Yes   Who lives with you?    What type of home do you live in: 1 story or 2 story? Lives in one story home          Immunization History  Administered Date(s) Administered   Fluzone Influenza virus vaccine,trivalent (IIV3), split virus 01/04/2013, 03/25/2014, 02/09/2020   Hep B, Unspecified 06/21/2018, 07/21/2018   Hepatitis B, ADULT 06/20/2018   Hepb-cpg 06/21/2018, 07/21/2018   Influenza Inj Mdck Quad Pf 02/22/2018, 02/25/2018   Influenza Split 03/10/2012   Influenza Whole 04/22/2007, 03/08/2009   Influenza, Seasonal, Injecte, Preservative Fre 04/02/2021   Influenza,inj,Quad PF,6+ Mos 03/16/2018   Influenza-Unspecified 01/10/2024   PFIZER(Purple Top)SARS-COV-2 Vaccination 07/21/2019, 08/11/2019, 04/15/2020   Polio, Unspecified 11/22/1958, 01/17/1959,  08/22/1959, 10/21/1967   Smallpox 01/31/1959   Td 06/27/2008   Td (Adult),unspecified 10/21/1967   Tdap 06/27/2008, 11/12/2020   Zoster Recombinant(Shingrix) 11/12/2020, 02/07/2021     Objective: Vital Signs: There were no vitals taken for this visit.   Physical Exam   Musculoskeletal Exam: ***  CDAI Exam: CDAI Score: -- Patient Global: --; Provider Global: -- Swollen: --; Tender: -- Joint Exam 06/30/2024   No joint exam has been documented for this visit   There is currently no information documented on the homunculus. Go to the Rheumatology activity and complete the homunculus joint exam.  Investigation: No additional findings.  Imaging: No results found.  Recent Labs: Lab Results  Component Value Date   WBC 8.8 02/14/2024  HGB 13.9 02/14/2024   PLT 250.0 02/14/2024   NA 143 02/14/2024   K 4.7 02/14/2024   CL 106 02/14/2024   CO2 22 02/14/2024   GLUCOSE 187 (H) 02/14/2024   BUN 11 02/14/2024   CREATININE 0.67 02/14/2024   BILITOT 0.5 02/14/2024   ALKPHOS 114 02/14/2024   AST 18 02/14/2024   ALT 24 02/14/2024   PROT 7.3 02/14/2024   ALBUMIN 4.2 02/14/2024   CALCIUM  9.5 02/14/2024   GFRAA 94 06/07/2020    Speciality Comments: No specialty comments available.  Procedures:  No procedures performed Allergies: Gabapentin, Metformin and related, Sulfa antibiotics, Sulfamethoxazole, and Other   Assessment / Plan:     Visit Diagnoses: No diagnosis found.  Orders: No orders of the defined types were placed in this encounter.  No orders of the defined types were placed in this encounter.   Face-to-face time spent with patient was *** minutes. Greater than 50% of time was spent in counseling and coordination of care.  Follow-Up Instructions: No follow-ups on file.   Daved JAYSON Gavel, CMA  Note - This record has been created using Animal nutritionist.  Chart creation errors have been sought, but may not always  have been located. Such creation errors do  not reflect on  the standard of medical care.    [1]  Social History Tobacco Use   Smoking status: Every Day    Current packs/day: 0.25    Average packs/day: 0.3 packs/day for 25.0 years (6.3 ttl pk-yrs)    Types: Cigarettes    Passive exposure: Current   Smokeless tobacco: Never   Tobacco comments:    6 cig per day  Vaping Use   Vaping status: Never Used  Substance Use Topics   Alcohol use: Yes    Alcohol/week: 6.0 standard drinks of alcohol    Types: 6 Cans of beer per week    Comment: ocasional glass of wine   Drug use: Never   "

## 2024-06-19 ENCOUNTER — Encounter: Admitting: Gastroenterology

## 2024-06-30 ENCOUNTER — Ambulatory Visit: Admitting: Rheumatology

## 2024-06-30 DIAGNOSIS — Z720 Tobacco use: Secondary | ICD-10-CM

## 2024-06-30 DIAGNOSIS — M19042 Primary osteoarthritis, left hand: Secondary | ICD-10-CM

## 2024-06-30 DIAGNOSIS — E785 Hyperlipidemia, unspecified: Secondary | ICD-10-CM

## 2024-06-30 DIAGNOSIS — E119 Type 2 diabetes mellitus without complications: Secondary | ICD-10-CM

## 2024-06-30 DIAGNOSIS — F5105 Insomnia due to other mental disorder: Secondary | ICD-10-CM

## 2024-06-30 DIAGNOSIS — Z8659 Personal history of other mental and behavioral disorders: Secondary | ICD-10-CM

## 2024-06-30 DIAGNOSIS — R202 Paresthesia of skin: Secondary | ICD-10-CM

## 2024-06-30 DIAGNOSIS — Z8261 Family history of arthritis: Secondary | ICD-10-CM

## 2024-06-30 DIAGNOSIS — M797 Fibromyalgia: Secondary | ICD-10-CM

## 2024-06-30 DIAGNOSIS — M7062 Trochanteric bursitis, left hip: Secondary | ICD-10-CM

## 2024-06-30 DIAGNOSIS — Z8719 Personal history of other diseases of the digestive system: Secondary | ICD-10-CM

## 2024-06-30 DIAGNOSIS — M7552 Bursitis of left shoulder: Secondary | ICD-10-CM

## 2024-06-30 DIAGNOSIS — Z8639 Personal history of other endocrine, nutritional and metabolic disease: Secondary | ICD-10-CM

## 2024-06-30 DIAGNOSIS — M19071 Primary osteoarthritis, right ankle and foot: Secondary | ICD-10-CM

## 2024-06-30 DIAGNOSIS — Z860100 Personal history of colon polyps, unspecified: Secondary | ICD-10-CM
